# Patient Record
Sex: Female | Born: 1950
Health system: Southern US, Community
[De-identification: ages and names within clinical notes are randomized; demographics above are authoritative.]

## PROBLEM LIST (undated history)

## (undated) DIAGNOSIS — F329 Major depressive disorder, single episode, unspecified: Secondary | ICD-10-CM

## (undated) DIAGNOSIS — E785 Hyperlipidemia, unspecified: Secondary | ICD-10-CM

## (undated) DIAGNOSIS — E559 Vitamin D deficiency, unspecified: Secondary | ICD-10-CM

## (undated) DIAGNOSIS — I10 Essential (primary) hypertension: Secondary | ICD-10-CM

## (undated) DIAGNOSIS — D649 Anemia, unspecified: Secondary | ICD-10-CM

## (undated) DIAGNOSIS — F32A Depression, unspecified: Secondary | ICD-10-CM

## (undated) HISTORY — DX: Major depressive disorder, single episode, unspecified: F32.9

## (undated) HISTORY — DX: Depression, unspecified: F32.A

## (undated) HISTORY — DX: Hyperlipidemia, unspecified: E78.5

## (undated) HISTORY — DX: Anemia, unspecified: D64.9

## (undated) HISTORY — DX: Vitamin D deficiency, unspecified: E55.9

## (undated) HISTORY — DX: Essential (primary) hypertension: I10

## (undated) HISTORY — PX: APPENDECTOMY: SHX54

## (undated) HISTORY — PX: MYOMECTOMY: SHX85

---

## 1976-08-07 HISTORY — PX: UTERINE FIBROID SURGERY: SHX826

## 2009-02-27 ENCOUNTER — Emergency Department: Payer: Self-pay | Admitting: Emergency Medicine

## 2010-05-09 ENCOUNTER — Ambulatory Visit: Payer: Self-pay | Admitting: Gastroenterology

## 2011-03-29 ENCOUNTER — Ambulatory Visit: Payer: Self-pay | Admitting: Unknown Physician Specialty

## 2012-06-07 ENCOUNTER — Ambulatory Visit: Payer: Self-pay | Admitting: Physician Assistant

## 2012-10-13 ENCOUNTER — Encounter: Payer: Self-pay | Admitting: Internal Medicine

## 2012-10-14 ENCOUNTER — Ambulatory Visit (INDEPENDENT_AMBULATORY_CARE_PROVIDER_SITE_OTHER): Payer: Federal, State, Local not specified - PPO | Admitting: Internal Medicine

## 2012-10-14 ENCOUNTER — Encounter: Payer: Self-pay | Admitting: Internal Medicine

## 2012-10-14 VITALS — BP 108/70 | HR 84 | Temp 97.6°F | Ht 65.0 in | Wt 196.2 lb

## 2012-10-14 DIAGNOSIS — D649 Anemia, unspecified: Secondary | ICD-10-CM

## 2012-10-14 DIAGNOSIS — R319 Hematuria, unspecified: Secondary | ICD-10-CM

## 2012-10-14 DIAGNOSIS — I10 Essential (primary) hypertension: Secondary | ICD-10-CM | POA: Insufficient documentation

## 2012-10-14 DIAGNOSIS — Z13220 Encounter for screening for lipoid disorders: Secondary | ICD-10-CM

## 2012-10-14 DIAGNOSIS — E559 Vitamin D deficiency, unspecified: Secondary | ICD-10-CM

## 2012-10-14 MED ORDER — AMLODIPINE BESYLATE 5 MG PO TABS: 5.0000 mg | ORAL_TABLET | Freq: Every day | ORAL | Status: DC

## 2012-10-14 MED ORDER — LISINOPRIL-HYDROCHLOROTHIAZIDE 20-25 MG PO TABS: 1.0000 | ORAL_TABLET | Freq: Every day | ORAL | Status: DC

## 2012-10-14 NOTE — Assessment & Plan Note (Signed)
Iron deficiency.  Check cbc.

## 2012-10-14 NOTE — Assessment & Plan Note (Signed)
Blood pressure - good control.  Same medi regimen.  Check metabolic panel.

## 2012-10-14 NOTE — Progress Notes (Signed)
  Subjective:    Patient ID: Janice Floyd, female    DOB: 1950/08/19, 61 y.o.   MRN: [id number]  HPI 62 year old female with past history of hypertension and vitamin D deficiency who comes in today to follow up on these issues as well as to establish care.  She states she is doing well.  Stays active.  No chest pain or tightness.  Breathing stable.  No acid reflux.  No nausea or vomiting.  Bowels stable.  She exercises three times per week.  Stopped having periods - age 32.  No bleeding or spotting since. Overall she feels good.    Past Medical History  Diagnosis Date  . Hypertension   . Vitamin D deficiency   . Anemia     iron deficient    Review of Systems Patient denies any headache, lightheadedness or dizziness.  No sinus symptoms.  No chest pain, tightness or palpitations.  No increased shortness of breath, cough or congestion.  No acid reflux.  No nausea or vomiting.  No abdominal pain or cramping.  No bowel change, such as diarrhea, constipation, BRBPR or melana.  No urine change.   No vaginal problems or bleeding.  Exercising three days per week.       Objective:   Physical Exam Filed Vitals:   10/14/12 1527  BP: 108/70  Pulse: 84  Temp: 97.6 F (36.4 C)   Blood pressure recheck: 22/24  62 year old female in no acute distress.   HEENT:  Nares- clear.  Oropharynx - without lesions. NECK:  Supple.  Nontender.  No audible bruit.  HEART:  Appears to be regular. LUNGS:  No crackles or wheezing audible.  Respirations even and unlabored.  RADIAL PULSE:  Equal bilaterally. ABDOMEN:  Soft, nontender.  Bowel sounds present and normal.  No audible abdominal bruit.   EXTREMITIES:  No increased edema present.  DP pulses palpable and equal bilaterally.          Assessment & Plan:  HEALTH MAINTENANCE.  Schedule a physical next visit.  Mammogram 06/07/12 - normal.  Colonoscopy 2012.  Pneumovax 2012.  Tetanus - 2011.

## 2012-10-14 NOTE — Assessment & Plan Note (Signed)
Check urinalysis. 

## 2012-10-14 NOTE — Assessment & Plan Note (Signed)
Taking vitamin D supplements.  Check vitamin D level.

## 2012-10-24 ENCOUNTER — Other Ambulatory Visit (INDEPENDENT_AMBULATORY_CARE_PROVIDER_SITE_OTHER): Payer: Federal, State, Local not specified - PPO

## 2012-10-24 DIAGNOSIS — R319 Hematuria, unspecified: Secondary | ICD-10-CM

## 2012-10-24 DIAGNOSIS — I10 Essential (primary) hypertension: Secondary | ICD-10-CM

## 2012-10-24 DIAGNOSIS — Z13220 Encounter for screening for lipoid disorders: Secondary | ICD-10-CM

## 2012-10-24 DIAGNOSIS — E559 Vitamin D deficiency, unspecified: Secondary | ICD-10-CM

## 2012-10-24 LAB — URINALYSIS, ROUTINE W REFLEX MICROSCOPIC
Bilirubin Urine: NEGATIVE
Leukocytes, UA: NEGATIVE
Nitrite: NEGATIVE
Urobilinogen, UA: 0.2 (ref 0.0–1.0)

## 2012-10-24 LAB — CBC WITH DIFFERENTIAL/PLATELET
Basophils Relative: 0.4 % (ref 0.0–3.0)
Eosinophils Absolute: 0.4 10*3/uL (ref 0.0–0.7)
Eosinophils Relative: 6.8 % — ABNORMAL HIGH (ref 0.0–5.0)
HCT: 37.3 % (ref 36.0–46.0)
Lymphs Abs: 2 10*3/uL (ref 0.7–4.0)
MCHC: 32.7 g/dL (ref 30.0–36.0)
MCV: 84.8 fl (ref 78.0–100.0)
Monocytes Absolute: 0.5 10*3/uL (ref 0.1–1.0)
Platelets: 250 10*3/uL (ref 150.0–400.0)
RBC: 4.4 Mil/uL (ref 3.87–5.11)
WBC: 6.1 10*3/uL (ref 4.5–10.5)

## 2012-10-24 LAB — COMPREHENSIVE METABOLIC PANEL
AST: 18 U/L (ref 0–37)
Albumin: 3.8 g/dL (ref 3.5–5.2)
Alkaline Phosphatase: 50 U/L (ref 39–117)
Glucose, Bld: 91 mg/dL (ref 70–99)
Potassium: 4.5 mEq/L (ref 3.5–5.1)
Sodium: 138 mEq/L (ref 135–145)
Total Bilirubin: 1 mg/dL (ref 0.3–1.2)
Total Protein: 7.6 g/dL (ref 6.0–8.3)

## 2012-10-24 LAB — LIPID PANEL
LDL Cholesterol: 112 mg/dL — ABNORMAL HIGH (ref 0–99)
Total CHOL/HDL Ratio: 3
VLDL: 7 mg/dL (ref 0.0–40.0)

## 2012-10-26 LAB — VITAMIN D 25 HYDROXY (VIT D DEFICIENCY, FRACTURES): Vit D, 25-Hydroxy: 49 ng/mL (ref 30–89)

## 2012-10-29 ENCOUNTER — Telehealth: Payer: Self-pay | Admitting: *Deleted

## 2012-10-29 NOTE — Telephone Encounter (Signed)
Called patient to let her know the form she dropped off is ready for her to pick up. Faxed form per patients request to [employer] [facility].

## 2012-10-31 ENCOUNTER — Other Ambulatory Visit: Payer: Self-pay | Admitting: Internal Medicine

## 2012-10-31 DIAGNOSIS — R319 Hematuria, unspecified: Secondary | ICD-10-CM

## 2012-10-31 NOTE — Progress Notes (Signed)
Follow up urinalysis and culture ordered.

## 2012-11-25 ENCOUNTER — Other Ambulatory Visit (INDEPENDENT_AMBULATORY_CARE_PROVIDER_SITE_OTHER): Payer: Federal, State, Local not specified - PPO

## 2012-11-25 DIAGNOSIS — R319 Hematuria, unspecified: Secondary | ICD-10-CM

## 2012-11-25 LAB — URINALYSIS, ROUTINE W REFLEX MICROSCOPIC
Bilirubin Urine: NEGATIVE
Ketones, ur: NEGATIVE
Nitrite: NEGATIVE
Total Protein, Urine: NEGATIVE
Urine Glucose: NEGATIVE
pH: 6 (ref 5.0–8.0)

## 2012-11-27 LAB — CULTURE, URINE COMPREHENSIVE: Organism ID, Bacteria: NO GROWTH

## 2012-12-03 ENCOUNTER — Encounter: Payer: Self-pay | Admitting: *Deleted

## 2012-12-05 ENCOUNTER — Telehealth: Payer: Self-pay

## 2012-12-05 NOTE — Telephone Encounter (Signed)
Patient left message on voicemail wanting her urine results. Informed patient that her her results were negative.

## 2013-01-10 ENCOUNTER — Encounter: Payer: Self-pay | Admitting: Internal Medicine

## 2013-01-10 ENCOUNTER — Other Ambulatory Visit (HOSPITAL_COMMUNITY)
Admission: RE | Admit: 2013-01-10 | Discharge: 2013-01-10 | Disposition: A | Discharge status: Home / Self Care | Payer: Federal, State, Local not specified - PPO | Source: Ambulatory Visit | Attending: Internal Medicine | Admitting: Internal Medicine

## 2013-01-10 ENCOUNTER — Ambulatory Visit (INDEPENDENT_AMBULATORY_CARE_PROVIDER_SITE_OTHER): Payer: Federal, State, Local not specified - PPO | Admitting: Internal Medicine

## 2013-01-10 VITALS — BP 100/70 | HR 76 | Temp 98.7°F | Ht 65.0 in | Wt 192.8 lb

## 2013-01-10 DIAGNOSIS — Z01419 Encounter for gynecological examination (general) (routine) without abnormal findings: Secondary | ICD-10-CM | POA: Insufficient documentation

## 2013-01-10 DIAGNOSIS — D649 Anemia, unspecified: Secondary | ICD-10-CM

## 2013-01-10 DIAGNOSIS — R319 Hematuria, unspecified: Secondary | ICD-10-CM

## 2013-01-10 DIAGNOSIS — Z1151 Encounter for screening for human papillomavirus (HPV): Secondary | ICD-10-CM | POA: Insufficient documentation

## 2013-01-10 DIAGNOSIS — Z124 Encounter for screening for malignant neoplasm of cervix: Secondary | ICD-10-CM

## 2013-01-10 DIAGNOSIS — M8580 Other specified disorders of bone density and structure, unspecified site: Secondary | ICD-10-CM

## 2013-01-10 DIAGNOSIS — I10 Essential (primary) hypertension: Secondary | ICD-10-CM

## 2013-01-10 DIAGNOSIS — E559 Vitamin D deficiency, unspecified: Secondary | ICD-10-CM

## 2013-01-10 DIAGNOSIS — M949 Disorder of cartilage, unspecified: Secondary | ICD-10-CM

## 2013-01-10 NOTE — Progress Notes (Signed)
  Subjective:    Patient ID: Janice Floyd, female    DOB: 1951-04-21, 62 y.o.   MRN: [id number]  HPI 62 year old female with past history of hypertension and vitamin D deficiency who comes in today to follow up on these issues as well as for a complete physical exam.  She states she is doing well.  Stays active.  No chest pain or tightness.  Breathing stable.  No acid reflux.  No nausea or vomiting.  Bowels stable.  She exercises three times per week.  Stopped having periods - age 30.  No bleeding or spotting since. Overall she feels good.    Past Medical History  Diagnosis Date  . Hypertension   . Vitamin D deficiency   . Anemia     iron deficient    Current Outpatient Prescriptions on File Prior to Visit  Medication Sig Dispense Refill  . amLODipine (NORVASC) 5 MG tablet Take 1 tablet (5 mg total) by mouth daily.  30 tablet  5  . Cholecalciferol (VITAMIN D-3) 1000 UNITS CAPS Take 1 capsule by mouth daily.      Marland Kitchen lisinopril-hydrochlorothiazide (PRINZIDE,ZESTORETIC) 20-25 MG per tablet Take 1 tablet by mouth daily.  30 tablet  5   No current facility-administered medications on file prior to visit.    Review of Systems Patient denies any headache, lightheadedness or dizziness.  No sinus symptoms.  No chest pain, tightness or palpitations.  No increased shortness of breath, cough or congestion.  No acid reflux.  No nausea or vomiting.  No abdominal pain or cramping.  No bowel change, such as diarrhea, constipation, BRBPR or melana.  No urine change.   No vaginal problems or bleeding.  Exercising three days per week.  Overall feels good.      Objective:   Physical Exam  Filed Vitals:   01/10/13 0834  BP: 100/70  Pulse: 76  Temp: 98.7 F (37.1 C)   Blood pressure recheck: 110/72, pulse 29  62 year old female in no acute distress.   HEENT:  Nares- clear.  Oropharynx - without lesions. NECK:  Supple.  Nontender.  No audible bruit.  HEART:  Appears to be regular. LUNGS:  No  crackles or wheezing audible.  Respirations even and unlabored.  RADIAL PULSE:  Equal bilaterally.    BREASTS:  No nipple discharge or nipple retraction present.  Could not appreciate any distinct nodules or axillary adenopathy.  ABDOMEN:  Soft, nontender.  Bowel sounds present and normal.  No audible abdominal bruit.  GU:  Normal external genitalia.  Vaginal vault without lesions.  Cervix identified.  Pap performed. Could not appreciate any adnexal masses or tenderness.   RECTAL:  Heme negative.   EXTREMITIES:  No increased edema present.  DP pulses palpable and equal bilaterally.          Assessment & Plan:  HEALTH MAINTENANCE.  Physical today.  Mammogram 06/07/12 - normal.  Colonoscopy 2012.  Pneumovax 2012.  Tetanus - 2011.

## 2013-01-12 ENCOUNTER — Encounter: Payer: Self-pay | Admitting: Internal Medicine

## 2013-01-12 DIAGNOSIS — M8580 Other specified disorders of bone density and structure, unspecified site: Secondary | ICD-10-CM | POA: Insufficient documentation

## 2013-01-12 NOTE — Assessment & Plan Note (Signed)
Blood pressure - good control.  Same med regimen.  Follow metabolic panel.    

## 2013-01-12 NOTE — Assessment & Plan Note (Signed)
Urinalysis 11/25/12 - no blood.     

## 2013-01-12 NOTE — Assessment & Plan Note (Signed)
Bone density 2013 revealed osteopenia.  Continue calcium and vitamin D and weight bearing exercise.

## 2013-01-12 NOTE — Assessment & Plan Note (Addendum)
Taking vitamin D supplements.  Follow vitamin D level.  Vitamin D level wnl (49) - 10/24/12.

## 2013-01-12 NOTE — Assessment & Plan Note (Addendum)
Iron deficiency.  Follow cbc.  Hgb 10/24/12 - normal.

## 2013-01-15 ENCOUNTER — Encounter: Payer: Self-pay | Admitting: *Deleted

## 2013-04-15 ENCOUNTER — Telehealth: Payer: Self-pay | Admitting: Internal Medicine

## 2013-04-15 MED ORDER — AMLODIPINE BESYLATE 5 MG PO TABS: 5.0000 mg | ORAL_TABLET | Freq: Every day | ORAL | Status: DC

## 2013-04-15 MED ORDER — LISINOPRIL-HYDROCHLOROTHIAZIDE 20-25 MG PO TABS: 1.0000 | ORAL_TABLET | Freq: Every day | ORAL | Status: DC

## 2013-04-15 NOTE — Telephone Encounter (Signed)
Refilled amlodipine 5mg  #30 with 5 refills and lisinopril/hctz 220/25 #30 with 5 refills.

## 2013-06-17 ENCOUNTER — Ambulatory Visit: Payer: Self-pay | Admitting: Internal Medicine

## 2013-06-17 ENCOUNTER — Encounter: Payer: Self-pay | Admitting: Internal Medicine

## 2013-06-17 LAB — HM MAMMOGRAPHY: HM Mammogram: NEGATIVE

## 2013-06-26 ENCOUNTER — Other Ambulatory Visit: Payer: Self-pay | Admitting: Internal Medicine

## 2013-07-10 ENCOUNTER — Other Ambulatory Visit (INDEPENDENT_AMBULATORY_CARE_PROVIDER_SITE_OTHER): Payer: Federal, State, Local not specified - PPO

## 2013-07-10 DIAGNOSIS — I10 Essential (primary) hypertension: Secondary | ICD-10-CM

## 2013-07-10 LAB — BASIC METABOLIC PANEL
CO2: 31 mEq/L (ref 19–32)
Calcium: 9.4 mg/dL (ref 8.4–10.5)
Creatinine, Ser: 1 mg/dL (ref 0.4–1.2)
GFR: 71.63 mL/min (ref >60.00)

## 2013-07-10 LAB — LIPID PANEL
Total CHOL/HDL Ratio: 3
Triglycerides: 43 mg/dL (ref 0.0–149.0)

## 2013-07-15 ENCOUNTER — Encounter (INDEPENDENT_AMBULATORY_CARE_PROVIDER_SITE_OTHER): Payer: Self-pay

## 2013-07-15 ENCOUNTER — Ambulatory Visit (INDEPENDENT_AMBULATORY_CARE_PROVIDER_SITE_OTHER): Payer: Federal, State, Local not specified - PPO | Admitting: Internal Medicine

## 2013-07-15 ENCOUNTER — Encounter: Payer: Self-pay | Admitting: Internal Medicine

## 2013-07-15 VITALS — BP 110/70 | HR 79 | Temp 98.3°F | Ht 65.0 in | Wt 192.5 lb

## 2013-07-15 DIAGNOSIS — I10 Essential (primary) hypertension: Secondary | ICD-10-CM

## 2013-07-15 DIAGNOSIS — M8580 Other specified disorders of bone density and structure, unspecified site: Secondary | ICD-10-CM

## 2013-07-15 DIAGNOSIS — Z13220 Encounter for screening for lipoid disorders: Secondary | ICD-10-CM

## 2013-07-15 DIAGNOSIS — R319 Hematuria, unspecified: Secondary | ICD-10-CM

## 2013-07-15 DIAGNOSIS — D649 Anemia, unspecified: Secondary | ICD-10-CM

## 2013-07-15 DIAGNOSIS — M899 Disorder of bone, unspecified: Secondary | ICD-10-CM

## 2013-07-15 DIAGNOSIS — E559 Vitamin D deficiency, unspecified: Secondary | ICD-10-CM

## 2013-07-15 DIAGNOSIS — Z9109 Other allergy status, other than to drugs and biological substances: Secondary | ICD-10-CM

## 2013-07-15 MED ORDER — FLUTICASONE PROPIONATE 50 MCG/ACT NA SUSP: 2.0000 | Freq: Every day | NASAL | Status: DC

## 2013-07-15 NOTE — Progress Notes (Signed)
  Subjective:    Patient ID: Janice Floyd, female    DOB: 29-Jul-1951, 62 y.o.   MRN: [id number]  HPI 62 year old female with past history of hypertension and vitamin D deficiency who comes in today for a scheduled follow up.  She states she is doing well.  Stays active.  No chest pain or tightness.  Breathing stable.  No acid reflux.  No nausea or vomiting.  Bowels stable.  She exercises three times per week.  Stopped having periods - age 74.  No bleeding or spotting since. Overall she feels good.  Does report some post nasal drainage.      Past Medical History  Diagnosis Date  . Hypertension   . Vitamin D deficiency   . Anemia     iron deficient    Current Outpatient Prescriptions on File Prior to Visit  Medication Sig Dispense Refill  . amLODipine (NORVASC) 5 MG tablet Take 1 tablet (5 mg total) by mouth daily.  30 tablet  5  . Cholecalciferol (VITAMIN D-3) 1000 UNITS CAPS Take 1 capsule by mouth daily.      Marland Kitchen lisinopril-hydrochlorothiazide (PRINZIDE,ZESTORETIC) 20-25 MG per tablet Take 1 tablet by mouth daily.  30 tablet  5   No current facility-administered medications on file prior to visit.    Review of Systems Patient denies any headache, lightheadedness or dizziness.  Does report some drainage.  No chest pain, tightness or palpitations.  No increased shortness of breath, cough or congestion.  No acid reflux.  No nausea or vomiting.  No abdominal pain or cramping.  No bowel change, such as diarrhea, constipation, BRBPR or melana.  No urine change.   No vaginal problems or bleeding.  Exercising three days per week.  Overall feels good.      Objective:   Physical Exam  Filed Vitals:   07/15/13 0817  BP: 110/70  Pulse: 79  Temp: 98.3 F (36.8 C)   Pulse 49  62 year old female in no acute distress.   HEENT:  Nares- clear.  Oropharynx - without lesions. NECK:  Supple.  Nontender.  No audible bruit.  HEART:  Appears to be regular. LUNGS:  No crackles or wheezing audible.   Respirations even and unlabored.  RADIAL PULSE:  Equal bilaterally.   ABDOMEN:  Soft, nontender.  Bowel sounds present and normal.  No audible abdominal bruit.   EXTREMITIES:  No increased edema present.  DP pulses palpable and equal bilaterally.          Assessment & Plan:  HEALTH MAINTENANCE.  Physical 01/10/13..  Mammogram 06/17/13 - Birads I.  Colonoscopy 2012.  Pneumovax 2012.  Tetanus - 2011.

## 2013-07-15 NOTE — Progress Notes (Signed)
Pre-visit discussion using our clinic review tool. No additional management support is needed unless otherwise documented below in the visit note.  

## 2013-07-19 ENCOUNTER — Encounter: Payer: Self-pay | Admitting: Internal Medicine

## 2013-07-19 DIAGNOSIS — Z9109 Other allergy status, other than to drugs and biological substances: Secondary | ICD-10-CM | POA: Insufficient documentation

## 2013-07-19 NOTE — Assessment & Plan Note (Signed)
Bone density 2013 revealed osteopenia.  Continue vitamin D and weight bearing exercise.    

## 2013-07-19 NOTE — Assessment & Plan Note (Signed)
Taking vitamin D supplements.  Follow vitamin D level.  Vitamin D level wnl (49) - 10/24/12.

## 2013-07-19 NOTE — Assessment & Plan Note (Signed)
With increased drainage.  Fonase as directed.  Follow.

## 2013-07-19 NOTE — Assessment & Plan Note (Signed)
Urinalysis 11/25/12 - no blood.     

## 2013-07-19 NOTE — Assessment & Plan Note (Signed)
Blood pressure - good control.  Same med regimen.  Follow metabolic panel.    

## 2013-07-19 NOTE — Assessment & Plan Note (Signed)
Iron deficiency.  Follow cbc.  Hgb 10/24/12 - normal.

## 2013-10-10 ENCOUNTER — Other Ambulatory Visit: Payer: Self-pay | Admitting: Internal Medicine

## 2013-12-05 ENCOUNTER — Other Ambulatory Visit: Payer: Self-pay | Admitting: Internal Medicine

## 2014-01-08 ENCOUNTER — Encounter (INDEPENDENT_AMBULATORY_CARE_PROVIDER_SITE_OTHER): Payer: Self-pay

## 2014-01-08 ENCOUNTER — Other Ambulatory Visit (INDEPENDENT_AMBULATORY_CARE_PROVIDER_SITE_OTHER): Payer: Medicare Other

## 2014-01-08 DIAGNOSIS — E559 Vitamin D deficiency, unspecified: Secondary | ICD-10-CM | POA: Diagnosis not present

## 2014-01-08 DIAGNOSIS — M8580 Other specified disorders of bone density and structure, unspecified site: Secondary | ICD-10-CM

## 2014-01-08 DIAGNOSIS — I10 Essential (primary) hypertension: Secondary | ICD-10-CM | POA: Diagnosis not present

## 2014-01-08 DIAGNOSIS — M899 Disorder of bone, unspecified: Secondary | ICD-10-CM | POA: Diagnosis not present

## 2014-01-08 DIAGNOSIS — D649 Anemia, unspecified: Secondary | ICD-10-CM | POA: Diagnosis not present

## 2014-01-08 DIAGNOSIS — Z13220 Encounter for screening for lipoid disorders: Secondary | ICD-10-CM | POA: Diagnosis not present

## 2014-01-08 DIAGNOSIS — M949 Disorder of cartilage, unspecified: Secondary | ICD-10-CM

## 2014-01-08 LAB — COMPREHENSIVE METABOLIC PANEL
ALBUMIN: 3.8 g/dL (ref 3.5–5.2)
ALT: 11 U/L (ref 0–35)
AST: 18 U/L (ref 0–37)
Alkaline Phosphatase: 50 U/L (ref 39–117)
BUN: 13 mg/dL (ref 6–23)
CALCIUM: 9.5 mg/dL (ref 8.4–10.5)
CHLORIDE: 102 meq/L (ref 96–112)
CO2: 31 meq/L (ref 19–32)
Creatinine, Ser: 1.1 mg/dL (ref 0.4–1.2)
GFR: 66.15 mL/min (ref >60.00)
Glucose, Bld: 89 mg/dL (ref 70–99)
Potassium: 3.9 mEq/L (ref 3.5–5.1)
Sodium: 139 mEq/L (ref 135–145)
Total Bilirubin: 0.7 mg/dL (ref 0.2–1.2)
Total Protein: 7.6 g/dL (ref 6.0–8.3)

## 2014-01-08 LAB — CBC WITH DIFFERENTIAL/PLATELET
BASOS ABS: 0 10*3/uL (ref 0.0–0.1)
BASOS PCT: 0.7 % (ref 0.0–3.0)
Eosinophils Absolute: 0.4 10*3/uL (ref 0.0–0.7)
Eosinophils Relative: 6 % — ABNORMAL HIGH (ref 0.0–5.0)
HCT: 40.2 % (ref 36.0–46.0)
HEMOGLOBIN: 13 g/dL (ref 12.0–15.0)
LYMPHS PCT: 29.8 % (ref 12.0–46.0)
Lymphs Abs: 2.1 10*3/uL (ref 0.7–4.0)
MCHC: 32.3 g/dL (ref 30.0–36.0)
MCV: 86.6 fl (ref 78.0–100.0)
MONOS PCT: 8.3 % (ref 3.0–12.0)
Monocytes Absolute: 0.6 10*3/uL (ref 0.1–1.0)
Neutro Abs: 3.8 10*3/uL (ref 1.4–7.7)
Neutrophils Relative %: 55.2 % (ref 43.0–77.0)
Platelets: 266 10*3/uL (ref 150.0–400.0)
RBC: 4.64 Mil/uL (ref 3.87–5.11)
RDW: 15.1 % (ref 11.5–15.5)
WBC: 6.9 10*3/uL (ref 4.0–10.5)

## 2014-01-08 LAB — LIPID PANEL
CHOL/HDL RATIO: 3
Cholesterol: 189 mg/dL (ref 0–200)
HDL: 63.4 mg/dL (ref >39.00)
LDL Cholesterol: 116 mg/dL — ABNORMAL HIGH (ref 0–99)
NonHDL: 125.6
TRIGLYCERIDES: 47 mg/dL (ref 0.0–149.0)
VLDL: 9.4 mg/dL (ref 0.0–40.0)

## 2014-01-08 LAB — TSH: TSH: 1.38 u[IU]/mL (ref 0.35–4.50)

## 2014-01-09 LAB — VITAMIN D 25 HYDROXY (VIT D DEFICIENCY, FRACTURES): VIT D 25 HYDROXY: 52 ng/mL (ref 30–89)

## 2014-01-13 ENCOUNTER — Ambulatory Visit (INDEPENDENT_AMBULATORY_CARE_PROVIDER_SITE_OTHER): Payer: Medicare Other | Admitting: Internal Medicine

## 2014-01-13 ENCOUNTER — Encounter: Payer: Self-pay | Admitting: Internal Medicine

## 2014-01-13 VITALS — BP 110/60 | HR 80 | Temp 98.0°F | Ht 65.0 in | Wt 192.2 lb

## 2014-01-13 DIAGNOSIS — F439 Reaction to severe stress, unspecified: Secondary | ICD-10-CM

## 2014-01-13 DIAGNOSIS — M899 Disorder of bone, unspecified: Secondary | ICD-10-CM

## 2014-01-13 DIAGNOSIS — Z733 Stress, not elsewhere classified: Secondary | ICD-10-CM

## 2014-01-13 DIAGNOSIS — I10 Essential (primary) hypertension: Secondary | ICD-10-CM | POA: Diagnosis not present

## 2014-01-13 DIAGNOSIS — D649 Anemia, unspecified: Secondary | ICD-10-CM

## 2014-01-13 DIAGNOSIS — R319 Hematuria, unspecified: Secondary | ICD-10-CM

## 2014-01-13 DIAGNOSIS — M8580 Other specified disorders of bone density and structure, unspecified site: Secondary | ICD-10-CM

## 2014-01-13 DIAGNOSIS — E559 Vitamin D deficiency, unspecified: Secondary | ICD-10-CM | POA: Diagnosis not present

## 2014-01-13 DIAGNOSIS — Z9109 Other allergy status, other than to drugs and biological substances: Secondary | ICD-10-CM

## 2014-01-13 DIAGNOSIS — M949 Disorder of cartilage, unspecified: Secondary | ICD-10-CM

## 2014-01-13 DIAGNOSIS — Z1239 Encounter for other screening for malignant neoplasm of breast: Secondary | ICD-10-CM

## 2014-01-13 NOTE — Patient Instructions (Signed)
Ellin Saba - ( phone [phone])

## 2014-01-13 NOTE — Progress Notes (Signed)
Pre visit review using our clinic review tool, if applicable. No additional management support is needed unless otherwise documented below in the visit note. 

## 2014-01-18 ENCOUNTER — Encounter: Payer: Self-pay | Admitting: Internal Medicine

## 2014-01-18 DIAGNOSIS — F439 Reaction to severe stress, unspecified: Secondary | ICD-10-CM | POA: Insufficient documentation

## 2014-01-18 NOTE — Assessment & Plan Note (Signed)
Iron deficiency.  Follow cbc.

## 2014-01-18 NOTE — Assessment & Plan Note (Signed)
Urinalysis 11/25/12 - no blood.

## 2014-01-18 NOTE — Assessment & Plan Note (Signed)
Increased stress.  She asked for a name and number to a counselor.  Desired no further discussion today.  Gave her Donnetta Hail name and phone number.  She is to notify me if any problems.

## 2014-01-18 NOTE — Progress Notes (Signed)
  Subjective:    Patient ID: Janice Floyd, female    DOB: 20-May-1951, 63 y.o.   MRN: [id number]  HPI 63 year old female with past history of hypertension and vitamin D deficiency who comes in today to follow up on these issues as well as for a complete physical exam.   She states she is doing well.  Stays active.  No chest pain or tightness.  Breathing stable.  No acid reflux.  No nausea or vomiting.  Bowels stable.  She exercises three times per week.  Stopped having periods - age 58.  No bleeding or spotting since. Overall she feels good.       Past Medical History  Diagnosis Date  . Hypertension   . Vitamin D deficiency   . Anemia     iron deficient    Current Outpatient Prescriptions on File Prior to Visit  Medication Sig Dispense Refill  . amLODipine (NORVASC) 5 MG tablet Take 1 tablet (5 mg total) by mouth daily.  30 tablet  5  . Cholecalciferol (VITAMIN D-3) 1000 UNITS CAPS Take 1 capsule by mouth daily.      . fluticasone (FLONASE) 50 MCG/ACT nasal spray USE 2 SPRAYS IN BOTH NOSTRILS EVERY DAY  16 g  5  . lisinopril-hydrochlorothiazide (PRINZIDE,ZESTORETIC) 20-25 MG per tablet TAKE 1 TABLET BY MOUTH DAILY.  30 tablet  5   No current facility-administered medications on file prior to visit.    Review of Systems Patient denies any headache, lightheadedness or dizziness.  No significant sinus or allergy symptoms.  No chest pain, tightness or palpitations.  No increased shortness of breath, cough or congestion.  No acid reflux.  No nausea or vomiting.  No abdominal pain or cramping.  No bowel change, such as diarrhea, constipation, BRBPR or melana.  No urine change.   No vaginal problems or bleeding.  Exercising three days per week.  Overall feels good.      Objective:   Physical Exam  Filed Vitals:   01/13/14 1028  BP: 110/60  Pulse: 80  Temp: 98 F (8.33 C)   63 year old female in no acute distress.   HEENT:  Nares- clear.  Oropharynx - without lesions. NECK:  Supple.   Nontender.  No audible bruit.  HEART:  Appears to be regular. LUNGS:  No crackles or wheezing audible.  Respirations even and unlabored.  RADIAL PULSE:  Equal bilaterally.    BREASTS:  No nipple discharge or nipple retraction present.  Could not appreciate any distinct nodules or axillary adenopathy.  ABDOMEN:  Soft, nontender.  Bowel sounds present and normal.  No audible abdominal bruit.  GU:  Not performed.     EXTREMITIES:  No increased edema present.  DP pulses palpable and equal bilaterally.          Assessment & Plan:  HEALTH MAINTENANCE.  Physical today..  Mammogram 06/17/13 - Birads I.  Colonoscopy 2012.  Pneumovax 2012.  Tetanus - 2011.

## 2014-01-18 NOTE — Assessment & Plan Note (Signed)
Blood pressure - good control.  Same med regimen.  Follow metabolic panel.

## 2014-01-18 NOTE — Assessment & Plan Note (Signed)
No allergy symptoms reported today.  flonase as directed.  Follow.

## 2014-01-18 NOTE — Assessment & Plan Note (Signed)
Bone density 2013 revealed osteopenia.  Continue vitamin D and weight bearing exercise.

## 2014-01-18 NOTE — Assessment & Plan Note (Signed)
Taking vitamin D supplements.  Follow vitamin D level.  

## 2014-02-20 ENCOUNTER — Telehealth: Payer: Self-pay | Admitting: *Deleted

## 2014-02-20 MED ORDER — ZOSTER VACCINE LIVE 19400 UNT/0.65ML ~~LOC~~ SOLR: 0.6500 mL | Freq: Once | SUBCUTANEOUS | Status: DC

## 2014-02-20 NOTE — Telephone Encounter (Signed)
I will print an Rx for this. I do not see any contraindication in the chart.

## 2014-02-20 NOTE — Telephone Encounter (Signed)
Pt called requesting Zostavax Rx to be written so she may take it to her local pharmacy for administration.  Please advise in Dr Lars Mage absence

## 2014-02-20 NOTE — Telephone Encounter (Signed)
Left message on pts identified VM, Rx ready for pick up.

## 2014-02-22 ENCOUNTER — Telehealth: Payer: Self-pay | Admitting: Internal Medicine

## 2014-02-22 ENCOUNTER — Encounter (HOSPITAL_COMMUNITY): Payer: Self-pay | Admitting: Emergency Medicine

## 2014-02-22 ENCOUNTER — Emergency Department (HOSPITAL_COMMUNITY)
Admission: EM | Admit: 2014-02-22 | Discharge: 2014-02-22 | Disposition: A | Discharge status: Home / Self Care | Payer: Medicare Other | Attending: Emergency Medicine | Admitting: Emergency Medicine

## 2014-02-22 DIAGNOSIS — R45851 Suicidal ideations: Secondary | ICD-10-CM | POA: Insufficient documentation

## 2014-02-22 DIAGNOSIS — F329 Major depressive disorder, single episode, unspecified: Secondary | ICD-10-CM | POA: Diagnosis not present

## 2014-02-22 DIAGNOSIS — E559 Vitamin D deficiency, unspecified: Secondary | ICD-10-CM | POA: Insufficient documentation

## 2014-02-22 DIAGNOSIS — F3289 Other specified depressive episodes: Secondary | ICD-10-CM | POA: Insufficient documentation

## 2014-02-22 DIAGNOSIS — I10 Essential (primary) hypertension: Secondary | ICD-10-CM | POA: Insufficient documentation

## 2014-02-22 DIAGNOSIS — Z87891 Personal history of nicotine dependence: Secondary | ICD-10-CM | POA: Insufficient documentation

## 2014-02-22 DIAGNOSIS — Z862 Personal history of diseases of the blood and blood-forming organs and certain disorders involving the immune mechanism: Secondary | ICD-10-CM | POA: Diagnosis not present

## 2014-02-22 DIAGNOSIS — IMO0002 Reserved for concepts with insufficient information to code with codable children: Secondary | ICD-10-CM | POA: Insufficient documentation

## 2014-02-22 DIAGNOSIS — Z79899 Other long term (current) drug therapy: Secondary | ICD-10-CM | POA: Insufficient documentation

## 2014-02-22 DIAGNOSIS — F32A Depression, unspecified: Secondary | ICD-10-CM

## 2014-02-22 LAB — COMPREHENSIVE METABOLIC PANEL
ALT: 10 U/L (ref 0–35)
ANION GAP: 12 (ref 5–15)
AST: 19 U/L (ref 0–37)
Albumin: 3.9 g/dL (ref 3.5–5.2)
Alkaline Phosphatase: 66 U/L (ref 39–117)
BILIRUBIN TOTAL: 0.6 mg/dL (ref 0.3–1.2)
BUN: 13 mg/dL (ref 6–23)
CALCIUM: 10 mg/dL (ref 8.4–10.5)
CHLORIDE: 102 meq/L (ref 96–112)
CO2: 28 meq/L (ref 19–32)
CREATININE: 1.08 mg/dL (ref 0.50–1.10)
GFR, EST AFRICAN AMERICAN: 61 mL/min — AB (ref >90)
GFR, EST NON AFRICAN AMERICAN: 53 mL/min — AB (ref >90)
GLUCOSE: 88 mg/dL (ref 70–99)
Potassium: 4.4 mEq/L (ref 3.7–5.3)
Sodium: 142 mEq/L (ref 137–147)
Total Protein: 8.2 g/dL (ref 6.0–8.3)

## 2014-02-22 LAB — CBC
HEMATOCRIT: 40.4 % (ref 36.0–46.0)
Hemoglobin: 13 g/dL (ref 12.0–15.0)
MCH: 27.8 pg (ref 26.0–34.0)
MCHC: 32.2 g/dL (ref 30.0–36.0)
MCV: 86.3 fL (ref 78.0–100.0)
Platelets: 265 10*3/uL (ref 150–400)
RBC: 4.68 MIL/uL (ref 3.87–5.11)
RDW: 14.3 % (ref 11.5–15.5)
WBC: 7.3 10*3/uL (ref 4.0–10.5)

## 2014-02-22 LAB — RAPID URINE DRUG SCREEN, HOSP PERFORMED
Amphetamines: NOT DETECTED
Barbiturates: NOT DETECTED
Benzodiazepines: NOT DETECTED
COCAINE: NOT DETECTED
OPIATES: NOT DETECTED
Tetrahydrocannabinol: NOT DETECTED

## 2014-02-22 LAB — ETHANOL: Alcohol, Ethyl (B): <11 mg/dL (ref 0–11)

## 2014-02-22 MED ORDER — ONDANSETRON HCL 4 MG PO TABS: 4.0000 mg | ORAL_TABLET | Freq: Three times a day (TID) | ORAL | Status: DC | PRN

## 2014-02-22 MED ORDER — IBUPROFEN 200 MG PO TABS: 600.0000 mg | ORAL_TABLET | Freq: Three times a day (TID) | ORAL | Status: DC | PRN

## 2014-02-22 MED ORDER — ACETAMINOPHEN 325 MG PO TABS: 650.0000 mg | ORAL_TABLET | ORAL | Status: DC | PRN

## 2014-02-22 MED ORDER — ALUM & MAG HYDROXIDE-SIMETH 200-200-20 MG/5ML PO SUSP: 30.0000 mL | ORAL | Status: DC | PRN

## 2014-02-22 MED ORDER — LORAZEPAM 1 MG PO TABS: 1.0000 mg | ORAL_TABLET | Freq: Three times a day (TID) | ORAL | Status: DC | PRN

## 2014-02-22 MED ORDER — ZOLPIDEM TARTRATE 5 MG PO TABS: 5.0000 mg | ORAL_TABLET | Freq: Every evening | ORAL | Status: DC | PRN

## 2014-02-22 NOTE — ED Notes (Signed)
TTS at bedside. 

## 2014-02-22 NOTE — Discharge Instructions (Signed)
Use the resources provided to follow up for further evaluation and management of your symptoms.

## 2014-02-22 NOTE — ED Notes (Signed)
Pt c/o increased depression and mood swings x "a couple weeks."  Pt husband reports that the Pt stated earlier today that "she doesn't have anything to live for."  Pt reports SI w/o a plan.  Denies HI/ hallucinations.

## 2014-02-22 NOTE — Telephone Encounter (Signed)
I received a notification from the ER.  Pt seen this weekend with depression and suicidal ideations.  She spoke to counselor in [place].  Need to make sure pt doing ok and that she has f/u with psych.  Let me know if ok and if needs anything.    Thanks

## 2014-02-22 NOTE — BH Assessment (Signed)
Assessment Note  Janice Floyd is an 63 y.o. female. Patient was brought into the ED by her husband because of an increased symptoms of depression and suicide ideations no plan.  Patient is currently denying SI/HI, hallucinations, and other self-injurious behaviors.  Patient is reporting some relationship concerns with her husband of 35 years being a contributing factor in her current emotions.  Patient sent the husband a message identifying her potential failures in her marriage and to her family then suggested he would be happier with another person. Patient reports in the last 4 months her symptoms of depression increased including mood swings.  The husband reports in the past 2 months the patient was witnessed in the closet crying and this is what motivated him to get her a therapist.  Patient is currently participating with therapy with [facility] but seen the therapist for two sessions.  The husband suggest that their life is peaceful and they are not financially overwhelmed in anyway so the problems are the patient's personal feelings she would need to handle.  The patient is suggesting that they are having some marital problems and need counseling together.  The patient denies a history of treatment or symptoms of depression until 4 months ago.    CSW ran patient with Heloise Purpura, NP it is recommended to refer to outpatient provider including a psychiatrist for medication evaluation.  CSW provided the patient with other outpatient referral for medication evaluation if needed.    Axis I: Major Depression, single episode Axis II: Deferred Axis III:  Past Medical History  Diagnosis Date  . Hypertension   . Vitamin D deficiency   . Anemia     iron deficient   Axis IV: economic problems, other psychosocial or environmental problems, problems related to social environment, problems with access to health care services and problems with primary support group Axis V: 51-60 moderate  symptoms  Past Medical History:  Past Medical History  Diagnosis Date  . Hypertension   . Vitamin D deficiency   . Anemia     iron deficient    Past Surgical History  Procedure Laterality Date  . Appendectomy    . Myomectomy      x2    Family History:  Family History  Problem Relation Age of Onset  . Lung cancer Father   . Heart disease Mother     congestive heart failure  . Hypertension Mother   . Lung cancer Brother   . Diabetes Brother   . Colon polyps Sister     Social History:  reports that she has quit smoking. She has never used smokeless tobacco. She reports that she drinks alcohol. She reports that she does not use illicit drugs.  Additional Social History:     CIWA: CIWA-Ar BP: 119/79 mmHg Pulse Rate: 68 COWS:    Allergies: No Known Allergies  Home Medications:  (Not in a hospital admission)  OB/GYN Status:  Patient's last menstrual period was 10/15/1999.  General Assessment Data Location of Assessment: [facility] ED ACT Assessment: Yes Is this a Tele or Face-to-Face Assessment?: Face-to-Face Is this an Initial Assessment or a Re-assessment for this encounter?: Initial Assessment Living Arrangements: Spouse/significant other Can pt return to current living arrangement?: Yes Admission Status: Voluntary Is patient capable of signing voluntary admission?: Yes Transfer from: Home Referral Source: Self/Family/Friend  Medical Screening Exam ([place]) Medical Exam completed: Yes  [place] Living Arrangements: Spouse/significant other Name of Psychiatrist: none Name of Therapist: [facility]  Education Status Is patient currently in school?: No  Risk to self Suicidal Ideation: No-Not Currently/Within Last 6 Months Suicidal Intent: No-Not Currently/Within Last 6 Months Is patient at risk for suicide?: No Suicidal Plan?: No-Not Currently/Within Last 6 Months Access to Means: No What has been your use of  drugs/alcohol within the last 12 months?: none Previous Attempts/Gestures: No Intentional Self Injurious Behavior: None Family Suicide History: Unknown Recent stressful life event(s): Conflict (Comment);Loss (Comment);Financial Problems Persecutory voices/beliefs?: No Depression: Yes Depression Symptoms: Tearfulness;Fatigue;Isolating;Guilt;Loss of interest in usual pleasures;Feeling worthless/self pity;Feeling angry/irritable Substance abuse history and/or treatment for substance abuse?: No  Risk to Others Homicidal Ideation: No-Not Currently/Within Last 6 Months Thoughts of Harm to Others: No-Not Currently Present/Within Last 6 Months Current Homicidal Intent: No-Not Currently/Within Last 6 Months Current Homicidal Plan: No-Not Currently/Within Last 6 Months Access to Homicidal Means: No History of harm to others?: No Assessment of Violence: None Noted Does patient have access to weapons?: No Criminal Charges Pending?: No  Psychosis Hallucinations: None noted Delusions: None noted  Mental Status Report Appear/Hygiene: In hospital gown Eye Contact: Good Motor Activity: Freedom of movement Speech: Logical/coherent Level of Consciousness: Alert Mood: Depressed;Sad Affect: Depressed Anxiety Level: Minimal Thought Processes: Coherent Judgement: Unimpaired Orientation: Person;Place;Time;Situation;Appropriate for developmental age Obsessive Compulsive Thoughts/Behaviors: None  Cognitive Functioning Concentration: Fair Memory: Recent Intact;Remote Intact IQ: Average Insight: Good Impulse Control: Fair Appetite: Fair Sleep: No Change Vegetative Symptoms: None  ADLScreening [facility] Assessment Services) Patient's cognitive ability adequate to safely complete daily activities?: Yes Patient able to express need for assistance with ADLs?: Yes Independently performs ADLs?: Yes (appropriate for developmental age)  Prior Inpatient Therapy Prior Inpatient Therapy: No  Prior  Outpatient Therapy Prior Outpatient Therapy: Yes Prior Therapy Facilty/Provider(s): [facility] Reason for Treatment: Depression  ADL Screening (condition at time of admission) Patient's cognitive ability adequate to safely complete daily activities?: Yes Patient able to express need for assistance with ADLs?: Yes Independently performs ADLs?: Yes (appropriate for developmental age)         Values / Beliefs Cultural Requests During Hospitalization: None Spiritual Requests During Hospitalization: None        Additional Information 1:1 In Past 12 Months?: No CIRT Risk: No Elopement Risk: No Does patient have medical clearance?: Yes     Disposition:  Disposition Initial Assessment Completed for this Encounter: Yes Disposition of Patient: Outpatient treatment Type of outpatient treatment: Adult  On Site Evaluation by:   Reviewed with Physician:    Chesley Noon A 02/22/2014 5:41 PM

## 2014-02-22 NOTE — ED Notes (Signed)
Pt escorted to discharge window. Verbalized understanding discharge instructions. In no acute distress.   

## 2014-02-22 NOTE — ED Provider Notes (Signed)
CSN: [id number]     Arrival date & time 02/22/14  1122 History  This chart was scribed for non-physician practitioner, Alvina Chou, PA-C working with Blanchard Kelch, MD by Frederich Balding, ED scribe. This patient was seen in room WTR2/WLPT2 and the patient's care was started at 12:54 PM.   Chief Complaint  Patient presents with  . Depression  . Suicidal   The history is provided by the patient. No language interpreter was used.   HPI Comments: Janice Floyd is a 63 y.o. female who presents to the Emergency Department complaining of increased depression and suicidal ideations that started a few weeks ago. Husband states that pt said she "doesn't have anything to live for" earlier today. Denies plan. Denies history of depression. She states she is unsure why she is having these symptoms and denies any current stressors. Denies HI. Denies drug or alcohol use.  Past Medical History  Diagnosis Date  . Hypertension   . Vitamin D deficiency   . Anemia     iron deficient   Past Surgical History  Procedure Laterality Date  . Appendectomy    . Myomectomy      x2   Family History  Problem Relation Age of Onset  . Lung cancer Father   . Heart disease Mother     congestive heart failure  . Hypertension Mother   . Lung cancer Brother   . Diabetes Brother   . Colon polyps Sister    History  Substance Use Topics  . Smoking status: Former Research scientist (life sciences)  . Smokeless tobacco: Never Used     Comment: quit smoking 1990  . Alcohol Use: Yes     Comment: glass of wine per day   OB History   Grav Para Term Preterm Abortions TAB SAB Ect Mult Living                 Review of Systems  Psychiatric/Behavioral: Positive for suicidal ideas and dysphoric mood.  All other systems reviewed and are negative.  Allergies  Review of patient's allergies indicates no known allergies.  Home Medications   Prior to Admission medications   Medication Sig Start Date End Date Taking? Authorizing Provider   amLODipine (NORVASC) 5 MG tablet Take 1 tablet (5 mg total) by mouth daily. 04/15/13  Yes Alisa Graff, MD  Cholecalciferol (VITAMIN D-3) 1000 UNITS CAPS Take 1,000 Units by mouth daily.    Yes Historical Provider, MD  fluticasone (FLONASE) 50 MCG/ACT nasal spray Place 1 spray into both nostrils as needed for allergies or rhinitis.   Yes Historical Provider, MD  lisinopril-hydrochlorothiazide (PRINZIDE,ZESTORETIC) 20-25 MG per tablet TAKE 1 TABLET BY MOUTH DAILY. 12/05/13  Yes Alisa Graff, MD  vitamin E 400 UNIT capsule Take 400 Units by mouth daily.   Yes Historical Provider, MD  zoster vaccine live, PF, (ZOSTAVAX) 40981 UNT/0.65ML injection Inject 19,400 Units into the skin once. 02/20/14  Yes Jackolyn Confer, MD   BP 130/82  Pulse 74  Temp(Src) 98.7 F (37.1 C) (Oral)  Resp 16  SpO2 100%  LMP 10/15/1999  Physical Exam  Nursing note and vitals reviewed. Constitutional: She is oriented to person, place, and time. She appears well-developed and well-nourished. No distress.  HENT:  Head: Normocephalic and atraumatic.  Eyes: Conjunctivae and EOM are normal.  Neck: Neck supple. No tracheal deviation present.  Cardiovascular: Normal rate.   Pulmonary/Chest: Effort normal. No respiratory distress.  Musculoskeletal: Normal range of motion.  Neurological: She is alert and  oriented to person, place, and time.  Skin: Skin is warm and dry.  Psychiatric: She has a normal mood and affect. Her behavior is normal. She expresses suicidal ideation. She expresses no homicidal ideation. She expresses no suicidal plans.    ED Course  Procedures (including critical care time)  DIAGNOSTIC STUDIES: Oxygen Saturation is 100% on RA, normal by my interpretation.    COORDINATION OF CARE: 12:55 PM-Discussed treatment plan which includes blood work and speaking with a counselor with pt at bedside and pt agreed to plan.   Labs Review Labs Reviewed  COMPREHENSIVE METABOLIC PANEL - Abnormal;  Notable for the following:    GFR calc non Af Amer 53 (*)    GFR calc Af Amer 61 (*)    All other components within normal limits  CBC  URINE RAPID DRUG SCREEN (HOSP PERFORMED)  ETHANOL    Imaging Review No results found.   EKG Interpretation None      MDM   Final diagnoses:  Suicidal ideation    Patient's labs unremarkable for acute changes. Patient is medically cleared for TTS evaluation. Vitals stable and patient afebrile.   I personally performed the services described in this documentation, which was scribed in my presence. The recorded information has been reviewed and is accurate.  Alvina Chou, PA-C 02/22/14 1603

## 2014-02-23 NOTE — Telephone Encounter (Signed)
Order placed for referral to psychiatry.  Please notify her that Janice Floyd should be contacting her with an appt.  Let us know if any problems of if she needs anything.

## 2014-02-23 NOTE — Telephone Encounter (Signed)
Spoke with pt, "much improved" since her ED visit. Denies any concerns today, states feeling much better. She has follow up with [place] [facility] and her counselor, but would like referral to psychiatrist, female in [place], because no referral was made in the ED.

## 2014-02-23 NOTE — Telephone Encounter (Signed)
Pt aware.

## 2014-02-23 NOTE — ED Provider Notes (Signed)
Medical screening examination/treatment/procedure(s) were performed by non-physician practitioner and as supervising physician I was immediately available for consultation/collaboration.   EKG Interpretation None        Blanchard Kelch, MD 02/23/14 1712

## 2014-04-30 DIAGNOSIS — F4323 Adjustment disorder with mixed anxiety and depressed mood: Secondary | ICD-10-CM | POA: Diagnosis not present

## 2014-06-05 ENCOUNTER — Other Ambulatory Visit: Payer: Self-pay | Admitting: Internal Medicine

## 2014-06-18 ENCOUNTER — Ambulatory Visit: Payer: Self-pay | Admitting: Internal Medicine

## 2014-06-18 DIAGNOSIS — Z1231 Encounter for screening mammogram for malignant neoplasm of breast: Secondary | ICD-10-CM | POA: Diagnosis not present

## 2014-06-18 LAB — HM MAMMOGRAPHY: HM MAMMO: NEGATIVE

## 2014-06-19 ENCOUNTER — Encounter: Payer: Self-pay | Admitting: *Deleted

## 2014-06-24 ENCOUNTER — Telehealth: Payer: Self-pay | Admitting: Internal Medicine

## 2014-06-24 NOTE — Telephone Encounter (Signed)
left msg to call office to reschedule appt 11/18.msn

## 2014-06-26 NOTE — Telephone Encounter (Signed)
left msg to call office to reschedule appt 11/20.msn

## 2014-06-29 ENCOUNTER — Ambulatory Visit (INDEPENDENT_AMBULATORY_CARE_PROVIDER_SITE_OTHER): Payer: Medicare Other | Admitting: Internal Medicine

## 2014-06-29 ENCOUNTER — Ambulatory Visit (INDEPENDENT_AMBULATORY_CARE_PROVIDER_SITE_OTHER): Payer: Medicare Other | Admitting: *Deleted

## 2014-06-29 ENCOUNTER — Encounter: Payer: Self-pay | Admitting: Internal Medicine

## 2014-06-29 VITALS — BP 120/80 | HR 69 | Temp 98.9°F | Ht 65.0 in | Wt 189.0 lb

## 2014-06-29 DIAGNOSIS — E669 Obesity, unspecified: Secondary | ICD-10-CM

## 2014-06-29 DIAGNOSIS — Z23 Encounter for immunization: Secondary | ICD-10-CM | POA: Diagnosis not present

## 2014-06-29 DIAGNOSIS — I10 Essential (primary) hypertension: Secondary | ICD-10-CM | POA: Diagnosis not present

## 2014-06-29 DIAGNOSIS — R319 Hematuria, unspecified: Secondary | ICD-10-CM

## 2014-06-29 DIAGNOSIS — Z658 Other specified problems related to psychosocial circumstances: Secondary | ICD-10-CM

## 2014-06-29 DIAGNOSIS — F439 Reaction to severe stress, unspecified: Secondary | ICD-10-CM

## 2014-06-29 DIAGNOSIS — D649 Anemia, unspecified: Secondary | ICD-10-CM | POA: Diagnosis not present

## 2014-06-29 DIAGNOSIS — E559 Vitamin D deficiency, unspecified: Secondary | ICD-10-CM

## 2014-06-29 NOTE — Progress Notes (Signed)
Pre visit review using our clinic review tool, if applicable. No additional management support is needed unless otherwise documented below in the visit note. 

## 2014-07-03 ENCOUNTER — Other Ambulatory Visit: Payer: Self-pay | Admitting: Internal Medicine

## 2014-07-04 ENCOUNTER — Encounter: Payer: Self-pay | Admitting: Internal Medicine

## 2014-07-04 DIAGNOSIS — Z6833 Body mass index (BMI) 33.0-33.9, adult: Secondary | ICD-10-CM | POA: Insufficient documentation

## 2014-07-04 NOTE — Progress Notes (Signed)
  Subjective:    Patient ID: Janice Floyd, female    DOB: Jun 01, 1951, 63 y.o.   MRN: [id number]  HPI 63 year old female with past history of hypertension and vitamin D deficiency who comes in today for a scheduled follow up.  She states she is doing relatively well.  Stays active.  No chest pain or tightness.  Breathing stable.  No acid reflux.  No nausea or vomiting.  Bowels stable.  Still with increased stress.  She states she feels she is doing relatively well.  Discussed counseling and referral.        Past Medical History  Diagnosis Date  . Hypertension   . Vitamin D deficiency   . Anemia     iron deficient    Current Outpatient Prescriptions on File Prior to Visit  Medication Sig Dispense Refill  . Cholecalciferol (VITAMIN D-3) 1000 UNITS CAPS Take 1,000 Units by mouth daily.     . fluticasone (FLONASE) 50 MCG/ACT nasal spray Place 1 spray into both nostrils as needed for allergies or rhinitis.    Marland Kitchen lisinopril-hydrochlorothiazide (PRINZIDE,ZESTORETIC) 20-25 MG per tablet TAKE 1 TABLET BY MOUTH DAILY. 30 tablet 0  . vitamin E 400 UNIT capsule Take 400 Units by mouth daily.    Marland Kitchen zoster vaccine live, PF, (ZOSTAVAX) 79390 UNT/0.65ML injection Inject 19,400 Units into the skin once. 1 each 0   No current facility-administered medications on file prior to visit.    Review of Systems Patient denies any headache, lightheadedness or dizziness.  No sinus or allergy symptoms.  No chest pain, tightness or palpitations.  No increased shortness of breath, cough or congestion.  No acid reflux.  No nausea or vomiting.  No abdominal pain or cramping.  No bowel change, such as diarrhea, constipation, BRBPR or melana.  No urine change.   No vaginal problems or bleeding. Increased stress.      Objective:   Physical Exam  Filed Vitals:   06/29/14 1552  BP: 120/80  Pulse: 69  Temp: 98.9 F (13.18 C)   63 year old female in no acute distress.   HEENT:  Nares- clear.  Oropharynx - without  lesions. NECK:  Supple.  Nontender.  No audible bruit.  HEART:  Appears to be regular. LUNGS:  No crackles or wheezing audible.  Respirations even and unlabored.  RADIAL PULSE:  Equal bilaterally. ABDOMEN:  Soft, nontender.  Bowel sounds present and normal.  No audible abdominal bruit.     EXTREMITIES:  No increased edema present.  DP pulses palpable and equal bilaterally.          Assessment & Plan:  1. Obesity (BMI 30-39.9) Diet and exercse.    2. Essential hypertension, benign Blood pressure doing well.  Same medication regimen.  - Comprehensive metabolic panel; Future  3. Vitamin D deficiency Vitamin D level 52 - 01/2014.    4. Anemia, unspecified anemia type hgb wnl 02/2014 check.  Has a history of iron deficiency.   5. Hematuria 11/2012 urinalysis negative.    6. Stress Discussed with her today.  Discussed counseling.  She desires no further intervention.  Follow.   HEALTH MAINTENANCE.  Physical 01/13/14..  Mammogram 06/17/13 - Birads I.  Schedule f/u mammogram.  Colonoscopy 2012.  Pneumovax 2012.  Tetanus - 2011.

## 2014-07-14 ENCOUNTER — Other Ambulatory Visit: Payer: Federal, State, Local not specified - PPO

## 2014-07-15 ENCOUNTER — Ambulatory Visit: Payer: Federal, State, Local not specified - PPO | Admitting: Internal Medicine

## 2014-07-21 ENCOUNTER — Other Ambulatory Visit (INDEPENDENT_AMBULATORY_CARE_PROVIDER_SITE_OTHER): Payer: Medicare Other

## 2014-07-21 ENCOUNTER — Ambulatory Visit (INDEPENDENT_AMBULATORY_CARE_PROVIDER_SITE_OTHER): Payer: Medicare Other | Admitting: *Deleted

## 2014-07-21 DIAGNOSIS — I10 Essential (primary) hypertension: Secondary | ICD-10-CM

## 2014-07-21 DIAGNOSIS — Z23 Encounter for immunization: Secondary | ICD-10-CM

## 2014-07-21 LAB — LIPID PANEL
CHOLESTEROL: 205 mg/dL — AB (ref 0–200)
HDL: 62.3 mg/dL (ref >39.00)
LDL CALC: 131 mg/dL — AB (ref 0–99)
NONHDL: 142.7
Total CHOL/HDL Ratio: 3
Triglycerides: 57 mg/dL (ref 0.0–149.0)
VLDL: 11.4 mg/dL (ref 0.0–40.0)

## 2014-07-21 LAB — COMPREHENSIVE METABOLIC PANEL
ALBUMIN: 4.1 g/dL (ref 3.5–5.2)
ALT: 8 U/L (ref 0–35)
AST: 17 U/L (ref 0–37)
Alkaline Phosphatase: 52 U/L (ref 39–117)
BILIRUBIN TOTAL: 0.8 mg/dL (ref 0.2–1.2)
BUN: 16 mg/dL (ref 6–23)
CO2: 28 meq/L (ref 19–32)
Calcium: 9.7 mg/dL (ref 8.4–10.5)
Chloride: 102 mEq/L (ref 96–112)
Creatinine, Ser: 1.1 mg/dL (ref 0.4–1.2)
GFR: 66.04 mL/min (ref >60.00)
GLUCOSE: 90 mg/dL (ref 70–99)
POTASSIUM: 4.2 meq/L (ref 3.5–5.1)
SODIUM: 136 meq/L (ref 135–145)
TOTAL PROTEIN: 7.8 g/dL (ref 6.0–8.3)

## 2014-07-22 ENCOUNTER — Encounter: Payer: Self-pay | Admitting: *Deleted

## 2014-08-05 ENCOUNTER — Other Ambulatory Visit: Payer: Self-pay | Admitting: *Deleted

## 2014-08-05 MED ORDER — LISINOPRIL-HYDROCHLOROTHIAZIDE 20-25 MG PO TABS: 1.0000 | ORAL_TABLET | Freq: Every day | ORAL | Status: DC

## 2014-12-29 ENCOUNTER — Ambulatory Visit (INDEPENDENT_AMBULATORY_CARE_PROVIDER_SITE_OTHER): Payer: Medicare Other | Admitting: Internal Medicine

## 2014-12-29 ENCOUNTER — Encounter: Payer: Self-pay | Admitting: Internal Medicine

## 2014-12-29 VITALS — BP 100/70 | HR 77 | Temp 97.7°F | Ht 65.5 in | Wt 185.0 lb

## 2014-12-29 DIAGNOSIS — D649 Anemia, unspecified: Secondary | ICD-10-CM

## 2014-12-29 DIAGNOSIS — M8580 Other specified disorders of bone density and structure, unspecified site: Secondary | ICD-10-CM

## 2014-12-29 DIAGNOSIS — I10 Essential (primary) hypertension: Secondary | ICD-10-CM

## 2014-12-29 DIAGNOSIS — E7800 Pure hypercholesterolemia, unspecified: Secondary | ICD-10-CM | POA: Insufficient documentation

## 2014-12-29 DIAGNOSIS — Z658 Other specified problems related to psychosocial circumstances: Secondary | ICD-10-CM

## 2014-12-29 DIAGNOSIS — Z0000 Encounter for general adult medical examination without abnormal findings: Secondary | ICD-10-CM

## 2014-12-29 DIAGNOSIS — E559 Vitamin D deficiency, unspecified: Secondary | ICD-10-CM

## 2014-12-29 DIAGNOSIS — E669 Obesity, unspecified: Secondary | ICD-10-CM

## 2014-12-29 DIAGNOSIS — F439 Reaction to severe stress, unspecified: Secondary | ICD-10-CM

## 2014-12-29 MED ORDER — LISINOPRIL-HYDROCHLOROTHIAZIDE 20-12.5 MG PO TABS: 1.0000 | ORAL_TABLET | Freq: Every day | ORAL | Status: DC

## 2014-12-29 NOTE — Assessment & Plan Note (Signed)
Low cholesterol diet and exercise.  Check lipid panel with next labs.

## 2014-12-29 NOTE — Assessment & Plan Note (Signed)
Better.  Desires no further intervention.

## 2014-12-29 NOTE — Assessment & Plan Note (Signed)
Weight bearing exercise.  Check vitamin D level with next labs.

## 2014-12-29 NOTE — Progress Notes (Signed)
Patient ID: Janice Floyd, female   DOB: Jan 21, 1951, 64 y.o.   MRN: [id number]   Subjective:    Patient ID: Janice Floyd, female    DOB: February 17, 1951, 64 y.o.   MRN: [id number]  HPI  Patient here to follow up on her current medical issues as well as for a complete physical exam.  Doing well.  Stress is better.  Feels good.  No cardiac symptoms with increased activity or exertion.  Breathing stable.  No increased allergy issues.  Bowels stable.  Has lost weight.  Plans to do more exercise.     Past Medical History  Diagnosis Date  . Hypertension   . Vitamin D deficiency   . Anemia     iron deficient    Current Outpatient Prescriptions on File Prior to Visit  Medication Sig Dispense Refill  . Cholecalciferol (VITAMIN D-3) 1000 UNITS CAPS Take 1,000 Units by mouth daily.     . fluticasone (FLONASE) 50 MCG/ACT nasal spray Place 1 spray into both nostrils as needed for allergies or rhinitis.    Marland Kitchen vitamin E 400 UNIT capsule Take 400 Units by mouth daily.     No current facility-administered medications on file prior to visit.    Review of Systems  Constitutional: Negative for appetite change and unexpected weight change.  HENT: Negative for congestion and sinus pressure.   Eyes: Negative for pain and visual disturbance.  Respiratory: Negative for cough, chest tightness and shortness of breath.   Cardiovascular: Negative for chest pain, palpitations and leg swelling.  Gastrointestinal: Negative for nausea, vomiting, abdominal pain and diarrhea.  Genitourinary: Negative for dysuria and difficulty urinating.  Musculoskeletal: Negative for back pain and joint swelling.  Skin: Negative for color change and rash.  Neurological: Negative for dizziness, light-headedness and headaches.  Hematological: Negative for adenopathy. Does not bruise/bleed easily.  Psychiatric/Behavioral: Negative for dysphoric mood and agitation.       Objective:     Blood pressure recheck:  102/62  Physical  Exam  Constitutional: She is oriented to person, place, and time. She appears well-developed and well-nourished.  HENT:  Nose: Nose normal.  Mouth/Throat: Oropharynx is clear and moist.  Eyes: Right eye exhibits no discharge. Left eye exhibits no discharge. No scleral icterus.  Neck: Neck supple. No thyromegaly present.  Cardiovascular: Normal rate and regular rhythm.   Pulmonary/Chest: Breath sounds normal. No accessory muscle usage. No tachypnea. No respiratory distress. She has no decreased breath sounds. She has no wheezes. She has no rhonchi. Right breast exhibits no inverted nipple, no mass, no nipple discharge and no tenderness (no axillary adenopathy). Left breast exhibits no inverted nipple, no mass, no nipple discharge and no tenderness (no axilarry adenopathy).  Abdominal: Soft. Bowel sounds are normal. There is no tenderness.  Musculoskeletal: She exhibits no edema or tenderness.  Lymphadenopathy:    She has no cervical adenopathy.  Neurological: She is alert and oriented to person, place, and time.  Skin: Skin is warm. No rash noted.  Psychiatric: She has a normal mood and affect. Her behavior is normal.    BP 100/70 mmHg  Pulse 77  Temp(Src) 97.7 F (36.5 C) (Oral)  Ht 5' 5.5" (1.664 m)  Wt 185 lb (83.915 kg)  BMI 30.31 kg/m2  SpO2 96%  LMP 10/15/1999 Wt Readings from Last 3 Encounters:  12/29/14 185 lb (83.915 kg)  06/29/14 189 lb (85.73 kg)  01/13/14 192 lb 4 oz (87.204 kg)     Lab Results  Component Value  Date   WBC 7.3 02/22/2014   HGB 13.0 02/22/2014   HCT 40.4 02/22/2014   PLT 265 02/22/2014   GLUCOSE 90 07/21/2014   CHOL 205* 07/21/2014   TRIG 57.0 07/21/2014   HDL 62.30 07/21/2014   LDLCALC 131* 07/21/2014   ALT 8 07/21/2014   AST 17 07/21/2014   NA 136 07/21/2014   K 4.2 07/21/2014   CL 102 07/21/2014   CREATININE 1.1 07/21/2014   BUN 16 07/21/2014   CO2 28 07/21/2014   TSH 1.38 01/08/2014       Assessment & Plan:   Problem List Items  Addressed This Visit    Anemia    Has a history of iron deficiency. Check cbc with next labs.       Relevant Orders   CBC with Differential/Platelet   Essential hypertension, benign - Primary    Blood pressure as outlined.  Low 100s on check today.  No dizziness.  Will change lisinopril/hctz to 20/12.5 q day.  Follow pressures.  Get her back in to reassess.  She wants to complete her current supply of medication before starting.        Relevant Medications   lisinopril-hydrochlorothiazide (ZESTORETIC) 20-12.5 MG per tablet   Other Relevant Orders   Comprehensive metabolic panel   TSH   Health care maintenance    Physical 12/29/14.  Mammogram 06/18/14 - Birads I.  PAP 01/10/13 - negative with negative HPV.  Colonoscopy 2012.        Hypercholesterolemia    Low cholesterol diet and exercise.  Check lipid panel with next labs.       Relevant Medications   lisinopril-hydrochlorothiazide (ZESTORETIC) 20-12.5 MG per tablet   Other Relevant Orders   Lipid panel   Obesity (BMI 30-39.9)    Diet and exercise.       Osteopenia    Weight bearing exercise.  Check vitamin D level with next labs.        Stress    Better.  Desires no further intervention.       Vitamin D deficiency    Check vitamin D level with next labs.       Relevant Orders   Vit D  25 hydroxy (rtn osteoporosis monitoring)       Einar Pheasant, MD

## 2014-12-29 NOTE — Assessment & Plan Note (Signed)
Diet and exercise.   

## 2014-12-29 NOTE — Assessment & Plan Note (Signed)
Has a history of iron deficiency. Check cbc with next labs.

## 2014-12-29 NOTE — Assessment & Plan Note (Signed)
Blood pressure as outlined.  Low 100s on check today.  No dizziness.  Will change lisinopril/hctz to 20/12.5 q day.  Follow pressures.  Get her back in to reassess.  She wants to complete her current supply of medication before starting.

## 2014-12-29 NOTE — Progress Notes (Signed)
Pre visit review using our clinic review tool, if applicable. No additional management support is needed unless otherwise documented below in the visit note. 

## 2014-12-30 ENCOUNTER — Encounter: Payer: Self-pay | Admitting: Internal Medicine

## 2014-12-30 DIAGNOSIS — Z0000 Encounter for general adult medical examination without abnormal findings: Secondary | ICD-10-CM | POA: Insufficient documentation

## 2014-12-30 NOTE — Assessment & Plan Note (Signed)
Check vitamin D level with next labs.  ?

## 2014-12-30 NOTE — Assessment & Plan Note (Signed)
Physical 12/29/14.  Mammogram 06/18/14 - Birads I.  PAP 01/10/13 - negative with negative HPV.  Colonoscopy 2012.

## 2015-01-20 ENCOUNTER — Other Ambulatory Visit: Payer: Federal, State, Local not specified - PPO

## 2015-01-22 ENCOUNTER — Other Ambulatory Visit (INDEPENDENT_AMBULATORY_CARE_PROVIDER_SITE_OTHER): Payer: Medicare Other

## 2015-01-22 DIAGNOSIS — E7800 Pure hypercholesterolemia, unspecified: Secondary | ICD-10-CM

## 2015-01-22 DIAGNOSIS — I10 Essential (primary) hypertension: Secondary | ICD-10-CM | POA: Diagnosis not present

## 2015-01-22 DIAGNOSIS — E559 Vitamin D deficiency, unspecified: Secondary | ICD-10-CM

## 2015-01-22 DIAGNOSIS — D649 Anemia, unspecified: Secondary | ICD-10-CM

## 2015-01-22 LAB — LIPID PANEL
CHOL/HDL RATIO: 3
Cholesterol: 174 mg/dL (ref 0–200)
HDL: 66.8 mg/dL (ref >39.00)
LDL Cholesterol: 99 mg/dL (ref 0–99)
NonHDL: 107.2
Triglycerides: 40 mg/dL (ref 0.0–149.0)
VLDL: 8 mg/dL (ref 0.0–40.0)

## 2015-01-22 LAB — COMPREHENSIVE METABOLIC PANEL
ALT: 9 U/L (ref 0–35)
AST: 17 U/L (ref 0–37)
Albumin: 3.9 g/dL (ref 3.5–5.2)
Alkaline Phosphatase: 50 U/L (ref 39–117)
BUN: 14 mg/dL (ref 6–23)
CALCIUM: 9.4 mg/dL (ref 8.4–10.5)
CHLORIDE: 102 meq/L (ref 96–112)
CO2: 28 meq/L (ref 19–32)
CREATININE: 1.07 mg/dL (ref 0.40–1.20)
GFR: 65.94 mL/min (ref >60.00)
Glucose, Bld: 85 mg/dL (ref 70–99)
Potassium: 4 mEq/L (ref 3.5–5.1)
Sodium: 136 mEq/L (ref 135–145)
TOTAL PROTEIN: 7.2 g/dL (ref 6.0–8.3)
Total Bilirubin: 0.7 mg/dL (ref 0.2–1.2)

## 2015-01-22 LAB — CBC WITH DIFFERENTIAL/PLATELET
Basophils Absolute: 0 10*3/uL (ref 0.0–0.1)
Basophils Relative: 0.5 % (ref 0.0–3.0)
EOS ABS: 0.3 10*3/uL (ref 0.0–0.7)
EOS PCT: 5.3 % — AB (ref 0.0–5.0)
HEMATOCRIT: 37.9 % (ref 36.0–46.0)
HEMOGLOBIN: 12.4 g/dL (ref 12.0–15.0)
LYMPHS PCT: 26.6 % (ref 12.0–46.0)
Lymphs Abs: 1.5 10*3/uL (ref 0.7–4.0)
MCHC: 32.6 g/dL (ref 30.0–36.0)
MCV: 84.9 fl (ref 78.0–100.0)
Monocytes Absolute: 0.5 10*3/uL (ref 0.1–1.0)
Monocytes Relative: 9.4 % (ref 3.0–12.0)
NEUTROS PCT: 58.2 % (ref 43.0–77.0)
Neutro Abs: 3.2 10*3/uL (ref 1.4–7.7)
PLATELETS: 237 10*3/uL (ref 150.0–400.0)
RBC: 4.46 Mil/uL (ref 3.87–5.11)
RDW: 15.4 % (ref 11.5–15.5)
WBC: 5.6 10*3/uL (ref 4.0–10.5)

## 2015-01-22 LAB — TSH: TSH: 0.88 u[IU]/mL (ref 0.35–4.50)

## 2015-01-22 LAB — VITAMIN D 25 HYDROXY (VIT D DEFICIENCY, FRACTURES): VITD: 43.05 ng/mL (ref 30.00–100.00)

## 2015-01-25 ENCOUNTER — Encounter: Payer: Self-pay | Admitting: *Deleted

## 2015-03-19 ENCOUNTER — Other Ambulatory Visit: Payer: Self-pay | Admitting: Internal Medicine

## 2015-05-03 ENCOUNTER — Ambulatory Visit (INDEPENDENT_AMBULATORY_CARE_PROVIDER_SITE_OTHER): Payer: Medicare Other | Admitting: Internal Medicine

## 2015-05-03 ENCOUNTER — Encounter: Payer: Self-pay | Admitting: Internal Medicine

## 2015-05-03 VITALS — BP 110/70 | HR 62 | Temp 98.0°F | Resp 17 | Ht 65.5 in | Wt 186.0 lb

## 2015-05-03 DIAGNOSIS — E559 Vitamin D deficiency, unspecified: Secondary | ICD-10-CM

## 2015-05-03 DIAGNOSIS — D649 Anemia, unspecified: Secondary | ICD-10-CM

## 2015-05-03 DIAGNOSIS — Z1239 Encounter for other screening for malignant neoplasm of breast: Secondary | ICD-10-CM

## 2015-05-03 DIAGNOSIS — I10 Essential (primary) hypertension: Secondary | ICD-10-CM

## 2015-05-03 DIAGNOSIS — E7800 Pure hypercholesterolemia, unspecified: Secondary | ICD-10-CM

## 2015-05-03 DIAGNOSIS — F439 Reaction to severe stress, unspecified: Secondary | ICD-10-CM

## 2015-05-03 DIAGNOSIS — M8580 Other specified disorders of bone density and structure, unspecified site: Secondary | ICD-10-CM

## 2015-05-03 DIAGNOSIS — Z658 Other specified problems related to psychosocial circumstances: Secondary | ICD-10-CM

## 2015-05-03 DIAGNOSIS — Z23 Encounter for immunization: Secondary | ICD-10-CM | POA: Diagnosis not present

## 2015-05-03 DIAGNOSIS — E669 Obesity, unspecified: Secondary | ICD-10-CM

## 2015-05-03 LAB — COMPREHENSIVE METABOLIC PANEL
ALBUMIN: 3.9 g/dL (ref 3.5–5.2)
ALT: 8 U/L (ref 0–35)
AST: 16 U/L (ref 0–37)
Alkaline Phosphatase: 47 U/L (ref 39–117)
BUN: 19 mg/dL (ref 6–23)
CHLORIDE: 103 meq/L (ref 96–112)
CO2: 27 mEq/L (ref 19–32)
Calcium: 9.5 mg/dL (ref 8.4–10.5)
Creatinine, Ser: 1.06 mg/dL (ref 0.40–1.20)
GFR: 66.6 mL/min (ref >60.00)
Glucose, Bld: 78 mg/dL (ref 70–99)
Potassium: 4.1 mEq/L (ref 3.5–5.1)
SODIUM: 139 meq/L (ref 135–145)
Total Bilirubin: 0.7 mg/dL (ref 0.2–1.2)
Total Protein: 7.2 g/dL (ref 6.0–8.3)

## 2015-05-03 LAB — URINALYSIS, ROUTINE W REFLEX MICROSCOPIC
BILIRUBIN URINE: NEGATIVE
Ketones, ur: NEGATIVE
Leukocytes, UA: NEGATIVE
NITRITE: NEGATIVE
Specific Gravity, Urine: >=1.03 — AB (ref 1.000–1.030)
TOTAL PROTEIN, URINE-UPE24: NEGATIVE
URINE GLUCOSE: NEGATIVE
UROBILINOGEN UA: 0.2 (ref 0.0–1.0)
pH: 6 (ref 5.0–8.0)

## 2015-05-03 LAB — LIPID PANEL
CHOLESTEROL: 176 mg/dL (ref 0–200)
HDL: 65.6 mg/dL (ref >39.00)
LDL CALC: 103 mg/dL — AB (ref 0–99)
NonHDL: 110.15
Total CHOL/HDL Ratio: 3
Triglycerides: 38 mg/dL (ref 0.0–149.0)
VLDL: 7.6 mg/dL (ref 0.0–40.0)

## 2015-05-03 MED ORDER — LISINOPRIL-HYDROCHLOROTHIAZIDE 10-12.5 MG PO TABS: 1.0000 | ORAL_TABLET | Freq: Every day | ORAL | Status: DC

## 2015-05-03 MED ORDER — LISINOPRIL-HYDROCHLOROTHIAZIDE 20-12.5 MG PO TABS: 1.0000 | ORAL_TABLET | Freq: Every day | ORAL | Status: DC

## 2015-05-03 NOTE — Assessment & Plan Note (Signed)
Discussed f/u bone density today.  Will notify me when to schedule.

## 2015-05-03 NOTE — Assessment & Plan Note (Signed)
hgb check 01/22/15 - wnl.

## 2015-05-03 NOTE — Patient Instructions (Addendum)
Influenza Virus Vaccine injection (Fluarix) What is this medicine? INFLUENZA VIRUS VACCINE (in floo EN zuh VAHY ruhs vak SEEN) helps to reduce the risk of getting influenza also known as the flu. This medicine may be used for other purposes; ask your health care provider or pharmacist if you have questions. COMMON BRAND NAME(S): Fluarix, Fluzone What should I tell my health care provider before I take this medicine? They need to know if you have any of these conditions: -bleeding disorder like hemophilia -fever or infection -Guillain-Barre syndrome or other neurological problems -immune system problems -infection with the human immunodeficiency virus (HIV) or AIDS -low blood platelet counts -multiple sclerosis -an unusual or allergic reaction to influenza virus vaccine, eggs, chicken proteins, latex, gentamicin, other medicines, foods, dyes or preservatives -pregnant or trying to get pregnant -breast-feeding How should I use this medicine? This vaccine is for injection into a muscle. It is given by a health care professional. A copy of Vaccine Information Statements will be given before each vaccination. Read this sheet carefully each time. The sheet may change frequently. Talk to your pediatrician regarding the use of this medicine in children. Special care may be needed. Overdosage: If you think you have taken too much of this medicine contact a poison control center or emergency room at once. NOTE: This medicine is only for you. Do not share this medicine with others. What if I miss a dose? This does not apply. What may interact with this medicine? -chemotherapy or radiation therapy -medicines that lower your immune system like etanercept, anakinra, infliximab, and adalimumab -medicines that treat or prevent blood clots like warfarin -phenytoin -steroid medicines like prednisone or cortisone -theophylline -vaccines This list may not describe all possible interactions. Give your  health care provider a list of all the medicines, herbs, non-prescription drugs, or dietary supplements you use. Also tell them if you smoke, drink alcohol, or use illegal drugs. Some items may interact with your medicine. What should I watch for while using this medicine? Report any side effects that do not go away within 3 days to your doctor or health care professional. Call your health care provider if any unusual symptoms occur within 6 weeks of receiving this vaccine. You may still catch the flu, but the illness is not usually as bad. You cannot get the flu from the vaccine. The vaccine will not protect against colds or other illnesses that may cause fever. The vaccine is needed every year. What side effects may I notice from receiving this medicine? Side effects that you should report to your doctor or health care professional as soon as possible: -allergic reactions like skin rash, itching or hives, swelling of the face, lips, or tongue Side effects that usually do not require medical attention (report to your doctor or health care professional if they continue or are bothersome): -fever -headache -muscle aches and pains -pain, tenderness, redness, or swelling at site where injected -weak or tired This list may not describe all possible side effects. Call your doctor for medical advice about side effects. You may report side effects to FDA at 1-800-FDA-1088. Where should I keep my medicine? This vaccine is only given in a clinic, pharmacy, doctor's office, or other health care setting and will not be stored at home. NOTE: This sheet is a summary. It may not cover all possible information. If you have questions about this medicine, talk to your doctor, pharmacist, or health care provider.  2015, [employer]vier/Gold Standard. (2008-02-19 09:30:40)   Stop the lisinopril/hctz  20/12.5 and start lisinopril 10/12.5 - one per day.

## 2015-05-03 NOTE — Progress Notes (Signed)
Pre-visit discussion using our clinic review tool. No additional management support is needed unless otherwise documented below in the visit note.  

## 2015-05-03 NOTE — Assessment & Plan Note (Signed)
Vitamin D level 01/22/15 wnl (43).

## 2015-05-03 NOTE — Assessment & Plan Note (Signed)
Cholesterol improved on last check.  Discussed diet and exercise.  Recheck liver panel today.

## 2015-05-03 NOTE — Progress Notes (Signed)
Patient ID: Janice Floyd, female   DOB: 03/19/1951, 64 y.o.   MRN: [id number]   Subjective:    Patient ID: Janice Floyd, female    DOB: 1951/03/01, 64 y.o.   MRN: [id number]  HPI  Patient with past history of hypertension, stress and hypercholesterolemia.  Comes in today to follow up on these issues.   States her blood pressure has been averaging 110/70.  We decreased the dose slightly - last visit.  She has done well.  No headache, light headedness or dizziness.  Exercising.  No cardiac symptoms with increased activity or exertion.  No sob.  No acid reflux.  No abdominal pain.  Bowels doing well.  Stress is better.     Past Medical History  Diagnosis Date  . Hypertension   . Vitamin D deficiency   . Anemia     iron deficient   Past Surgical History  Procedure Laterality Date  . Appendectomy    . Myomectomy      x2   Family History  Problem Relation Age of Onset  . Lung cancer Father   . Heart disease Mother     congestive heart failure  . Hypertension Mother   . Lung cancer Brother   . Diabetes Brother   . Colon polyps Sister    Social History   Social History  . Marital Status: Married    Spouse Name: N/A  . Number of Children: N/A  . Years of Education: N/A   Social History Main Topics  . Smoking status: Former Research scientist (life sciences)  . Smokeless tobacco: Never Used     Comment: quit smoking 1990  . Alcohol Use: 0.0 oz/week    0 Standard drinks or equivalent per week     Comment: glass of wine per day  . Drug Use: No  . Sexual Activity: Not Asked   Other Topics Concern  . None   Social History Narrative    Outpatient Encounter Prescriptions as of 05/03/2015  Medication Sig  . Cholecalciferol (VITAMIN D-3) 1000 UNITS CAPS Take 1,000 Units by mouth daily.   . fluticasone (FLONASE) 50 MCG/ACT nasal spray Place 1 spray into both nostrils as needed for allergies or rhinitis.  Marland Kitchen vitamin E 400 UNIT capsule Take 400 Units by mouth daily.  . [DISCONTINUED]  lisinopril-hydrochlorothiazide (PRINZIDE,ZESTORETIC) 20-12.5 MG per tablet TAKE 1 TABLET BY MOUTH DAILY.  . [DISCONTINUED] lisinopril-hydrochlorothiazide (PRINZIDE,ZESTORETIC) 20-12.5 MG per tablet Take 1 tablet by mouth daily.  Marland Kitchen lisinopril-hydrochlorothiazide (PRINZIDE,ZESTORETIC) 10-12.5 MG per tablet Take 1 tablet by mouth daily.   No facility-administered encounter medications on file as of 05/03/2015.    Review of Systems  Constitutional: Negative for appetite change and unexpected weight change.  HENT: Negative for congestion and sinus pressure.   Respiratory: Negative for cough, chest tightness and shortness of breath.   Cardiovascular: Negative for chest pain, palpitations and leg swelling.  Gastrointestinal: Negative for nausea, vomiting, abdominal pain and diarrhea.  Genitourinary: Negative for dysuria and difficulty urinating.  Musculoskeletal: Negative for back pain and joint swelling.  Skin: Negative for color change and rash.  Neurological: Negative for dizziness, light-headedness and headaches.  Psychiatric/Behavioral: Negative for dysphoric mood and agitation.       Stress is better.        Objective:     Blood pressure rechecked by me:  108/78, pulse 64  Physical Exam  Constitutional: She appears well-developed and well-nourished. No distress.  HENT:  Nose: Nose normal.  Mouth/Throat: Oropharynx is clear and moist.  Eyes: Conjunctivae are normal. Right eye exhibits no discharge. Left eye exhibits no discharge.  Neck: Neck supple. No thyromegaly present.  Cardiovascular: Normal rate and regular rhythm.   Pulmonary/Chest: Breath sounds normal. No respiratory distress. She has no wheezes.  Abdominal: Soft. Bowel sounds are normal. There is no tenderness.  Musculoskeletal: She exhibits no edema or tenderness.  Lymphadenopathy:    She has no cervical adenopathy.  Skin: No rash noted. No erythema.  Psychiatric: She has a normal mood and affect. Her behavior is  normal.    BP 110/70 mmHg  Pulse 62  Temp(Src) 98 F (36.7 C) (Oral)  Resp 17  Ht 5' 5.5" (1.664 m)  Wt 186 lb (84.369 kg)  BMI 30.47 kg/m2  SpO2 98%  LMP 10/15/1999 Wt Readings from Last 3 Encounters:  05/03/15 186 lb (84.369 kg)  12/29/14 185 lb (83.915 kg)  06/29/14 189 lb (85.73 kg)     Lab Results  Component Value Date   WBC 5.6 01/22/2015   HGB 12.4 01/22/2015   HCT 37.9 01/22/2015   PLT 237.0 01/22/2015   GLUCOSE 85 01/22/2015   CHOL 174 01/22/2015   TRIG 40.0 01/22/2015   HDL 66.80 01/22/2015   LDLCALC 99 01/22/2015   ALT 9 01/22/2015   AST 17 01/22/2015   NA 136 01/22/2015   K 4.0 01/22/2015   CL 102 01/22/2015   CREATININE 1.07 01/22/2015   BUN 14 01/22/2015   CO2 28 01/22/2015   TSH 0.88 01/22/2015       Assessment & Plan:   Problem List Items Addressed This Visit    Anemia    hgb check 01/22/15 - wnl.       Essential hypertension, benign    Blood pressure as outlined.  Decreased dose last visit.  Will decrease to lisinopril/hctz 10/12.5 q day.  Follow pressures.  Check metabolic panel.  Check urinalysis today.       Relevant Medications   lisinopril-hydrochlorothiazide (PRINZIDE,ZESTORETIC) 10-12.5 MG per tablet   Other Relevant Orders   Comprehensive metabolic panel   Urinalysis, Routine w reflex microscopic (not at [facility])   Hypercholesterolemia    Cholesterol improved on last check.  Discussed diet and exercise.  Recheck liver panel today.       Relevant Medications   lisinopril-hydrochlorothiazide (PRINZIDE,ZESTORETIC) 10-12.5 MG per tablet   Other Relevant Orders   Lipid panel   Obesity (BMI 30-39.9)    Diet and exercise.  Follow.        Osteopenia    Discussed f/u bone density today.  Will notify me when to schedule.        Stress    Doing better.  Follow.        Vitamin D deficiency    Vitamin D level 01/22/15 wnl (43).         Other Visit Diagnoses    Screening breast examination    -  Primary    Relevant Orders     MM DIGITAL SCREENING BILATERAL    Encounter for immunization            Einar Pheasant, MD

## 2015-05-03 NOTE — Assessment & Plan Note (Signed)
Doing better.  Follow.   

## 2015-05-03 NOTE — Assessment & Plan Note (Addendum)
Blood pressure as outlined.  Decreased dose last visit.  Will decrease to lisinopril/hctz 10/12.5 q day.  Follow pressures.  Check metabolic panel.  Check urinalysis today.

## 2015-05-03 NOTE — Assessment & Plan Note (Signed)
Diet and exercise.  Follow.  

## 2015-05-04 ENCOUNTER — Encounter: Payer: Self-pay | Admitting: *Deleted

## 2015-06-23 ENCOUNTER — Ambulatory Visit
Admission: RE | Admit: 2015-06-23 | Discharge: 2015-06-23 | Disposition: A | Discharge status: Home / Self Care | Payer: Medicare Other | Source: Ambulatory Visit | Attending: Internal Medicine | Admitting: Internal Medicine

## 2015-06-23 ENCOUNTER — Other Ambulatory Visit: Payer: Self-pay | Admitting: Internal Medicine

## 2015-06-23 DIAGNOSIS — Z1239 Encounter for other screening for malignant neoplasm of breast: Secondary | ICD-10-CM

## 2015-06-23 DIAGNOSIS — H2513 Age-related nuclear cataract, bilateral: Secondary | ICD-10-CM | POA: Diagnosis not present

## 2015-06-23 DIAGNOSIS — Z1231 Encounter for screening mammogram for malignant neoplasm of breast: Secondary | ICD-10-CM | POA: Diagnosis not present

## 2015-07-07 ENCOUNTER — Ambulatory Visit (INDEPENDENT_AMBULATORY_CARE_PROVIDER_SITE_OTHER): Payer: Medicare Other | Admitting: Family Medicine

## 2015-07-07 ENCOUNTER — Encounter: Payer: Self-pay | Admitting: Family Medicine

## 2015-07-07 VITALS — BP 110/72 | HR 102 | Temp 99.3°F | Ht 65.5 in | Wt 188.2 lb

## 2015-07-07 DIAGNOSIS — J111 Influenza due to unidentified influenza virus with other respiratory manifestations: Secondary | ICD-10-CM | POA: Diagnosis not present

## 2015-07-07 DIAGNOSIS — R69 Illness, unspecified: Principal | ICD-10-CM

## 2015-07-07 LAB — POCT INFLUENZA A/B: INFLUENZA A, POC: NEGATIVE

## 2015-07-07 NOTE — Assessment & Plan Note (Signed)
Acute uncomplicated illness. New problem. Rapid flu negative today. Exam unrevealing. Advised supportive care and use of Tylenol/Motrin for fever.

## 2015-07-07 NOTE — Progress Notes (Signed)
   Subjective:  Patient ID: Janice Floyd, female    DOB: 1950/08/09  Age: 64 y.o. MRN: [id number]  CC: Fever, chills, body aches  HPI:  64 year old female with a history of hypertension presents to clinic today with the above complaints.  Patient reports that she developed fever, chills, and body aches yesterday. She states that she has had a fever of 102 which she reports spontaneously improved.  She took an over-the-counter antihistamine for her symptoms and has had little relief. In addition to her symptoms she reports a tickle in the back of her throat. She denies any cough, runny nose, shortness of breath. No known sick contacts. No exacerbating or relieving factors.  Social Hx   Social History   Social History  . Marital Status: Married    Spouse Name: N/A  . Number of Children: N/A  . Years of Education: N/A   Social History Main Topics  . Smoking status: Former Research scientist (life sciences)  . Smokeless tobacco: Never Used     Comment: quit smoking 1990  . Alcohol Use: 0.0 oz/week    0 Standard drinks or equivalent per week     Comment: glass of wine per day  . Drug Use: No  . Sexual Activity: Not Asked   Other Topics Concern  . None   Social History Narrative   Review of Systems  Constitutional: Positive for fever and chills.  Respiratory: Negative for cough and shortness of breath.    Objective:  BP 110/72 mmHg  Pulse 102  Temp(Src) 99.3 F (37.4 C) (Oral)  Ht 5' 5.5" (1.664 m)  Wt 188 lb 4 oz (85.39 kg)  BMI 30.84 kg/m2  SpO2 96%  LMP 10/15/1999  BP/Weight 07/07/2015 05/03/2015 [id number]  Systolic BP [id number]  Diastolic BP 72 70 70  Wt. (Lbs) 188.25 186 185  BMI 30.84 30.47 30.31   Physical Exam  Constitutional: No distress.  HENT:  Head: Normocephalic and atraumatic.  Mouth/Throat: Oropharynx is clear and moist.  Normal TM's bilaterally.  Eyes: Conjunctivae are normal.  Neck: Neck supple.  Cardiovascular: Normal rate and regular rhythm.   Pulmonary/Chest:  Effort normal and breath sounds normal. She has no wheezes. She has no rales.  Neurological: She is alert.  Psychiatric: She has a normal mood and affect.  Vitals reviewed.  Lab Results  Component Value Date   WBC 5.6 01/22/2015   HGB 12.4 01/22/2015   HCT 37.9 01/22/2015   PLT 237.0 01/22/2015   GLUCOSE 78 05/03/2015   CHOL 176 05/03/2015   TRIG 38.0 05/03/2015   HDL 65.60 05/03/2015   LDLCALC 103* 05/03/2015   ALT 8 05/03/2015   AST 16 05/03/2015   NA 139 05/03/2015   K 4.1 05/03/2015   CL 103 05/03/2015   CREATININE 1.06 05/03/2015   BUN 19 05/03/2015   CO2 27 05/03/2015   TSH 0.88 01/22/2015    Assessment & Plan:   Problem List Items Addressed This Visit    Influenza-like illness - Primary    Acute uncomplicated illness. New problem. Rapid flu negative today. Exam unrevealing. Advised supportive care and use of Tylenol/Motrin for fever.        Relevant Orders   POCT Influenza A/B (Completed)     Follow-up: No Follow-up on file.  Ross

## 2015-07-07 NOTE — Patient Instructions (Addendum)
Your exam was negative as was your flu test.  Use tylenol and motrin for your aches and pains.  Call if you worsen or fail to improve.  Take care  Dr. Lacinda Axon

## 2015-07-07 NOTE — Progress Notes (Signed)
Pre visit review using our clinic review tool, if applicable. No additional management support is needed unless otherwise documented below in the visit note. 

## 2015-07-15 ENCOUNTER — Encounter
Admission: RE | Disposition: A | Discharge status: Home / Self Care | Payer: Self-pay | Source: Ambulatory Visit | Attending: Gastroenterology

## 2015-07-15 ENCOUNTER — Encounter: Payer: Self-pay | Admitting: *Deleted

## 2015-07-15 ENCOUNTER — Ambulatory Visit: Payer: Medicare Other | Admitting: *Deleted

## 2015-07-15 ENCOUNTER — Ambulatory Visit
Admission: RE | Admit: 2015-07-15 | Discharge: 2015-07-15 | Disposition: A | Discharge status: Home / Self Care | Payer: Medicare Other | Source: Ambulatory Visit | Attending: Gastroenterology | Admitting: Gastroenterology

## 2015-07-15 DIAGNOSIS — I10 Essential (primary) hypertension: Secondary | ICD-10-CM | POA: Diagnosis not present

## 2015-07-15 DIAGNOSIS — K5730 Diverticulosis of large intestine without perforation or abscess without bleeding: Secondary | ICD-10-CM | POA: Diagnosis not present

## 2015-07-15 DIAGNOSIS — Z87891 Personal history of nicotine dependence: Secondary | ICD-10-CM | POA: Insufficient documentation

## 2015-07-15 DIAGNOSIS — Z7951 Long term (current) use of inhaled steroids: Secondary | ICD-10-CM | POA: Insufficient documentation

## 2015-07-15 DIAGNOSIS — Z1211 Encounter for screening for malignant neoplasm of colon: Secondary | ICD-10-CM | POA: Diagnosis not present

## 2015-07-15 DIAGNOSIS — Z8371 Family history of colonic polyps: Secondary | ICD-10-CM | POA: Diagnosis not present

## 2015-07-15 DIAGNOSIS — E559 Vitamin D deficiency, unspecified: Secondary | ICD-10-CM | POA: Insufficient documentation

## 2015-07-15 DIAGNOSIS — Z79899 Other long term (current) drug therapy: Secondary | ICD-10-CM | POA: Diagnosis not present

## 2015-07-15 DIAGNOSIS — D128 Benign neoplasm of rectum: Secondary | ICD-10-CM | POA: Diagnosis not present

## 2015-07-15 DIAGNOSIS — K635 Polyp of colon: Secondary | ICD-10-CM | POA: Diagnosis not present

## 2015-07-15 DIAGNOSIS — D127 Benign neoplasm of rectosigmoid junction: Secondary | ICD-10-CM | POA: Diagnosis not present

## 2015-07-15 DIAGNOSIS — K5790 Diverticulosis of intestine, part unspecified, without perforation or abscess without bleeding: Secondary | ICD-10-CM | POA: Diagnosis not present

## 2015-07-15 HISTORY — PX: COLONOSCOPY WITH PROPOFOL: SHX5780

## 2015-07-15 LAB — HM COLONOSCOPY

## 2015-07-15 SURGERY — COLONOSCOPY WITH PROPOFOL
Anesthesia: General

## 2015-07-15 MED ORDER — SODIUM CHLORIDE 0.9 % IV SOLN
INTRAVENOUS | Status: DC
  Administered 2015-07-15: 1000 mL via INTRAVENOUS

## 2015-07-15 MED ORDER — PROPOFOL 10 MG/ML IV BOLUS
INTRAVENOUS | Status: DC | PRN
  Administered 2015-07-15 (×6): 20 mg via INTRAVENOUS

## 2015-07-15 MED ORDER — LACTATED RINGERS IV SOLN
INTRAVENOUS | Status: DC | PRN
  Administered 2015-07-15: 14:00:00 via INTRAVENOUS

## 2015-07-15 MED ORDER — SODIUM CHLORIDE 0.9 % IV SOLN
INTRAVENOUS | Status: DC
Start: 2015-07-15 — End: 2015-07-15

## 2015-07-15 NOTE — Transfer of Care (Signed)
Immediate Anesthesia Transfer of Care Note  Patient: Inda Castle  Procedure(s) Performed: Procedure(s): COLONOSCOPY WITH PROPOFOL (N/A)  Patient Location: PACU  Anesthesia Type:General  Level of Consciousness: awake, alert  and oriented  Airway & Oxygen Therapy: Patient Spontanous Breathing and Patient connected to nasal cannula oxygen  Post-op Assessment: Report given to RN and Post -op Vital signs reviewed and stable  Post vital signs: Reviewed and stable  Last Vitals:  Filed Vitals:   07/15/15 1343  BP: 134/81  Pulse: 70  Temp: 36.5 C  Resp: 18    Complications: No apparent anesthesia complications

## 2015-07-15 NOTE — Op Note (Signed)
[facility] Patient Name: Janice Floyd Procedure Date: 07/15/2015 2:13 PM MRN: [id number] Account #: [id number] Date of Birth: 11-10-1950 Admit Type: Outpatient Age: 64 Room: [facility] ENDO ROOM 4 Gender: Female Note Status: Finalized Procedure:         Colonoscopy Indications:       Personal history of colonic polyps Providers:         Lupita Dawn. Candace Cruise, MD Referring MD:      Einar Pheasant, MD (Referring MD) Medicines:         Monitored Anesthesia Care Complications:     No immediate complications. Procedure:         Pre-Anesthesia Assessment:                    - Prior to the procedure, a History and Physical was                     performed, and patient medications, allergies and                     sensitivities were reviewed. The patient's tolerance of                     previous anesthesia was reviewed.                    - The risks and benefits of the procedure and the sedation                     options and risks were discussed with the patient. All                     questions were answered and informed consent was obtained.                    - After reviewing the risks and benefits, the patient was                     deemed in satisfactory condition to undergo the procedure.                    After obtaining informed consent, the colonoscope was                     passed under direct vision. Throughout the procedure, the                     patient's blood pressure, pulse, and oxygen saturations                     were monitored continuously. The Colonoscope was                     introduced through the anus and advanced to the the cecum,                     identified by appendiceal orifice and ileocecal valve. The                     colonoscopy was performed without difficulty. The patient                     tolerated the procedure well. The quality of the bowel  preparation was good. Findings:      Three sessile  polyps were found in the recto-sigmoid colon. The polyps       were diminutive in size. These polyps were removed with a jumbo cold       forceps. Resection and retrieval were complete.      A few small and large-mouthed diverticula were found in the entire colon.      The exam was otherwise without abnormality. Impression:        - Three diminutive polyps at the recto-sigmoid colon.                     Resected and retrieved.                    - Diverticulosis in the entire examined colon.                    - The examination was otherwise normal. Recommendation:    - Discharge patient to home.                    - Await pathology results.                    - Repeat colonoscopy in 5 years for surveillance based on                     pathology results.                    - The findings and recommendations were discussed with the                     patient. Procedure Code(s): --- Professional ---                    [phone], Colonoscopy, flexible; with biopsy, single or                     multiple Diagnosis Code(s): --- Professional ---                    D12.7, Benign neoplasm of rectosigmoid junction                    Z86.010, Personal history of colonic polyps                    K57.30, Diverticulosis of large intestine without                     perforation or abscess without bleeding CPT copyright 2014 American Medical Association. All rights reserved. The codes documented in this report are preliminary and upon coder review may  be revised to meet current compliance requirements. Hulen Luster, MD 07/15/2015 2:43:20 PM This report has been signed electronically. Number of Addenda: 0 Note Initiated On: 07/15/2015 2:13 PM Scope Withdrawal Time: 0 hours 8 minutes 23 seconds  Total Procedure Duration: 0 hours 15 minutes 46 seconds       [facility]

## 2015-07-15 NOTE — Anesthesia Preprocedure Evaluation (Signed)
Anesthesia Evaluation  Patient identified by MRN, date of birth, ID band Patient awake    Reviewed: Allergy & Precautions, H&P , NPO status , Patient's Chart, lab work & pertinent test results  History of Anesthesia Complications Negative for: history of anesthetic complications  Airway Mallampati: II  TM Distance: >3 FB Neck ROM: full    Dental no notable dental hx. (+) Teeth Intact   Pulmonary neg shortness of breath, former smoker,    Pulmonary exam normal breath sounds clear to auscultation       Cardiovascular Exercise Tolerance: Good hypertension, (-) angina(-) Past MI and (-) DOE Normal cardiovascular exam Rhythm:regular Rate:Normal     Neuro/Psych negative neurological ROS  negative psych ROS   GI/Hepatic negative GI ROS, Neg liver ROS,   Endo/Other  negative endocrine ROS  Renal/GU negative Renal ROS  negative genitourinary   Musculoskeletal   Abdominal   Peds  Hematology negative hematology ROS (+)   Anesthesia Other Findings Past Medical History:   Hypertension                                                 Vitamin D deficiency                                         Anemia                                                         Comment:iron deficient  Past Surgical History:   APPENDECTOMY                                                  MYOMECTOMY                                                      Comment:x2  BMI    Body Mass Index   31.45 kg/m 2      Reproductive/Obstetrics negative OB ROS                             Anesthesia Physical Anesthesia Plan  ASA: III  Anesthesia Plan: General   Post-op Pain Management:    Induction:   Airway Management Planned:   Additional Equipment:   Intra-op Plan:   Post-operative Plan:   Informed Consent: I have reviewed the patients History and Physical, chart, labs and discussed the procedure including the  risks, benefits and alternatives for the proposed anesthesia with the patient or authorized representative who has indicated his/her understanding and acceptance.   Dental Advisory Given  Plan Discussed with: Anesthesiologist, CRNA and Surgeon  Anesthesia Plan Comments:         Anesthesia Quick Evaluation

## 2015-07-15 NOTE — H&P (Signed)
    Primary Care Physician:  Einar Pheasant, MD Primary Gastroenterologist:  Dr. Candace Cruise  Pre-Procedure History & Physical: HPI:  Janice Floyd is a 64 y.o. female is here for an colonoscopy.   Past Medical History  Diagnosis Date  . Hypertension   . Vitamin D deficiency   . Anemia     iron deficient    Past Surgical History  Procedure Laterality Date  . Appendectomy    . Myomectomy      x2    Prior to Admission medications   Medication Sig Start Date End Date Taking? Authorizing Provider  Cholecalciferol (VITAMIN D-3) 1000 UNITS CAPS Take 1,000 Units by mouth daily.    Yes Historical Provider, MD  fluticasone (FLONASE) 50 MCG/ACT nasal spray Place 1 spray into both nostrils as needed for allergies or rhinitis.   Yes Historical Provider, MD  lisinopril-hydrochlorothiazide (PRINZIDE,ZESTORETIC) 10-12.5 MG per tablet Take 1 tablet by mouth daily. 05/03/15  Yes Einar Pheasant, MD  vitamin E 400 UNIT capsule Take 400 Units by mouth daily.   Yes Historical Provider, MD    Allergies as of 07/14/2015  . (No Known Allergies)    Family History  Problem Relation Age of Onset  . Lung cancer Father   . Heart disease Mother     congestive heart failure  . Hypertension Mother   . Lung cancer Brother   . Diabetes Brother   . Colon polyps Sister     Social History   Social History  . Marital Status: Married    Spouse Name: N/A  . Number of Children: N/A  . Years of Education: N/A   Occupational History  . Not on file.   Social History Main Topics  . Smoking status: Former Research scientist (life sciences)  . Smokeless tobacco: Never Used     Comment: quit smoking 1990  . Alcohol Use: 0.0 oz/week    0 Standard drinks or equivalent per week     Comment: glass of wine per day  . Drug Use: No  . Sexual Activity: Not on file   Other Topics Concern  . Not on file   Social History Narrative    Review of Systems: See HPI, otherwise negative ROS  Physical Exam: BP 134/81 mmHg  Pulse 70   Temp(Src) 97.7 F (36.5 C) (Tympanic)  Resp 18  Ht 5\' 5"  (1.651 m)  Wt 85.73 kg (189 lb)  BMI 31.45 kg/m2  SpO2 100%  LMP 10/15/1999 General:   Alert,  pleasant and cooperative in NAD Head:  Normocephalic and atraumatic. Neck:  Supple; no masses or thyromegaly. Lungs:  Clear throughout to auscultation.    Heart:  Regular rate and rhythm. Abdomen:  Soft, nontender and nondistended. Normal bowel sounds, without guarding, and without rebound.   Neurologic:  Alert and  oriented x4;  grossly normal neurologically.  Impression/Plan: Janice Floyd is here for an colonoscopy to be performed for personal hx of colon polyps.  Risks, benefits, limitations, and alternatives regarding colonoscopy have been reviewed with the patient.  Questions have been answered.  All parties agreeable.   Elise Knobloch, Lupita Dawn, MD  07/15/2015, 2:44 PM

## 2015-07-15 NOTE — Anesthesia Postprocedure Evaluation (Signed)
Anesthesia Post Note  Patient: Janice Floyd  Procedure(s) Performed: Procedure(s) (LRB): COLONOSCOPY WITH PROPOFOL (N/A)  Patient location during evaluation: Endoscopy Anesthesia Type: General Level of consciousness: awake and alert Pain management: pain level controlled Vital Signs Assessment: post-procedure vital signs reviewed and stable Respiratory status: spontaneous breathing, nonlabored ventilation, respiratory function stable and patient connected to nasal cannula oxygen Cardiovascular status: blood pressure returned to baseline and stable Postop Assessment: no signs of nausea or vomiting Anesthetic complications: no    Last Vitals:  Filed Vitals:   07/15/15 1455 07/15/15 1505  BP: 116/75 106/91  Pulse: 80 83  Temp:    Resp: 8 14    Last Pain:  Filed Vitals:   07/15/15 1511  PainSc: 0-No pain                 Precious Haws Curstin Schmale

## 2015-07-17 ENCOUNTER — Encounter: Payer: Self-pay | Admitting: Gastroenterology

## 2015-07-19 ENCOUNTER — Encounter: Payer: Self-pay | Admitting: Internal Medicine

## 2015-07-19 DIAGNOSIS — Z86010 Personal history of colonic polyps: Secondary | ICD-10-CM | POA: Insufficient documentation

## 2015-07-19 LAB — SURGICAL PATHOLOGY

## 2015-07-27 ENCOUNTER — Encounter: Payer: Self-pay | Admitting: Internal Medicine

## 2015-08-24 ENCOUNTER — Other Ambulatory Visit: Payer: Self-pay

## 2015-08-24 MED ORDER — LISINOPRIL-HYDROCHLOROTHIAZIDE 10-12.5 MG PO TABS: 1.0000 | ORAL_TABLET | Freq: Every day | ORAL | Status: DC

## 2015-09-01 ENCOUNTER — Ambulatory Visit: Payer: Federal, State, Local not specified - PPO | Admitting: Internal Medicine

## 2015-11-04 ENCOUNTER — Encounter: Payer: Self-pay | Admitting: Internal Medicine

## 2015-11-04 ENCOUNTER — Ambulatory Visit (INDEPENDENT_AMBULATORY_CARE_PROVIDER_SITE_OTHER): Payer: Medicare Other | Admitting: Internal Medicine

## 2015-11-04 VITALS — BP 120/70 | HR 74 | Temp 97.6°F | Resp 18 | Ht 65.5 in | Wt 186.4 lb

## 2015-11-04 DIAGNOSIS — E7800 Pure hypercholesterolemia, unspecified: Secondary | ICD-10-CM | POA: Diagnosis not present

## 2015-11-04 DIAGNOSIS — I10 Essential (primary) hypertension: Secondary | ICD-10-CM

## 2015-11-04 DIAGNOSIS — E669 Obesity, unspecified: Secondary | ICD-10-CM

## 2015-11-04 DIAGNOSIS — F439 Reaction to severe stress, unspecified: Secondary | ICD-10-CM

## 2015-11-04 DIAGNOSIS — Z658 Other specified problems related to psychosocial circumstances: Secondary | ICD-10-CM | POA: Diagnosis not present

## 2015-11-04 DIAGNOSIS — Z86010 Personal history of colonic polyps: Secondary | ICD-10-CM

## 2015-11-04 LAB — COMPREHENSIVE METABOLIC PANEL
ALBUMIN: 4 g/dL (ref 3.5–5.2)
ALK PHOS: 49 U/L (ref 39–117)
ALT: 10 U/L (ref 0–35)
AST: 16 U/L (ref 0–37)
BILIRUBIN TOTAL: 0.6 mg/dL (ref 0.2–1.2)
BUN: 14 mg/dL (ref 6–23)
CALCIUM: 9.6 mg/dL (ref 8.4–10.5)
CO2: 31 mEq/L (ref 19–32)
CREATININE: 1.07 mg/dL (ref 0.40–1.20)
Chloride: 101 mEq/L (ref 96–112)
GFR: 65.78 mL/min (ref >60.00)
Glucose, Bld: 88 mg/dL (ref 70–99)
Potassium: 4.3 mEq/L (ref 3.5–5.1)
Sodium: 137 mEq/L (ref 135–145)
Total Protein: 7.5 g/dL (ref 6.0–8.3)

## 2015-11-04 LAB — LIPID PANEL
CHOL/HDL RATIO: 3
CHOLESTEROL: 178 mg/dL (ref 0–200)
HDL: 59.3 mg/dL (ref >39.00)
LDL Cholesterol: 111 mg/dL — ABNORMAL HIGH (ref 0–99)
NonHDL: 119.17
TRIGLYCERIDES: 43 mg/dL (ref 0.0–149.0)
VLDL: 8.6 mg/dL (ref 0.0–40.0)

## 2015-11-04 NOTE — Progress Notes (Signed)
Pre-visit discussion using our clinic review tool. No additional management support is needed unless otherwise documented below in the visit note.  

## 2015-11-04 NOTE — Progress Notes (Signed)
Patient ID: Janice Floyd, female   DOB: Sep 09, 1950, 65 y.o.   MRN: [id number]   Subjective:    Patient ID: Janice Floyd, female    DOB: February 23, 1951, 65 y.o.   MRN: [id number]  HPI  Patient here for a scheduled follow up.  She is doing well.  Feels good.  Taking the lisinopril/hctz 10/12.5 and doing well.  States her blood pressure averages 120/70s.  Stays active.  No cardiac symptoms with increased activity or exertion.  No sob.  No acid reflux.  No abdominal pain or cramping.  Bowels stable.     Past Medical History  Diagnosis Date  . Hypertension   . Vitamin D deficiency   . Anemia     iron deficient   Past Surgical History  Procedure Laterality Date  . Appendectomy    . Myomectomy      x2  . Colonoscopy with propofol N/A 07/15/2015    Procedure: COLONOSCOPY WITH PROPOFOL;  Surgeon: Hulen Luster, MD;  Location: [facility] ENDOSCOPY;  Service: Gastroenterology;  Laterality: N/A;   Family History  Problem Relation Age of Onset  . Lung cancer Father   . Heart disease Mother     congestive heart failure  . Hypertension Mother   . Lung cancer Brother   . Diabetes Brother   . Colon polyps Sister    Social History   Social History  . Marital Status: Married    Spouse Name: N/A  . Number of Children: N/A  . Years of Education: N/A   Social History Main Topics  . Smoking status: Former Research scientist (life sciences)  . Smokeless tobacco: Never Used     Comment: quit smoking 1990  . Alcohol Use: 0.0 oz/week    0 Standard drinks or equivalent per week     Comment: glass of wine per day  . Drug Use: No  . Sexual Activity: Not Asked   Other Topics Concern  . None   Social History Narrative    Outpatient Encounter Prescriptions as of 11/04/2015  Medication Sig  . Cholecalciferol (VITAMIN D-3) 1000 UNITS CAPS Take 1,000 Units by mouth daily.   . fluticasone (FLONASE) 50 MCG/ACT nasal spray Place 1 spray into both nostrils as needed for allergies or rhinitis.  Marland Kitchen lisinopril-hydrochlorothiazide  (PRINZIDE,ZESTORETIC) 10-12.5 MG tablet Take 1 tablet by mouth daily.  . vitamin E 400 UNIT capsule Take 400 Units by mouth daily.   No facility-administered encounter medications on file as of 11/04/2015.    Review of Systems  Constitutional: Negative for appetite change and unexpected weight change.  HENT: Negative for congestion and sinus pressure.   Respiratory: Negative for cough, chest tightness and shortness of breath.   Cardiovascular: Negative for chest pain, palpitations and leg swelling.  Gastrointestinal: Negative for nausea, vomiting, abdominal pain and diarrhea.  Genitourinary: Negative for dysuria and difficulty urinating.  Musculoskeletal: Negative for back pain and joint swelling.  Skin: Negative for color change and rash.  Neurological: Negative for dizziness, light-headedness and headaches.  Psychiatric/Behavioral: Negative for dysphoric mood and agitation.       Objective:    Physical Exam  Constitutional: She appears well-developed and well-nourished. No distress.  HENT:  Nose: Nose normal.  Mouth/Throat: Oropharynx is clear and moist.  Neck: Neck supple. No thyromegaly present.  Cardiovascular: Normal rate and regular rhythm.   Pulmonary/Chest: Breath sounds normal. No respiratory distress. She has no wheezes.  Abdominal: Soft. Bowel sounds are normal. There is no tenderness.  Musculoskeletal: She exhibits no edema  or tenderness.  Lymphadenopathy:    She has no cervical adenopathy.  Skin: No rash noted. No erythema.  Psychiatric: She has a normal mood and affect. Her behavior is normal.    BP 120/70 mmHg  Pulse 74  Temp(Src) 97.6 F (36.4 C) (Oral)  Resp 18  Ht 5' 5.5" (1.664 m)  Wt 186 lb 6 oz (84.539 kg)  BMI 30.53 kg/m2  SpO2 94%  LMP 10/15/1999 Wt Readings from Last 3 Encounters:  11/04/15 186 lb 6 oz (84.539 kg)  07/15/15 189 lb (85.73 kg)  07/07/15 188 lb 4 oz (85.39 kg)     Lab Results  Component Value Date   WBC 5.6 01/22/2015    HGB 12.4 01/22/2015   HCT 37.9 01/22/2015   PLT 237.0 01/22/2015   GLUCOSE 88 11/04/2015   CHOL 178 11/04/2015   TRIG 43.0 11/04/2015   HDL 59.30 11/04/2015   LDLCALC 111* 11/04/2015   ALT 10 11/04/2015   AST 16 11/04/2015   NA 137 11/04/2015   K 4.3 11/04/2015   CL 101 11/04/2015   CREATININE 1.07 11/04/2015   BUN 14 11/04/2015   CO2 31 11/04/2015   TSH 0.88 01/22/2015       Assessment & Plan:   Problem List Items Addressed This Visit    Essential hypertension, benign    Blood pressure under good control.  Continue same medication regimen.  Follow pressures.  Follow metabolic panel.        Relevant Orders   Comprehensive metabolic panel (Completed)   History of colonic polyps    Colonoscopy 07/15/15 as outlined in overview.  Recommended f/u colonoscopy in 5 years.        Hypercholesterolemia - Primary    Low cholesterol diet and exercise.  Follow lipid panel.        Relevant Orders   Lipid panel (Completed)   Obesity (BMI 30-39.9)    Discussed diet and exercise.  Follow.       Stress    Doing better.  Stress better.  Follow.           Einar Pheasant, MD

## 2015-11-05 ENCOUNTER — Encounter: Payer: Self-pay | Admitting: *Deleted

## 2015-11-07 ENCOUNTER — Encounter: Payer: Self-pay | Admitting: Internal Medicine

## 2015-11-07 NOTE — Assessment & Plan Note (Signed)
Blood pressure under good control.  Continue same medication regimen.  Follow pressures.  Follow metabolic panel.   

## 2015-11-07 NOTE — Assessment & Plan Note (Signed)
Doing better.  Stress better.  Follow.

## 2015-11-07 NOTE — Assessment & Plan Note (Signed)
Discussed diet and exercise.  Follow.  

## 2015-11-07 NOTE — Assessment & Plan Note (Signed)
Colonoscopy 07/15/15 as outlined in overview.  Recommended f/u colonoscopy in 5 years.

## 2015-11-07 NOTE — Assessment & Plan Note (Signed)
Low cholesterol diet and exercise.  Follow lipid panel.   

## 2016-02-09 ENCOUNTER — Other Ambulatory Visit: Payer: Self-pay | Admitting: Internal Medicine

## 2016-02-27 ENCOUNTER — Other Ambulatory Visit: Payer: Self-pay | Admitting: Internal Medicine

## 2016-04-07 ENCOUNTER — Encounter: Payer: Self-pay | Admitting: Emergency Medicine

## 2016-04-07 ENCOUNTER — Emergency Department
Admission: EM | Admit: 2016-04-07 | Discharge: 2016-04-08 | Disposition: A | Discharge status: Home / Self Care | Payer: Medicare Other | Attending: Student in an Organized Health Care Education/Training Program | Admitting: Student in an Organized Health Care Education/Training Program

## 2016-04-07 ENCOUNTER — Emergency Department: Payer: Medicare Other

## 2016-04-07 DIAGNOSIS — I10 Essential (primary) hypertension: Secondary | ICD-10-CM | POA: Insufficient documentation

## 2016-04-07 DIAGNOSIS — R112 Nausea with vomiting, unspecified: Secondary | ICD-10-CM | POA: Insufficient documentation

## 2016-04-07 DIAGNOSIS — Z79899 Other long term (current) drug therapy: Secondary | ICD-10-CM | POA: Insufficient documentation

## 2016-04-07 DIAGNOSIS — R42 Dizziness and giddiness: Secondary | ICD-10-CM | POA: Diagnosis not present

## 2016-04-07 DIAGNOSIS — Z87891 Personal history of nicotine dependence: Secondary | ICD-10-CM | POA: Insufficient documentation

## 2016-04-07 LAB — CBC
HEMATOCRIT: 41.8 % (ref 35.0–47.0)
HEMOGLOBIN: 13.9 g/dL (ref 12.0–16.0)
MCH: 28 pg (ref 26.0–34.0)
MCHC: 33.1 g/dL (ref 32.0–36.0)
MCV: 84.6 fL (ref 80.0–100.0)
Platelets: 234 10*3/uL (ref 150–440)
RBC: 4.94 MIL/uL (ref 3.80–5.20)
RDW: 14.4 % (ref 11.5–14.5)
WBC: 8 10*3/uL (ref 3.6–11.0)

## 2016-04-07 LAB — COMPREHENSIVE METABOLIC PANEL
ALT: 9 U/L — ABNORMAL LOW (ref 14–54)
AST: 19 U/L (ref 15–41)
Albumin: 4.3 g/dL (ref 3.5–5.0)
Alkaline Phosphatase: 51 U/L (ref 38–126)
Anion gap: 12 (ref 5–15)
BUN: 14 mg/dL (ref 6–20)
CHLORIDE: 102 mmol/L (ref 101–111)
CO2: 24 mmol/L (ref 22–32)
Calcium: 9.5 mg/dL (ref 8.9–10.3)
Creatinine, Ser: 1.08 mg/dL — ABNORMAL HIGH (ref 0.44–1.00)
GFR, EST NON AFRICAN AMERICAN: 52 mL/min — AB (ref >60)
Glucose, Bld: 114 mg/dL — ABNORMAL HIGH (ref 65–99)
POTASSIUM: 3.7 mmol/L (ref 3.5–5.1)
SODIUM: 138 mmol/L (ref 135–145)
Total Bilirubin: 0.7 mg/dL (ref 0.3–1.2)
Total Protein: 7.9 g/dL (ref 6.5–8.1)

## 2016-04-07 LAB — TROPONIN I: Troponin I: <0.03 ng/mL (ref <0.03)

## 2016-04-07 MED ORDER — SODIUM CHLORIDE 0.9 % IV BOLUS (SEPSIS)
1000.0000 mL | Freq: Once | INTRAVENOUS | Status: AC
  Administered 2016-04-07: 1000 mL via INTRAVENOUS

## 2016-04-07 MED ORDER — ONDANSETRON 4 MG PO TBDP
4.0000 mg | ORAL_TABLET | Freq: Once | ORAL | Status: AC | PRN
  Administered 2016-04-07: 4 mg via ORAL

## 2016-04-07 MED ORDER — PROMETHAZINE HCL 25 MG/ML IJ SOLN
12.5000 mg | Freq: Once | INTRAMUSCULAR | Status: AC
  Administered 2016-04-07: 12.5 mg via INTRAVENOUS
  Filled 2016-04-07: qty 1

## 2016-04-07 MED ORDER — ONDANSETRON 4 MG PO TBDP
ORAL_TABLET | ORAL | Status: AC
  Filled 2016-04-07: qty 1

## 2016-04-07 MED ORDER — MECLIZINE HCL 25 MG PO TABS
12.5000 mg | ORAL_TABLET | Freq: Once | ORAL | Status: AC
  Administered 2016-04-07: 12.5 mg via ORAL
  Filled 2016-04-07: qty 1

## 2016-04-07 MED ORDER — MECLIZINE HCL 32 MG PO TABS: 32.0000 mg | ORAL_TABLET | Freq: Two times a day (BID) | ORAL | 0 refills | Status: DC | PRN

## 2016-04-07 NOTE — ED Provider Notes (Signed)
[facility] Emergency Department Provider Note    First MD Initiated Contact with Patient 04/07/16 2119     (approximate)  I have reviewed the triage vital signs and the nursing notes.   HISTORY  Chief Complaint Dizziness and Emesis    HPI Janice Floyd is a 65 y.o. female with chief complaint of sudden onset severe dizziness when the patient arose from a sitting position. Patient stated that she felt the room was spinning around. This resulted in nausea and several episodes of vomiting. States that she's never had symptoms like this before. Denies any numbness or tingling. Denies any headaches. Checked her blood pressure at that time it was normal. No history of stroke. No recent hearing loss or fevers. She's been eating normally. Denies any chest pain or palpitations.   Past Medical History:  Diagnosis Date  . Anemia    iron deficient  . Hypertension   . Vitamin D deficiency     Patient Active Problem List   Diagnosis Date Noted  . History of colonic polyps 07/19/2015  . Influenza-like illness 07/07/2015  . Health care maintenance 12/30/2014  . Hypercholesterolemia 12/29/2014  . Obesity (BMI 30-39.9) 07/04/2014  . Stress 01/18/2014  . Environmental allergies 07/19/2013  . Osteopenia 01/12/2013  . Essential hypertension, benign 10/14/2012  . Vitamin D deficiency 10/14/2012  . Anemia 10/14/2012  . Hematuria 10/14/2012    Past Surgical History:  Procedure Laterality Date  . APPENDECTOMY    . COLONOSCOPY WITH PROPOFOL N/A 07/15/2015   Procedure: COLONOSCOPY WITH PROPOFOL;  Surgeon: Hulen Luster, MD;  Location: [facility] ENDOSCOPY;  Service: Gastroenterology;  Laterality: N/A;  . MYOMECTOMY     x2    Prior to Admission medications   Medication Sig Start Date End Date Taking? Authorizing Provider  Cholecalciferol (VITAMIN D-3) 1000 UNITS CAPS Take 1,000 Units by mouth daily.     Historical Provider, MD  fluticasone (FLONASE) 50 MCG/ACT nasal spray  Place 1 spray into both nostrils as needed for allergies or rhinitis.    Historical Provider, MD  lisinopril-hydrochlorothiazide (PRINZIDE,ZESTORETIC) 10-12.5 MG tablet Take 1 tablet by mouth daily. 08/24/15   Einar Pheasant, MD  lisinopril-hydrochlorothiazide (PRINZIDE,ZESTORETIC) 10-12.5 MG tablet TAKE 1 TABLET BY MOUTH DAILY. 02/28/16   Einar Pheasant, MD  vitamin E 400 UNIT capsule Take 400 Units by mouth daily.    Historical Provider, MD    Allergies Review of patient's allergies indicates no known allergies.  Family History  Problem Relation Age of Onset  . Lung cancer Father   . Heart disease Mother     congestive heart failure  . Hypertension Mother   . Lung cancer Brother   . Diabetes Brother   . Colon polyps Sister     Social History Social History  Substance Use Topics  . Smoking status: Former Research scientist (life sciences)  . Smokeless tobacco: Never Used     Comment: quit smoking 1990  . Alcohol use 0.0 oz/week     Comment: glass of wine per day    Review of Systems Patient denies headaches, rhinorrhea, blurry vision, numbness, shortness of breath, chest pain, edema, cough, abdominal pain, nausea, vomiting, diarrhea, dysuria, fevers, rashes or hallucinations unless otherwise stated above in HPI. ____________________________________________   PHYSICAL EXAM:  VITAL SIGNS: Vitals:   04/07/16 2103  BP: 134/87  Pulse: 91  Resp: 20  Temp: 98.2 F (36.8 C)   Constitutional: Alert and oriented. Well appearing and in no acute distress. Eyes: Conjunctivae are normal. PERRL. EOMI.  Head: Atraumatic. Nose: No congestion/rhinnorhea. Mouth/Throat: Mucous membranes are moist.  Oropharynx non-erythematous. Neck: No stridor. Painless ROM. No cervical spine tenderness to palpation Hematological/Lymphatic/Immunilogical: No cervical lymphadenopathy. Cardiovascular: Normal rate, regular rhythm. Grossly normal heart sounds.  Good peripheral circulation. Respiratory: Normal respiratory effort.  No  retractions. Lungs CTAB. Gastrointestinal: Soft and nontender. No distention. No abdominal bruits. No CVA tenderness. Genitourinary:  Musculoskeletal: No lower extremity tenderness nor edema.  No joint effusions. Neurologic:  Normal speech and language. No gross focal neurologic deficits are appreciated. No gait instability.  Normal FNF, Normal heel to shin, - romberg Skin:  Skin is warm, dry and intact. No rash noted. Psychiatric: Mood and affect are normal. Speech and behavior are normal.  ____________________________________________   LABS (all labs ordered are listed, but only abnormal results are displayed)  Results for orders placed or performed during the hospital encounter of 04/07/16 (from the past 24 hour(s))  Comprehensive metabolic panel     Status: Abnormal   Collection Time: 04/07/16  9:07 PM  Result Value Ref Range   Sodium 138 135 - 145 mmol/L   Potassium 3.7 3.5 - 5.1 mmol/L   Chloride 102 101 - 111 mmol/L   CO2 24 22 - 32 mmol/L   Glucose, Bld 114 (H) 65 - 99 mg/dL   BUN 14 6 - 20 mg/dL   Creatinine, Ser 1.08 (H) 0.44 - 1.00 mg/dL   Calcium 9.5 8.9 - 10.3 mg/dL   Total Protein 7.9 6.5 - 8.1 g/dL   Albumin 4.3 3.5 - 5.0 g/dL   AST 19 15 - 41 U/L   ALT 9 (L) 14 - 54 U/L   Alkaline Phosphatase 51 38 - 126 U/L   Total Bilirubin 0.7 0.3 - 1.2 mg/dL   GFR calc non Af Amer 52 (L) >60 mL/min   GFR calc Af Amer >60 >60 mL/min   Anion gap 12 5 - 15  CBC     Status: None   Collection Time: 04/07/16  9:07 PM  Result Value Ref Range   WBC 8.0 3.6 - 11.0 K/uL   RBC 4.94 3.80 - 5.20 MIL/uL   Hemoglobin 13.9 12.0 - 16.0 g/dL   HCT 41.8 35.0 - 47.0 %   MCV 84.6 80.0 - 100.0 fL   MCH 28.0 26.0 - 34.0 pg   MCHC 33.1 32.0 - 36.0 g/dL   RDW 14.4 11.5 - 14.5 %   Platelets 234 150 - 440 K/uL  Troponin I     Status: None   Collection Time: 04/07/16  9:07 PM  Result Value Ref Range   Troponin I <0.03 <0.03 ng/mL   ____________________________________________  EKG My  review and personal interpretation at Time: 21:03   Indication: dizziness  Rate: 85  Rhythm: nsr  Axis: normal Other: no acute ischemic changes ____________________________________________  RADIOLOGY  CT head with NAICA ____________________________________________   PROCEDURES  Procedure(s) performed: none    Critical Care performed: no ____________________________________________   INITIAL IMPRESSION / ASSESSMENT AND PLAN / ED COURSE  Pertinent labs & imaging results that were available during my care of the patient were reviewed by me and considered in my medical decision making (see chart for details).  DDX: CVA, dehydration, BPPV, Mnire's, labyrinthitis, which N normality  Lincoln C Lozano is a 65 y.o. who presents to the ED with acute vertigo. Neuro exam is intact without focal deficits. CT head ordered due to concern for CVA shows no acute intracranial abnormalities . HINTS exam is consistent with peripheral  vertigo.  She has no coordination deficits to suggest a posterior stroke.  EKG is without any signs of dysrhythmia. Electrolytes within normal limits. Her abdominal exam is soft and benign. His of Antivert and reassess in addition to giving IV fluids given nausea vomiting  Clinical Course  Comment By Time  Repeat neuro exam is nonfocal. Presentation is more consistent with peripheral vertigo. Merlyn Lot, MD 09/01 2226  Patient reassessed was able to ambulate with a steady gait she denies any dizziness at this time. Abdominal exam is soft and benign. Vital signs remain normal. Based on her presentation do feel this is consistent with benign positional peripheral vertigo. We will discharge home with meclizine and follow-up with PCP. Have discussed with the patient and available family all diagnostics and treatments performed thus far and all questions were answered to the best of my ability. The patient demonstrates understanding and agreement with plan.  Merlyn Lot, MD 09/01 2314     ____________________________________________   FINAL CLINICAL IMPRESSION(S) / ED DIAGNOSES  Final diagnoses:  Dizziness  Non-intractable vomiting with nausea, vomiting of unspecified type      NEW MEDICATIONS STARTED DURING THIS VISIT:  New Prescriptions   No medications on file     Note:  This document was prepared using Dragon voice recognition software and may include unintentional dictation errors.    Merlyn Lot, MD 04/08/16 [phone]

## 2016-04-07 NOTE — ED Notes (Signed)
Helped pt to bathroom. 

## 2016-04-07 NOTE — ED Triage Notes (Signed)
Pt states dizziness that had been off and on for "a couple of days". Pt states she became very dizzy at 1400 today and had one episode of emesis on way to ed. perrl 15mm and brisk pt denies sensory change in extremities. Face symmetrical, strong equal bilateral upper extremity grips and foot presses, clear speech.

## 2016-04-07 NOTE — ED Notes (Signed)
Pt ambulated with MD Quentin Cornwall. Pt reports dizziness/nausea drastically reduced after meclizine administration. Pt reports she feels ready/safe to go home

## 2016-04-07 NOTE — ED Notes (Signed)
Ambulated pt per MD Quentin Cornwall request. Pt ambulated with unsteady gate. Pt reported dizziness. Pt denied increase in nausea. Wheeled pt back to room. MD Quentin Cornwall notified.

## 2016-04-08 DIAGNOSIS — R42 Dizziness and giddiness: Secondary | ICD-10-CM | POA: Diagnosis not present

## 2016-04-08 MED ORDER — MECLIZINE HCL 25 MG PO TABS
ORAL_TABLET | ORAL | Status: AC
  Administered 2016-04-08: 12.5 mg via ORAL
  Filled 2016-04-08: qty 1

## 2016-04-08 MED ORDER — MECLIZINE HCL 25 MG PO TABS
12.5000 mg | ORAL_TABLET | Freq: Once | ORAL | Status: AC
  Administered 2016-04-08: 12.5 mg via ORAL

## 2016-04-08 NOTE — ED Notes (Signed)
Ambulated pt. Pt reports decrease in dizziness. Informed MD Dahlia Client. MD approved discharged. Reviewed pt's d/c instructions, follow-up care and prescription with pt. Pt verbalized understanding

## 2016-04-17 ENCOUNTER — Ambulatory Visit (INDEPENDENT_AMBULATORY_CARE_PROVIDER_SITE_OTHER): Payer: Medicare Other | Admitting: Internal Medicine

## 2016-04-17 ENCOUNTER — Encounter: Payer: Self-pay | Admitting: Internal Medicine

## 2016-04-17 DIAGNOSIS — I10 Essential (primary) hypertension: Secondary | ICD-10-CM

## 2016-04-17 DIAGNOSIS — R42 Dizziness and giddiness: Secondary | ICD-10-CM

## 2016-04-17 MED ORDER — MECLIZINE HCL 32 MG PO TABS: 32.0000 mg | ORAL_TABLET | Freq: Two times a day (BID) | ORAL | 0 refills | Status: DC | PRN

## 2016-04-17 NOTE — Progress Notes (Signed)
Pre visit review using our clinic review tool, if applicable. No additional management support is needed unless otherwise documented below in the visit note. 

## 2016-04-17 NOTE — Progress Notes (Signed)
Patient ID: Janice Floyd, female   DOB: [birth year], 65 y.o.   MRN: [id number]   Subjective:    Patient ID: Janice Floyd, female    DOB: [birth year], 65 y.o.   MRN: [id number]  HPI  Patient here as a work in for ER follow up.  States that approximately two weeks prior to her ER visit, she noticed some mild dizziness while working out in her yard.  States would noticed when bending over for a while and then stand.  This quickly resolved.  On 04/07/16, states she went to her hair dresser.  Had her hair washed.  Went to get under the dryer and dizziness worsened.  Described room spinning.  Improved.  Drove home.  When home, worsened.  Room spinning associated with nausea and one episode of emesis.  To ER (04/07/16).  No significant headache.  No other focal abnormalities.  ER evaluating - negative CT.  Labs unrevealing.  Was given antivert.  States is some better.  Still with some dizziness. Worse when she first gets up out of bed.  Eating.  Staying hydrated.  Stays active.  No chest pain.  States when she rolls over on her right side - worse.     Past Medical History:  Diagnosis Date  . Anemia    iron deficient  . Hypertension   . Vitamin D deficiency    Past Surgical History:  Procedure Laterality Date  . APPENDECTOMY    . COLONOSCOPY WITH PROPOFOL N/A 07/15/2015   Procedure: COLONOSCOPY WITH PROPOFOL;  Surgeon: Hulen Luster, MD;  Location: [facility] ENDOSCOPY;  Service: Gastroenterology;  Laterality: N/A;  . MYOMECTOMY     x2   Family History  Problem Relation Age of Onset  . Lung cancer Father   . Heart disease Mother     congestive heart failure  . Hypertension Mother   . Lung cancer Brother   . Diabetes Brother   . Colon polyps Sister    Social History   Social History  . Marital status: Married    Spouse name: N/A  . Number of children: N/A  . Years of education: N/A   Social History Main Topics  . Smoking status: Former Research scientist (life sciences)  . Smokeless tobacco: Never Used     Comment: quit  smoking 1990  . Alcohol use 0.0 oz/week     Comment: glass of wine per day  . Drug use: No  . Sexual activity: Not Asked   Other Topics Concern  . None   Social History Narrative  . None    Outpatient Encounter Prescriptions as of 04/17/2016  Medication Sig  . Cholecalciferol (VITAMIN D-3) 1000 UNITS CAPS Take 1,000 Units by mouth daily.   . fluticasone (FLONASE) 50 MCG/ACT nasal spray Place 1 spray into both nostrils as needed for allergies or rhinitis.  Marland Kitchen lisinopril-hydrochlorothiazide (PRINZIDE,ZESTORETIC) 10-12.5 MG tablet TAKE 1 TABLET BY MOUTH DAILY.  . meclizine (ANTIVERT) 32 MG tablet Take 1 tablet (32 mg total) by mouth 2 (two) times daily as needed.  . vitamin E 400 UNIT capsule Take 400 Units by mouth daily.  . [DISCONTINUED] meclizine (ANTIVERT) 32 MG tablet Take 1 tablet (32 mg total) by mouth 2 (two) times daily as needed.  . [DISCONTINUED] lisinopril-hydrochlorothiazide (PRINZIDE,ZESTORETIC) 10-12.5 MG tablet Take 1 tablet by mouth daily.   No facility-administered encounter medications on file as of 04/17/2016.     Review of Systems  Constitutional: Negative for appetite change and fever.  HENT: Negative for congestion  and sinus pressure.   Respiratory: Negative for chest tightness and shortness of breath.   Cardiovascular: Negative for chest pain and palpitations.  Gastrointestinal:       Had some nausea and vomiting prior to ER visit - associated with dizziness.    Skin: Negative for color change and rash.  Neurological: Positive for dizziness. Negative for syncope and weakness.       No significant headache.   Psychiatric/Behavioral: Negative for agitation and dysphoric mood.       Objective:    Physical Exam  Constitutional: She appears well-developed and well-nourished. No distress.  HENT:  Nose: Nose normal.  Mouth/Throat: Oropharynx is clear and moist.  Wax present - both ears.    Neck: Neck supple.  Cardiovascular: Normal rate and regular rhythm.    Pulmonary/Chest: Breath sounds normal. No respiratory distress. She has no wheezes.  Abdominal: Soft. Bowel sounds are normal. There is no tenderness.  Musculoskeletal: She exhibits no edema or tenderness.  Lymphadenopathy:    She has no cervical adenopathy.  Skin: No rash noted. No erythema.  Psychiatric: She has a normal mood and affect. Her behavior is normal.    BP 104/82   Pulse 67   Temp 98 F (36.7 C) (Oral)   Ht 5\' 5"  (1.651 m)   Wt 184 lb 6.4 oz (83.6 kg)   LMP 10/15/1999   SpO2 98%   BMI 30.69 kg/m  Wt Readings from Last 3 Encounters:  04/17/16 184 lb 6.4 oz (83.6 kg)  04/07/16 190 lb (86.2 kg)  11/04/15 186 lb 6 oz (84.5 kg)     Lab Results  Component Value Date   WBC 8.0 04/07/2016   HGB 13.9 04/07/2016   HCT 41.8 04/07/2016   PLT 234 04/07/2016   GLUCOSE 114 (H) 04/07/2016   CHOL 178 11/04/2015   TRIG 43.0 11/04/2015   HDL 59.30 11/04/2015   LDLCALC 111 (H) 11/04/2015   ALT 9 (L) 04/07/2016   AST 19 04/07/2016   NA 138 04/07/2016   K 3.7 04/07/2016   CL 102 04/07/2016   CREATININE 1.08 (H) 04/07/2016   BUN 14 04/07/2016   CO2 24 04/07/2016   TSH 0.88 01/22/2015    Ct Head Wo Contrast  Result Date: 04/07/2016 CLINICAL DATA:  Dizziness, vomiting EXAM: CT HEAD WITHOUT CONTRAST TECHNIQUE: Contiguous axial images were obtained from the base of the skull through the vertex without intravenous contrast. COMPARISON:  None. FINDINGS: Brain: No evidence of acute infarction, hemorrhage, hydrocephalus, extra-axial collection or mass lesion/mass effect. Vascular: No hyperdense vessel or unexpected calcification. Skull: No evidence of calvarial fracture. Sinuses/Orbits: The visualized paranasal sinuses are essentially clear. The mastoid air cells are unopacified. Other: Cerebral volume is within normal limits. No ventriculomegaly. IMPRESSION: Normal head CT. Electronically Signed   By: Julian Hy M.D.   On: 04/07/2016 21:43       Assessment & Plan:    Problem List Items Addressed This Visit    Dizziness    Persistent room spinning/dizziness as outlined.  Reproducible if rolls over on right side.  No focal neuro deficits.  Taking antivert.  Is some better.  Still symptomatic.  Discussed w/up.  Had CT scan - negative.  ER w/up as outlined.  Appears to be c/w BPV.  Will have ENT evaluate and do further testing.  Pt in agreement.  Some cerumen present. Refilled antivert.        Relevant Orders   Ambulatory referral to ENT   Essential hypertension, benign  Blood pressure under good control.  Continue same medication regimen.  Follow pressures.  Follow metabolic panel.         Other Visit Diagnoses   None.    I spent 25 minutes with the patient and more than 50% of the time was spent in consultation regarding the above.     Einar Pheasant, MD

## 2016-04-18 ENCOUNTER — Encounter: Payer: Self-pay | Admitting: Internal Medicine

## 2016-04-18 DIAGNOSIS — R42 Dizziness and giddiness: Secondary | ICD-10-CM | POA: Insufficient documentation

## 2016-04-18 NOTE — Assessment & Plan Note (Addendum)
Persistent room spinning/dizziness as outlined.  Reproducible if rolls over on right side.  No focal neuro deficits.  Taking antivert.  Is some better.  Still symptomatic.  Discussed w/up.  Had CT scan - negative.  ER w/up as outlined.  Appears to be c/w BPV.  Will have ENT evaluate and do further testing.  Pt in agreement.  Some cerumen present. Refilled antivert.

## 2016-04-18 NOTE — Assessment & Plan Note (Signed)
Blood pressure under good control.  Continue same medication regimen.  Follow pressures.  Follow metabolic panel.   

## 2016-04-19 DIAGNOSIS — R42 Dizziness and giddiness: Secondary | ICD-10-CM | POA: Diagnosis not present

## 2016-04-19 DIAGNOSIS — H9042 Sensorineural hearing loss, unilateral, left ear, with unrestricted hearing on the contralateral side: Secondary | ICD-10-CM | POA: Diagnosis not present

## 2016-04-19 DIAGNOSIS — H905 Unspecified sensorineural hearing loss: Secondary | ICD-10-CM | POA: Diagnosis not present

## 2016-04-25 ENCOUNTER — Telehealth: Payer: Self-pay | Admitting: *Deleted

## 2016-04-25 NOTE — Telephone Encounter (Signed)
Please ask Dr.Scott if pt would be able to stop medication.

## 2016-04-25 NOTE — Telephone Encounter (Signed)
Patient stated that she has had averages of blood pressure readings ranging 99/60 and 89/60 She questioned if she should continue her blood pressure medication. Pt contact [phone]

## 2016-04-26 NOTE — Telephone Encounter (Signed)
Please advise 

## 2016-04-27 NOTE — Telephone Encounter (Signed)
Called and spoke to Ms Sprouse.  She will hold the medication for now.  Will monitor blood pressure.  Call with readings.  Explained would need to monitor and see how blood pressure is doing.  May need to restart just lisinopril.  Will also keep 05/10/16 appt.

## 2016-05-03 DIAGNOSIS — R42 Dizziness and giddiness: Secondary | ICD-10-CM | POA: Diagnosis not present

## 2016-05-03 DIAGNOSIS — H6123 Impacted cerumen, bilateral: Secondary | ICD-10-CM | POA: Diagnosis not present

## 2016-05-10 ENCOUNTER — Ambulatory Visit (INDEPENDENT_AMBULATORY_CARE_PROVIDER_SITE_OTHER): Payer: Medicare Other | Admitting: Internal Medicine

## 2016-05-10 ENCOUNTER — Encounter: Payer: Self-pay | Admitting: Internal Medicine

## 2016-05-10 VITALS — BP 118/80 | HR 72 | Temp 98.2°F | Ht 65.0 in | Wt 187.6 lb

## 2016-05-10 DIAGNOSIS — Z0000 Encounter for general adult medical examination without abnormal findings: Secondary | ICD-10-CM | POA: Diagnosis not present

## 2016-05-10 DIAGNOSIS — Z860100 Personal history of colon polyps, unspecified: Secondary | ICD-10-CM

## 2016-05-10 DIAGNOSIS — Z1239 Encounter for other screening for malignant neoplasm of breast: Secondary | ICD-10-CM

## 2016-05-10 DIAGNOSIS — I10 Essential (primary) hypertension: Secondary | ICD-10-CM

## 2016-05-10 DIAGNOSIS — E559 Vitamin D deficiency, unspecified: Secondary | ICD-10-CM

## 2016-05-10 DIAGNOSIS — Z1231 Encounter for screening mammogram for malignant neoplasm of breast: Secondary | ICD-10-CM | POA: Diagnosis not present

## 2016-05-10 DIAGNOSIS — R42 Dizziness and giddiness: Secondary | ICD-10-CM

## 2016-05-10 DIAGNOSIS — Z23 Encounter for immunization: Secondary | ICD-10-CM | POA: Diagnosis not present

## 2016-05-10 DIAGNOSIS — E7800 Pure hypercholesterolemia, unspecified: Secondary | ICD-10-CM

## 2016-05-10 DIAGNOSIS — F439 Reaction to severe stress, unspecified: Secondary | ICD-10-CM

## 2016-05-10 MED ORDER — FLUTICASONE PROPIONATE 50 MCG/ACT NA SUSP: 1.0000 | NASAL | 6 refills | Status: AC | PRN

## 2016-05-10 MED ORDER — LISINOPRIL 10 MG PO TABS: 10.0000 mg | ORAL_TABLET | Freq: Every day | ORAL | 2 refills | Status: DC

## 2016-05-10 NOTE — Progress Notes (Signed)
Pre visit review using our clinic review tool, if applicable. No additional management support is needed unless otherwise documented below in the visit note. 

## 2016-05-10 NOTE — Assessment & Plan Note (Addendum)
Physical today 05/10/16.  PAP 01/10/13 - negative with negative HPV.  Colonoscopy 07/15/15 - three polyps and diverticulosis.  Recommend repeat in five years.  Mammogram 06/23/15 - Birads I.  Scheduled for f/u mammogram 06/28/16.

## 2016-05-10 NOTE — Progress Notes (Signed)
Patient ID: Janice Floyd, female   DOB: 1950/12/29, 65 y.o.   MRN: [id number]   Subjective:    Patient ID: Janice Floyd, female    DOB: 25-Oct-1950, 65 y.o.   MRN: [id number]  HPI  Patient here for her physical exam.  Recently evaluated for dizziness.  See last note.  Had CT head in ER.  Had f/u here.  Referred to ENT.  She is scheduled for further vestibular testing this Friday.  She is better.  Still some heaviness in her head.  Still a little dizzy with sudden movements.  Discussed MRI.  Waiting for test results Friday.  Eating and drinking.  No nausea or vomiting.  No abdominal pain.  Bowels stable.     Past Medical History:  Diagnosis Date  . Anemia    iron deficient  . Hypertension   . Vitamin D deficiency    Past Surgical History:  Procedure Laterality Date  . APPENDECTOMY    . COLONOSCOPY WITH PROPOFOL N/A 07/15/2015   Procedure: COLONOSCOPY WITH PROPOFOL;  Surgeon: Hulen Luster, MD;  Location: [facility] ENDOSCOPY;  Service: Gastroenterology;  Laterality: N/A;  . MYOMECTOMY     x2   Family History  Problem Relation Age of Onset  . Lung cancer Father   . Heart disease Mother     congestive heart failure  . Hypertension Mother   . Lung cancer Brother   . Diabetes Brother   . Colon polyps Sister    Social History   Social History  . Marital status: Married    Spouse name: N/A  . Number of children: N/A  . Years of education: N/A   Social History Main Topics  . Smoking status: Former Research scientist (life sciences)  . Smokeless tobacco: Never Used     Comment: quit smoking 1990  . Alcohol use 0.0 oz/week     Comment: glass of wine per day  . Drug use: No  . Sexual activity: Not Asked   Other Topics Concern  . None   Social History Narrative  . None    Outpatient Encounter Prescriptions as of 05/10/2016  Medication Sig  . Cholecalciferol (VITAMIN D-3) 1000 UNITS CAPS Take 1,000 Units by mouth daily.   . fluticasone (FLONASE) 50 MCG/ACT nasal spray Place 1 spray into both nostrils as  needed for allergies or rhinitis.  Marland Kitchen meclizine (ANTIVERT) 32 MG tablet Take 1 tablet (32 mg total) by mouth 2 (two) times daily as needed.  . vitamin E 400 UNIT capsule Take 400 Units by mouth daily.  . [DISCONTINUED] fluticasone (FLONASE) 50 MCG/ACT nasal spray Place 1 spray into both nostrils as needed for allergies or rhinitis.  . [DISCONTINUED] lisinopril-hydrochlorothiazide (PRINZIDE,ZESTORETIC) 10-12.5 MG tablet TAKE 1 TABLET BY MOUTH DAILY.  Marland Kitchen lisinopril (PRINIVIL,ZESTRIL) 10 MG tablet Take 1 tablet (10 mg total) by mouth daily.   No facility-administered encounter medications on file as of 05/10/2016.     Review of Systems  Constitutional: Negative for appetite change and unexpected weight change.  HENT: Negative for congestion and sinus pressure.   Eyes: Negative for pain and visual disturbance.  Respiratory: Negative for cough, chest tightness and shortness of breath.   Cardiovascular: Negative for chest pain, palpitations and leg swelling.  Gastrointestinal: Negative for abdominal pain, diarrhea, nausea and vomiting.  Genitourinary: Negative for difficulty urinating and dysuria.  Musculoskeletal: Negative for back pain and joint swelling.  Skin: Negative for color change and rash.  Neurological: Positive for dizziness and light-headedness. Negative for headaches.  Hematological: Negative for adenopathy. Does not bruise/bleed easily.  Psychiatric/Behavioral: Negative for agitation and dysphoric mood.       Objective:     Blood pressure rechecked by me:  136/88  Physical Exam  Constitutional: She is oriented to person, place, and time. She appears well-developed and well-nourished. No distress.  HENT:  Nose: Nose normal.  Mouth/Throat: Oropharynx is clear and moist.  Eyes: Right eye exhibits no discharge. Left eye exhibits no discharge. No scleral icterus.  Neck: Neck supple. No thyromegaly present.  Cardiovascular: Normal rate and regular rhythm.   Pulmonary/Chest:  Breath sounds normal. No accessory muscle usage. No tachypnea. No respiratory distress. She has no decreased breath sounds. She has no wheezes. She has no rhonchi. Right breast exhibits no inverted nipple, no mass, no nipple discharge and no tenderness (no axillary adenopathy). Left breast exhibits no inverted nipple, no mass, no nipple discharge and no tenderness (no axilarry adenopathy).  Abdominal: Soft. Bowel sounds are normal. There is no tenderness.  Musculoskeletal: She exhibits no edema or tenderness.  Lymphadenopathy:    She has no cervical adenopathy.  Neurological: She is alert and oriented to person, place, and time.  Skin: Skin is warm. No rash noted. No erythema.  Psychiatric: She has a normal mood and affect. Her behavior is normal.    BP 118/80   Pulse 72   Temp 98.2 F (36.8 C) (Oral)   Ht 5\' 5"  (1.651 m)   Wt 187 lb 9.6 oz (85.1 kg)   LMP 10/15/1999   SpO2 99%   BMI 31.22 kg/m  Wt Readings from Last 3 Encounters:  05/10/16 187 lb 9.6 oz (85.1 kg)  04/17/16 184 lb 6.4 oz (83.6 kg)  04/07/16 190 lb (86.2 kg)     Lab Results  Component Value Date   WBC 8.0 04/07/2016   HGB 13.9 04/07/2016   HCT 41.8 04/07/2016   PLT 234 04/07/2016   GLUCOSE 114 (H) 04/07/2016   CHOL 178 11/04/2015   TRIG 43.0 11/04/2015   HDL 59.30 11/04/2015   LDLCALC 111 (H) 11/04/2015   ALT 9 (L) 04/07/2016   AST 19 04/07/2016   NA 138 04/07/2016   K 3.7 04/07/2016   CL 102 04/07/2016   CREATININE 1.08 (H) 04/07/2016   BUN 14 04/07/2016   CO2 24 04/07/2016   TSH 0.88 01/22/2015    Ct Head Wo Contrast  Result Date: 04/07/2016 CLINICAL DATA:  Dizziness, vomiting EXAM: CT HEAD WITHOUT CONTRAST TECHNIQUE: Contiguous axial images were obtained from the base of the skull through the vertex without intravenous contrast. COMPARISON:  None. FINDINGS: Brain: No evidence of acute infarction, hemorrhage, hydrocephalus, extra-axial collection or mass lesion/mass effect. Vascular: No hyperdense  vessel or unexpected calcification. Skull: No evidence of calvarial fracture. Sinuses/Orbits: The visualized paranasal sinuses are essentially clear. The mastoid air cells are unopacified. Other: Cerebral volume is within normal limits. No ventriculomegaly. IMPRESSION: Normal head CT. Electronically Signed   By: Julian Hy M.D.   On: 04/07/2016 21:43       Assessment & Plan:   Problem List Items Addressed This Visit    Dizziness    See last note.  Has been evaluated by ENT.  Planning for vestibular testing this Friday.  Follow.  Is better.        Essential hypertension, benign    Blood pressure has been under good control.  Medication recently adjusted secondary to dizziness.  States her checks are averaging 130-134/90.  Will have her take lisinopril  10mg  q day.  Follow pressures.  Adjust medication as needed.          Relevant Medications   lisinopril (PRINIVIL,ZESTRIL) 10 MG tablet   Health care maintenance    Physical today 05/10/16.  PAP 01/10/13 - negative with negative HPV.  Colonoscopy 07/15/15 - three polyps and diverticulosis.  Recommend repeat in five years.  Mammogram 06/23/15 - Birads I.  Scheduled for f/u mammogram 06/28/16.        History of colonic polyps    colonoscopy 07/15/15.  Recommended f/u in five years.        Hypercholesterolemia    Low cholesterol diet and exercise.  Follow lipid panel.   Lab Results  Component Value Date   CHOL 178 11/04/2015   HDL 59.30 11/04/2015   LDLCALC 111 (H) 11/04/2015   TRIG 43.0 11/04/2015   CHOLHDL 3 11/04/2015        Relevant Medications   lisinopril (PRINIVIL,ZESTRIL) 10 MG tablet   Stress    Doing better.  Follow.        Vitamin D deficiency    Has a history of vitamin D deficiency.  Follow level.         Other Visit Diagnoses    Encounter for breast cancer screening other than mammogram    -  Primary   Encounter for screening mammogram for high-risk patient       Relevant Orders   MM Digital Screening    Encounter for immunization       Relevant Orders   Flu vaccine HIGH DOSE PF (Completed)       Einar Pheasant, MD

## 2016-05-12 DIAGNOSIS — R42 Dizziness and giddiness: Secondary | ICD-10-CM | POA: Diagnosis not present

## 2016-05-18 ENCOUNTER — Telehealth: Payer: Self-pay | Admitting: Internal Medicine

## 2016-05-18 NOTE — Telephone Encounter (Signed)
Please advise 

## 2016-05-18 NOTE — Telephone Encounter (Signed)
Left message to return call 

## 2016-05-18 NOTE — Telephone Encounter (Signed)
[facility] Patient Name: Janice Floyd DOB: 30-Mar-1951 Initial Comment Caller states her blood pressure is 147/92 and when she gets up she has dizziness. Nurse Assessment Nurse: Martyn Ehrich RN, Felicia Date/Time (Eastern Time): 05/18/2016 4:46:59 PM Confirm and document reason for call. If symptomatic, describe symptoms. You must click the next button to save text entered. ---PT has 147/92 at 4 pm and she is dizzy (lightheded for 1 month) - now it is 130/90 so it is fluctuating back and forth - it increases when she stand and walks up stairs. She is being tested for vertigo - ENT tested her last Friday and she doesnt know results. Dr. Nicki Reaper changed her BP med bc BP was low and she started the medication change sometime last week. (Only had vertigo once a month ago. - just lightheaded. no fever Has the patient traveled out of the country within the last 30 days? ---No Does the patient have any new or worsening symptoms? ---Yes Will a triage be completed? ---Yes Related visit to physician within the last 2 weeks? ---Yes Does the PT have any chronic conditions? (i.e. diabetes, asthma, etc.) ---No Is this a behavioral health or substance abuse call? ---No Guidelines Guideline Title Affirmed Question Affirmed Notes High Blood Pressure [1] Taking BP medications AND [2] feels is having side effects (e.g., impotence, cough, dizzy upon standing) Dizziness - Lightheadedness Taking a medicine that could cause dizziness (e.g., blood pressure medications, diuretics) Final Disposition User See Physician within 24 Hours Gaddy, RN, FeliciaComments now BP is 138/94 - no BP was higher today than those in the record lightheadedness is moderate she is not trying to hold onto things when she walks SHE COULD NOT TAKE THE EARLY APPTS BC SHE IS OUT OF TOWN. ALSO OFFICE TRIED TO REACH HER LATE TODAY. SHE  WANTS CALL BACK FROM MD RELATED TO RECENT BP MED CHANGES (told her if she doesnt get call back go to [facility] - she is going to [place] in [place]. She did declined info about [facility].Comments HR is 71 - evenly spaced now she says dizziness is mild 117/84 Referrals GO TO FACILITY UNDECIDED Disagree/Comply: Comply Call Id: [id number]

## 2016-05-18 NOTE — Telephone Encounter (Signed)
Pt called about the medication of lisinopril (PRINIVIL,ZESTRIL) 10 MG tablet and pt states that her bp is going up after taking it three or four times a day. Pt states it goes if she is walking around. Pt does a slight headache and pressure. Pt wants to know if she needs to back on a higher dosage or another medication? Pt is out of town. Taken at 3:45p Sitting bp is 135/88 and second was taken at 3:52 135/102.  Please advise?  Call pt @ [phone]. Thank you!  Pt was transferred to [facility].

## 2016-05-18 NOTE — Telephone Encounter (Signed)
We can increase the lisinopril to 20mg  q day.  (she can take two of the 10mg  tablets until run out).  Ok to call in rx for 20mg  lisinopril q day.  Also have her not check her blood pressure so close together.  Sometimes this can falsely elevate the blood pressure.   Let us now if any persistent problems.  If any acute problems, will need to be evaluated.

## 2016-05-18 NOTE — Telephone Encounter (Deleted)
[facility] Patient Name: Janice Floyd DOB: June 29, 1951 Initial Comment Caller states her blood pressure is 147/92 and when she gets up she has dizziness. Nurse Assessment Nurse: Martyn Ehrich RN, Felicia Date/Time (Eastern Time): 05/18/2016 4:46:59 PM Confirm and document reason for call. If symptomatic, describe symptoms. You must click the next button to save text entered. ---PT has 147/92 at 4 pm and she is dizzy (lightheded for 1 month) - now it is 130/90 so it is fluctuating back and forth - it increases when she stand and walks up stairs. She is being tested for vertigo - ENT tested her last Friday and she doesnt know results. Dr. Nicki Reaper changed her BP med bc BP was low and she started the medication change sometime last week. (Only had vertigo once a month ago. - just lightheaded. no fever Has the patient traveled out of the country within the last 30 days? ---No Does the patient have any new or worsening symptoms? ---Yes

## 2016-05-19 NOTE — Telephone Encounter (Signed)
Spoke with the patient, she will try the lisinopril at the increased dose and check bp in the mid day, she will let us know what her BP is. thanks

## 2016-05-19 NOTE — Telephone Encounter (Signed)
Attempted to reach the patient.

## 2016-05-22 ENCOUNTER — Encounter: Payer: Self-pay | Admitting: Internal Medicine

## 2016-05-22 NOTE — Assessment & Plan Note (Signed)
colonoscopy 07/15/15.  Recommended f/u in five years.

## 2016-05-22 NOTE — Assessment & Plan Note (Signed)
Doing better.  Follow.   

## 2016-05-22 NOTE — Assessment & Plan Note (Signed)
Low cholesterol diet and exercise.  Follow lipid panel.   Lab Results  Component Value Date   CHOL 178 11/04/2015   HDL 59.30 11/04/2015   LDLCALC 111 (H) 11/04/2015   TRIG 43.0 11/04/2015   CHOLHDL 3 11/04/2015

## 2016-05-22 NOTE — Assessment & Plan Note (Addendum)
Blood pressure has been under good control.  Medication recently adjusted secondary to dizziness.  States her checks are averaging 130-134/90.  Will have her take lisinopril 10mg  q day.  Follow pressures.  Adjust medication as needed.

## 2016-05-22 NOTE — Assessment & Plan Note (Signed)
See last note.  Has been evaluated by ENT.  Planning for vestibular testing this Friday.  Follow.  Is better.

## 2016-05-22 NOTE — Assessment & Plan Note (Signed)
Has a history of vitamin D deficiency.  Follow level.

## 2016-05-26 DIAGNOSIS — H8191 Unspecified disorder of vestibular function, right ear: Secondary | ICD-10-CM | POA: Diagnosis not present

## 2016-06-06 ENCOUNTER — Telehealth: Payer: Self-pay | Admitting: Internal Medicine

## 2016-06-06 NOTE — Telephone Encounter (Signed)
I called pt and left a vm to call office to sch AWV. Thank you! °

## 2016-06-15 DIAGNOSIS — H2513 Age-related nuclear cataract, bilateral: Secondary | ICD-10-CM | POA: Diagnosis not present

## 2016-06-26 DIAGNOSIS — R2681 Unsteadiness on feet: Secondary | ICD-10-CM | POA: Diagnosis not present

## 2016-06-27 ENCOUNTER — Ambulatory Visit (INDEPENDENT_AMBULATORY_CARE_PROVIDER_SITE_OTHER): Payer: Medicare Other | Admitting: Internal Medicine

## 2016-06-27 ENCOUNTER — Encounter: Payer: Self-pay | Admitting: Internal Medicine

## 2016-06-27 DIAGNOSIS — E7800 Pure hypercholesterolemia, unspecified: Secondary | ICD-10-CM | POA: Diagnosis not present

## 2016-06-27 DIAGNOSIS — R42 Dizziness and giddiness: Secondary | ICD-10-CM

## 2016-06-27 DIAGNOSIS — I10 Essential (primary) hypertension: Secondary | ICD-10-CM

## 2016-06-27 NOTE — Progress Notes (Signed)
Pre visit review using our clinic review tool, if applicable. No additional management support is needed unless otherwise documented below in the visit note. 

## 2016-06-27 NOTE — Progress Notes (Signed)
Patient ID: JEWELS KRESGE, female   DOB: 1951/01/29, 65 y.o.   MRN: [id number]   Subjective:    Patient ID: SHALECIA HOOP, female    DOB: 03-22-1951, 65 y.o.   MRN: [id number]  HPI  Patient here for a scheduled follow up.  She recently had problems with persistent dizziness.  See previous notes.  Saw Dr Pryor Ochoa.  Found to have vestibular dysfunction.  She has been going to vestibular therapy.  Doing much better.  Feels better.  States only notices symptoms now with sudden movements.  We have adjusted her blood pressure medication.  States her blood pressure is averaging 125-130/83.  On lisinopril 10mg  q day.  No chest pain.  Breathing stable.  Tries to stay active.     Past Medical History:  Diagnosis Date  . Anemia    iron deficient  . Hypertension   . Vitamin D deficiency    Past Surgical History:  Procedure Laterality Date  . APPENDECTOMY    . COLONOSCOPY WITH PROPOFOL N/A 07/15/2015   Procedure: COLONOSCOPY WITH PROPOFOL;  Surgeon: Hulen Luster, MD;  Location: [facility] ENDOSCOPY;  Service: Gastroenterology;  Laterality: N/A;  . MYOMECTOMY     x2   Family History  Problem Relation Age of Onset  . Lung cancer Father   . Heart disease Mother     congestive heart failure  . Hypertension Mother   . Lung cancer Brother   . Diabetes Brother   . Colon polyps Sister    Social History   Social History  . Marital status: Married    Spouse name: N/A  . Number of children: N/A  . Years of education: N/A   Social History Main Topics  . Smoking status: Former Research scientist (life sciences)  . Smokeless tobacco: Never Used     Comment: quit smoking 1990  . Alcohol use 0.0 oz/week     Comment: glass of wine per day  . Drug use: No  . Sexual activity: Not Asked   Other Topics Concern  . None   Social History Narrative  . None    Outpatient Encounter Prescriptions as of 06/27/2016  Medication Sig  . Cholecalciferol (VITAMIN D-3) 1000 UNITS CAPS Take 1,000 Units by mouth daily.   . fluticasone (FLONASE)  50 MCG/ACT nasal spray Place 1 spray into both nostrils as needed for allergies or rhinitis.  Marland Kitchen lisinopril (PRINIVIL,ZESTRIL) 10 MG tablet Take 1 tablet (10 mg total) by mouth daily.  . vitamin E 400 UNIT capsule Take 400 Units by mouth daily.  . [DISCONTINUED] meclizine (ANTIVERT) 32 MG tablet Take 1 tablet (32 mg total) by mouth 2 (two) times daily as needed.   No facility-administered encounter medications on file as of 06/27/2016.     Review of Systems  Constitutional: Negative for appetite change and unexpected weight change.  HENT: Negative for congestion and sinus pressure.   Respiratory: Negative for cough, chest tightness and shortness of breath.   Cardiovascular: Negative for chest pain, palpitations and leg swelling.  Gastrointestinal: Negative for abdominal pain, diarrhea, nausea and vomiting.  Genitourinary: Negative for difficulty urinating and dysuria.  Musculoskeletal: Negative for back pain and joint swelling.  Skin: Negative for color change and rash.  Neurological: Negative for headaches.       Dizziness much improved.    Psychiatric/Behavioral: Negative for agitation and dysphoric mood.       Objective:     Blood pressure on recheck by me:  128/84  Physical Exam  Constitutional: She  appears well-developed and well-nourished. No distress.  HENT:  Nose: Nose normal.  Mouth/Throat: Oropharynx is clear and moist.  Neck: Neck supple. No thyromegaly present.  Cardiovascular: Normal rate and regular rhythm.   Pulmonary/Chest: Breath sounds normal. No respiratory distress. She has no wheezes.  Abdominal: Soft. Bowel sounds are normal. There is no tenderness.  Musculoskeletal: She exhibits no edema or tenderness.  Lymphadenopathy:    She has no cervical adenopathy.  Skin: No rash noted. No erythema.  Psychiatric: She has a normal mood and affect. Her behavior is normal.    BP 120/86   Pulse 97   Temp 97.5 F (36.4 C) (Oral)   Ht 5\' 5"  (1.651 m)   Wt 190 lb  3.2 oz (86.3 kg)   LMP 10/15/1999   SpO2 98%   BMI 31.65 kg/m  Wt Readings from Last 3 Encounters:  06/27/16 190 lb 3.2 oz (86.3 kg)  05/10/16 187 lb 9.6 oz (85.1 kg)  04/17/16 184 lb 6.4 oz (83.6 kg)     Lab Results  Component Value Date   WBC 8.0 04/07/2016   HGB 13.9 04/07/2016   HCT 41.8 04/07/2016   PLT 234 04/07/2016   GLUCOSE 114 (H) 04/07/2016   CHOL 178 11/04/2015   TRIG 43.0 11/04/2015   HDL 59.30 11/04/2015   LDLCALC 111 (H) 11/04/2015   ALT 9 (L) 04/07/2016   AST 19 04/07/2016   NA 138 04/07/2016   K 3.7 04/07/2016   CL 102 04/07/2016   CREATININE 1.08 (H) 04/07/2016   BUN 14 04/07/2016   CO2 24 04/07/2016   TSH 0.88 01/22/2015    Ct Head Wo Contrast  Result Date: 04/07/2016 CLINICAL DATA:  Dizziness, vomiting EXAM: CT HEAD WITHOUT CONTRAST TECHNIQUE: Contiguous axial images were obtained from the base of the skull through the vertex without intravenous contrast. COMPARISON:  None. FINDINGS: Brain: No evidence of acute infarction, hemorrhage, hydrocephalus, extra-axial collection or mass lesion/mass effect. Vascular: No hyperdense vessel or unexpected calcification. Skull: No evidence of calvarial fracture. Sinuses/Orbits: The visualized paranasal sinuses are essentially clear. The mastoid air cells are unopacified. Other: Cerebral volume is within normal limits. No ventriculomegaly. IMPRESSION: Normal head CT. Electronically Signed   By: Julian Hy M.D.   On: 04/07/2016 21:43       Assessment & Plan:   Problem List Items Addressed This Visit    Dizziness    See previous notes.  Saw ENT.  Had vestibular rehab.  Doing better.  Follow.        Essential hypertension, benign    Blood pressure has been under reasonable control.  Continue same medication regimen.  Follow pressures.  Follow metabolic panel.         Hypercholesterolemia    Low cholesterol diet and exercise.  Follow lipid panel.            Einar Pheasant, MD

## 2016-06-28 ENCOUNTER — Ambulatory Visit: Payer: Federal, State, Local not specified - PPO

## 2016-07-01 ENCOUNTER — Encounter: Payer: Self-pay | Admitting: Internal Medicine

## 2016-07-01 NOTE — Assessment & Plan Note (Signed)
Blood pressure has been under reasonable control.  Continue same medication regimen.  Follow pressures.  Follow metabolic panel.  

## 2016-07-01 NOTE — Assessment & Plan Note (Signed)
Low cholesterol diet and exercise.  Follow lipid panel.   

## 2016-07-01 NOTE — Assessment & Plan Note (Signed)
See previous notes.  Saw ENT.  Had vestibular rehab.  Doing better.  Follow.

## 2016-07-04 DIAGNOSIS — R2681 Unsteadiness on feet: Secondary | ICD-10-CM | POA: Diagnosis not present

## 2016-07-13 ENCOUNTER — Ambulatory Visit (INDEPENDENT_AMBULATORY_CARE_PROVIDER_SITE_OTHER): Payer: Medicare Other

## 2016-07-13 ENCOUNTER — Telehealth: Payer: Self-pay | Admitting: Internal Medicine

## 2016-07-13 VITALS — BP 110/70 | HR 70 | Temp 97.5°F | Resp 12 | Ht 65.0 in | Wt 190.0 lb

## 2016-07-13 DIAGNOSIS — Z0000 Encounter for general adult medical examination without abnormal findings: Secondary | ICD-10-CM

## 2016-07-13 DIAGNOSIS — Z23 Encounter for immunization: Secondary | ICD-10-CM

## 2016-07-13 NOTE — Progress Notes (Signed)
Subjective:   Janice Floyd is a 65 y.o. female who presents for an Initial Medicare Annual Wellness Visit.  Review of Systems    No ROS.  Medicare Wellness Visit.  Cardiac Risk Factors include: advanced age (>94mn, >>57women);hypertension;obesity (BMI >30kg/m2)     Objective:    Today's Vitals   07/13/16 0949  BP: 110/70  Pulse: 70  Resp: 12  Temp: 97.5 F (36.4 C)  TempSrc: Oral  SpO2: 98%  Weight: 190 lb (86.2 kg)  Height: 5' 5"  (1.651 m)   Body mass index is 31.62 kg/m.   Current Medications (verified) Outpatient Encounter Prescriptions as of 07/13/2016  Medication Sig  . Cholecalciferol (VITAMIN D-3) 1000 UNITS CAPS Take 1,000 Units by mouth daily.   . fluticasone (FLONASE) 50 MCG/ACT nasal spray Place 1 spray into both nostrils as needed for allergies or rhinitis.  .Marland Kitchenlisinopril (PRINIVIL,ZESTRIL) 10 MG tablet Take 1 tablet (10 mg total) by mouth daily.  . vitamin E 400 UNIT capsule Take 400 Units by mouth daily.   No facility-administered encounter medications on file as of 07/13/2016.     Allergies (verified) Patient has no known allergies.   History: Past Medical History:  Diagnosis Date  . Anemia    iron deficient  . Hypertension   . Vitamin D deficiency    Past Surgical History:  Procedure Laterality Date  . APPENDECTOMY    . COLONOSCOPY WITH PROPOFOL N/A 07/15/2015   Procedure: COLONOSCOPY WITH PROPOFOL;  Surgeon: PHulen Luster MD;  [facility];  Service: Gastroenterology;  Laterality: N/A;  . MYOMECTOMY     x2   Family History  Problem Relation Age of Onset  . Lung cancer Father   . Heart disease Mother     congestive heart failure  . Hypertension Mother   . Lung cancer Brother   . Diabetes Brother   . Colon polyps Sister    Social History   Occupational History  . Not on file.   Social History Main Topics  . Smoking status: Former SResearch scientist (life sciences) . Smokeless tobacco: Never Used     Comment: quit smoking 1990  . Alcohol use  0.0 oz/week     Comment: glass of wine per day  . Drug use: No  . Sexual activity: Not Currently    Tobacco Counseling Counseling given: Not Answered   Activities of Daily Living In your present state of health, do you have any difficulty performing the following activities: 07/13/2016  Hearing? N  Vision? N  Difficulty concentrating or making decisions? N  Walking or climbing stairs? N  Dressing or bathing? N  Doing errands, shopping? N  Preparing Food and eating ? N  Using the Toilet? N  In the past six months, have you accidently leaked urine? N  Do you have problems with loss of bowel control? N  Managing your Medications? N  Managing your Finances? N  Housekeeping or managing your Housekeeping? N  Some recent data might be hidden    Immunizations and Health Maintenance Immunization History  Administered Date(s) Administered  . Influenza, High Dose Seasonal PF 05/10/2016  . Influenza,inj,Quad PF,36+ Mos 06/29/2014, 05/03/2015  . Pneumococcal Conjugate-13 07/21/2014  . Pneumococcal Polysaccharide-23 07/13/2016  . Pneumococcal-Unspecified 08/07/2010  . Zoster 02/23/2014   Health Maintenance Due  Topic Date Due  . Hepatitis C Screening  007-30-1952 . MAMMOGRAM  06/22/2016    Patient Care Team: CEinar Pheasant MD as PCP - General (Internal Medicine)  Indicate any  recent Medical Services you may have received from other than [facility] providers in the past year (date may be approximate).     Assessment:   This is a routine wellness examination for Janice Floyd. The goal of the wellness visit is to assist the patient how to close the gaps in care and create a preventative care plan for the patient.   Taking calcium VIT D as appropriate/Osteoporosis risk reviewed.  Medications reviewed; taking without issues or barriers.  Safety issues reviewed; lives with husband. Alarm system with smoke and carbon monoxide detectors in the home. Firearms locked in a safe within the  home. Wears seatbelts when driving or riding with others. No violence in the home.  No identified risk were noted; The patient was oriented x 3; appropriate in dress and manner and no objective failures at ADL's or IADL's.   BMI; discussed the importance of a healthy diet, water intake and exercise. Educational material provided.  HTN; well controlled and managed with medication.  Stable and followed by PCP.  Pneumovax 23 vaccine administered R deltoid, tolerated well.  Educational material provided.  Hep C Screening; discussed.  Educational material provided.  Patient Concerns: None at this time. Follow up with PCP as needed.  Hearing/Vision screen Hearing Screening Comments:  ENT (Dr. Pryor Ochoa) Passes the whisper test Vision Screening Comments: Followed by [facility] Last OV 06/2016  Dietary issues and exercise activities discussed: Current Exercise Habits: Structured exercise class (Yoga , Tai Chi, Yard work), Type of exercise: strength training/weights;calisthenics, Time (Minutes): 45, Intensity: Moderate  Goals    . Increase water intake          STAY HYDRATED AND DRINK PLENTY OF FLUIDS    . Low Carb Foods          Lean meats, vegetables.  Educational material provided.      Depression Screen PHQ 2/9 Scores 06/27/2016 05/10/2016 04/17/2016 12/29/2014 01/13/2014  PHQ - 2 Score 0 0 0 1 2  PHQ- 9 Score - - - - 3    Fall Risk Fall Risk  06/27/2016 05/10/2016 04/17/2016 12/29/2014 01/13/2014  Falls in the past year? No No No No No    Cognitive Function:     6CIT Screen 07/13/2016  What Year? 0 points  What month? 0 points  What time? 0 points  Count back from 20 0 points  Months in reverse 0 points  Repeat phrase 0 points  Total Score 0    Screening Tests Health Maintenance  Topic Date Due  . Hepatitis C Screening  [birth year]  . MAMMOGRAM  06/22/2016  . TETANUS/TDAP  08/08/2019  . COLONOSCOPY  07/14/2020  . INFLUENZA VACCINE  Completed  . DEXA  SCAN  Completed  . ZOSTAVAX  Completed  . PNA vac Low Risk Adult  Completed      Plan:    End of life planning; Advance aging; Advanced directives discussed. No HCPOA/Living Will.  Additional information provided to help her start the conversation with her family.  Copy of completed HCPOA/Living Will short forms requested upon completion.  Time spent on this topic is 20 minutes.    Medicare Attestation I have personally reviewed: The patient's medical and social history Their use of alcohol, tobacco or illicit drugs Their current medications and supplements The patient's functional ability including ADLs,fall risks, home safety risks, cognitive, and hearing and visual impairment Diet and physical activities Evidence for depression   The patient's weight, height, BMI, and visual acuity have been recorded in the  chart.  I have made referrals and provided education to the patient based on review of the above and I have provided the patient with a written personalized care plan for preventive services.    During the course of the visit, Darlene was educated and counseled about the following appropriate screening and preventive services:   Vaccines to include Pneumoccal, Influenza, Hepatitis B, Td, Zostavax, HCV  Electrocardiogram  Cardiovascular disease screening  Colorectal cancer screening  Bone density screening  Diabetes screening  Glaucoma screening  Mammography/PAP  Nutrition counseling  Smoking cessation counseling  Patient Instructions (the written plan) were given to the patient.    Varney Biles, LPN   09/09/3433    Reviewed above information.  Agree with plan.  Dr Nicki Reaper

## 2016-07-13 NOTE — Patient Instructions (Addendum)
  Ms. Douthat , Thank you for taking time to come for your Medicare Wellness Visit. I appreciate your ongoing commitment to your health goals. Please review the following plan we discussed and let me know if I can assist you in the future.   FOLLOW UP WITH DR. Nicki Reaper AS NEEDED.  These are the goals we discussed: Goals    . Increase water intake          STAY HYDRATED AND DRINK PLENTY OF FLUIDS    . Low Carb Foods          Lean meats, vegetables.  Educational material provided.       This is a list of the screening recommended for you and due dates:  Health Maintenance  Topic Date Due  .  Hepatitis C: One time screening is recommended by Center for Disease Control  (CDC) for  adults born from [birth year] through 1967.   1951/06/17  . Mammogram  06/22/2016  . Tetanus Vaccine  08/08/2019  . Colon Cancer Screening  07/14/2020  . Flu Shot  Completed  . DEXA scan (bone density measurement)  Completed  . Shingles Vaccine  Completed  . Pneumonia vaccines  Completed

## 2016-07-13 NOTE — Telephone Encounter (Signed)
Pt drop off airline form from their insurance to get reimbursed from not being able to take trip due to being sick. Form is in color folder up front. Thank you!

## 2016-07-14 DIAGNOSIS — R2681 Unsteadiness on feet: Secondary | ICD-10-CM | POA: Diagnosis not present

## 2016-07-21 DIAGNOSIS — R2681 Unsteadiness on feet: Secondary | ICD-10-CM | POA: Diagnosis not present

## 2016-07-24 ENCOUNTER — Ambulatory Visit
Admission: RE | Admit: 2016-07-24 | Discharge: 2016-07-24 | Disposition: A | Discharge status: Home / Self Care | Payer: Medicare Other | Source: Ambulatory Visit | Attending: Internal Medicine | Admitting: Internal Medicine

## 2016-07-24 DIAGNOSIS — Z1231 Encounter for screening mammogram for malignant neoplasm of breast: Secondary | ICD-10-CM | POA: Diagnosis not present

## 2016-07-26 DIAGNOSIS — R2681 Unsteadiness on feet: Secondary | ICD-10-CM | POA: Diagnosis not present

## 2016-08-04 ENCOUNTER — Other Ambulatory Visit: Payer: Self-pay | Admitting: Internal Medicine

## 2016-08-11 DIAGNOSIS — R2681 Unsteadiness on feet: Secondary | ICD-10-CM | POA: Diagnosis not present

## 2016-08-13 IMAGING — MG MM DIGITAL SCREENING BILAT W/ CAD
1 series · 4 of 4 positions shown · non-contrast
Comparison: Previous exam(s).

CLINICAL DATA: Screening.

EXAM:
DIGITAL SCREENING BILATERAL MAMMOGRAM WITH CAD

[L CC · left · 4 of 4 slices shown]
[im 1/4]
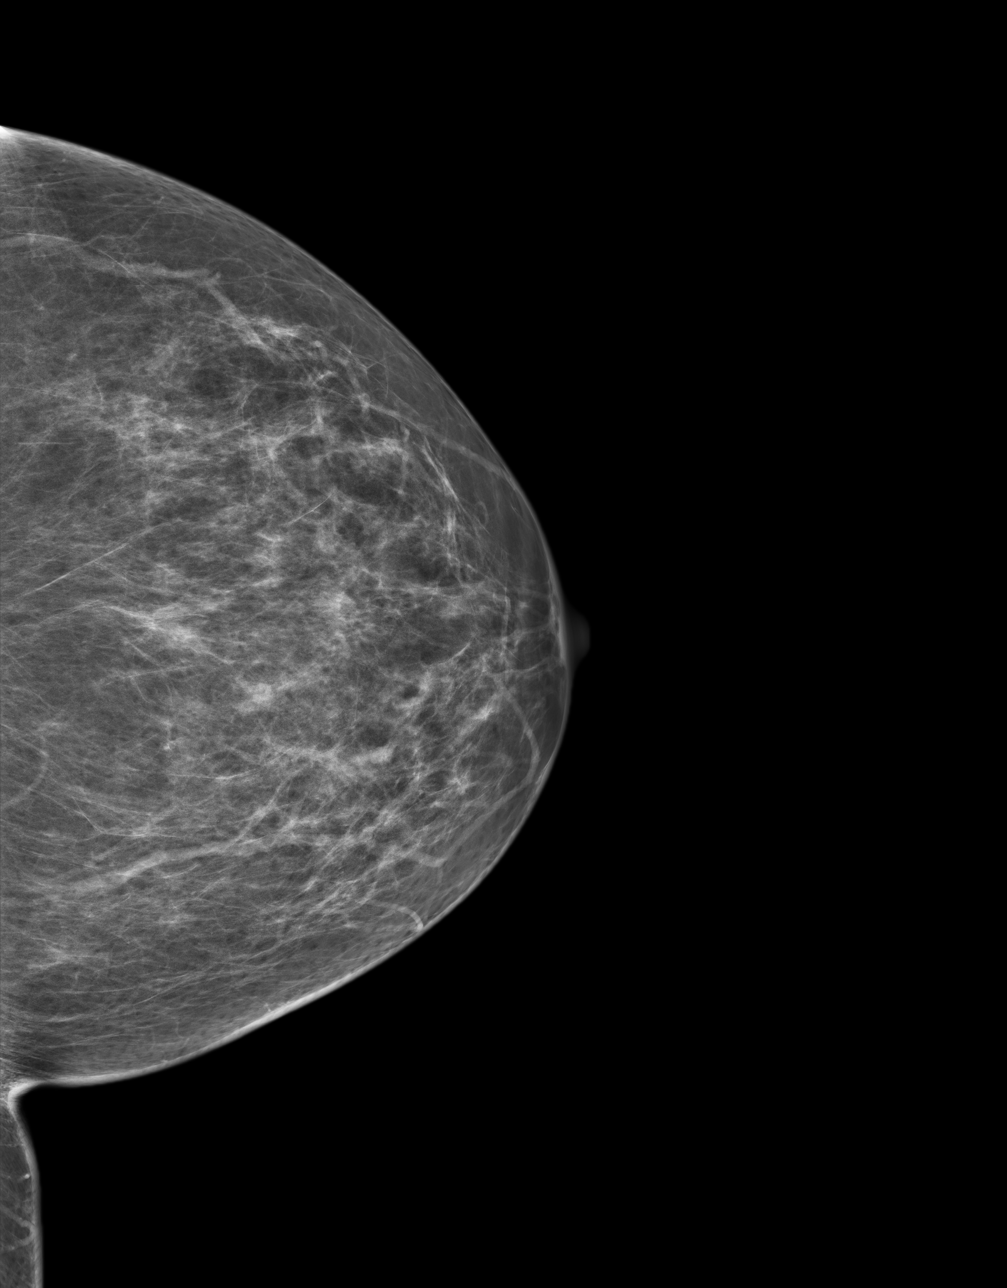
[im 2/4]
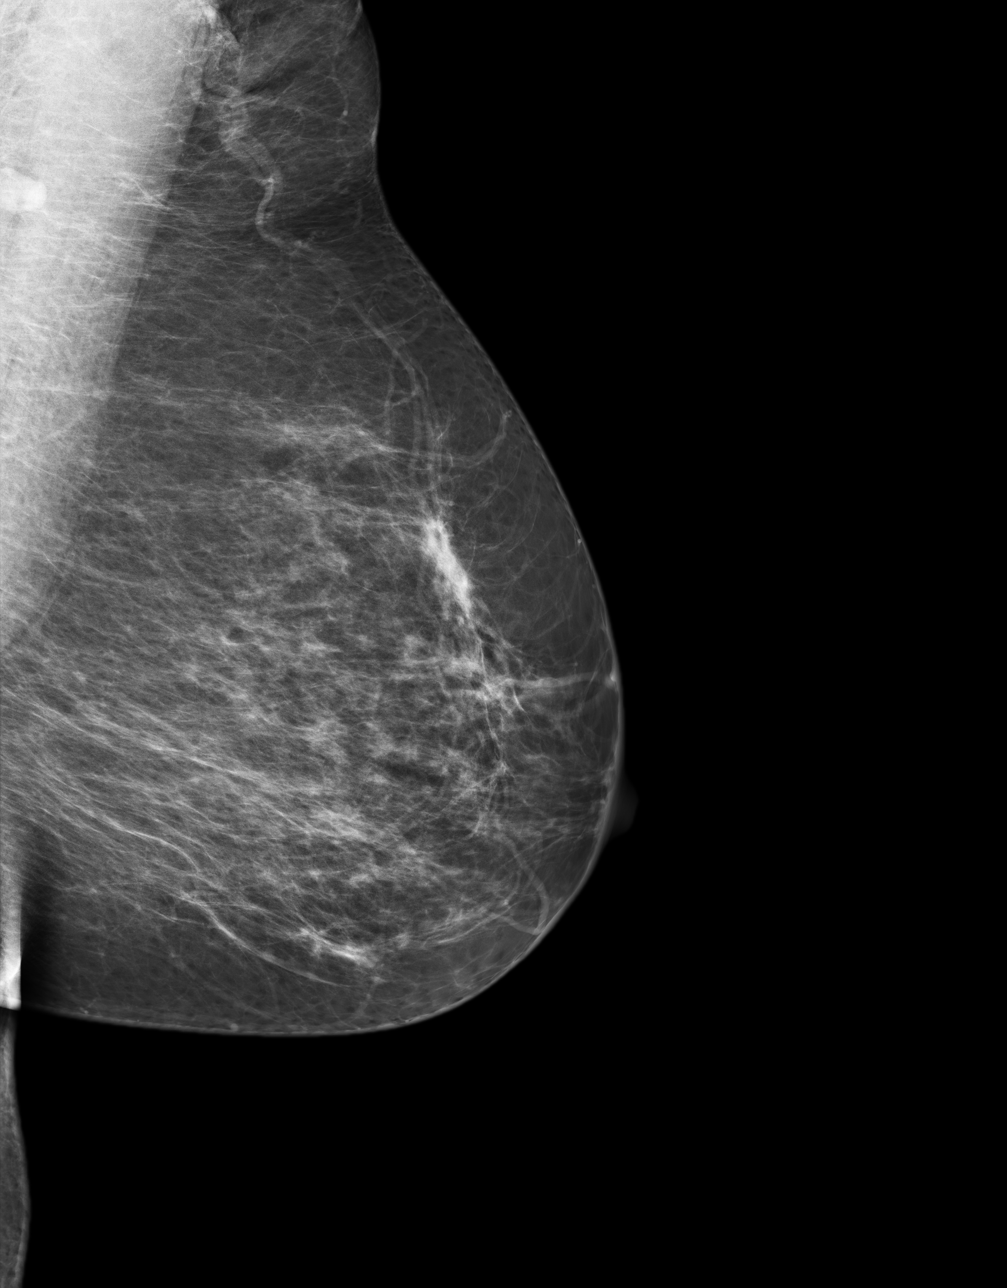
[im 3/4]
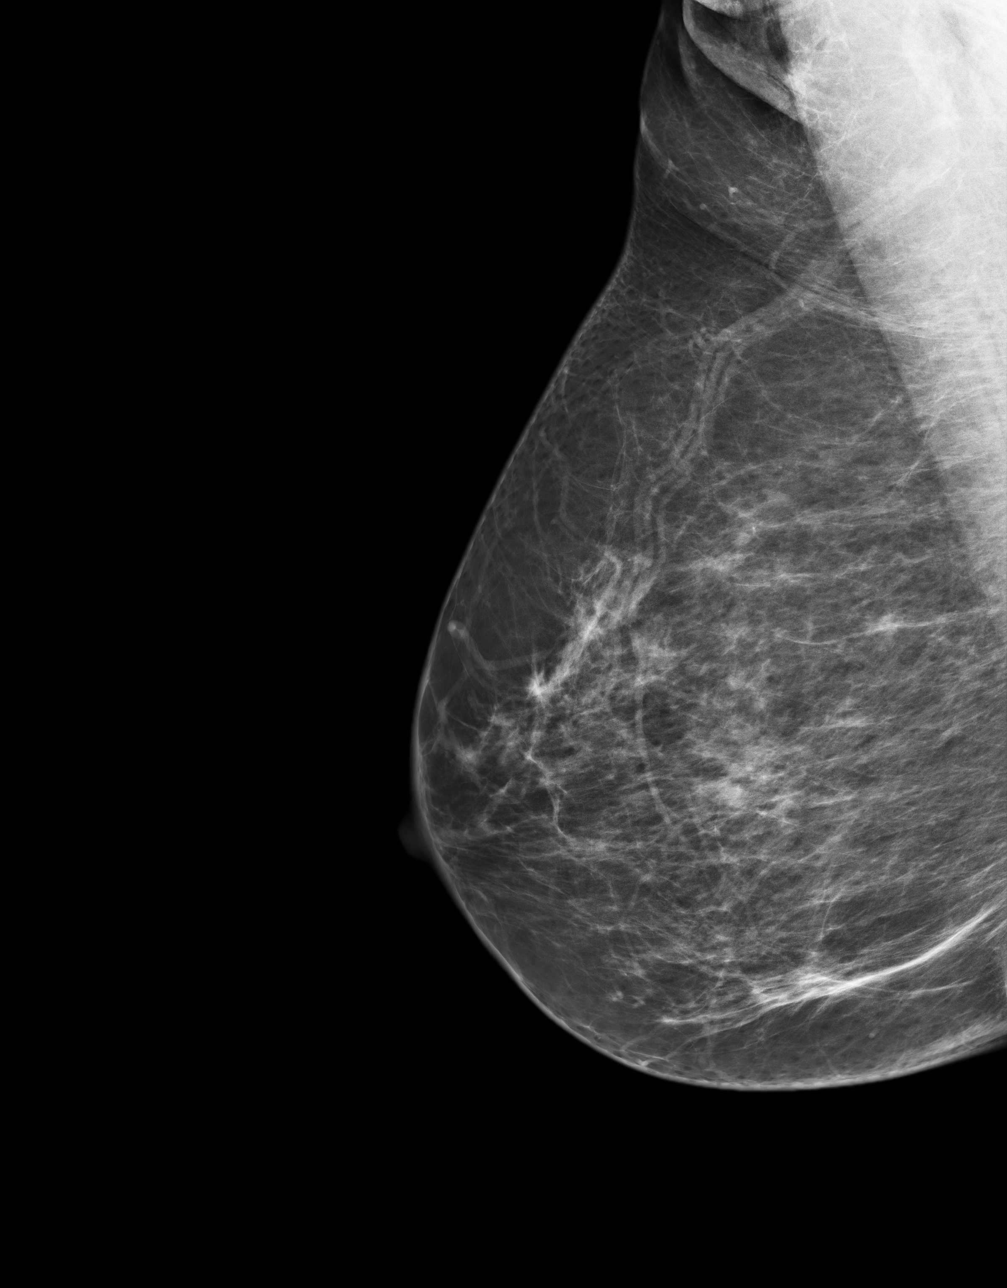
[im 4/4]
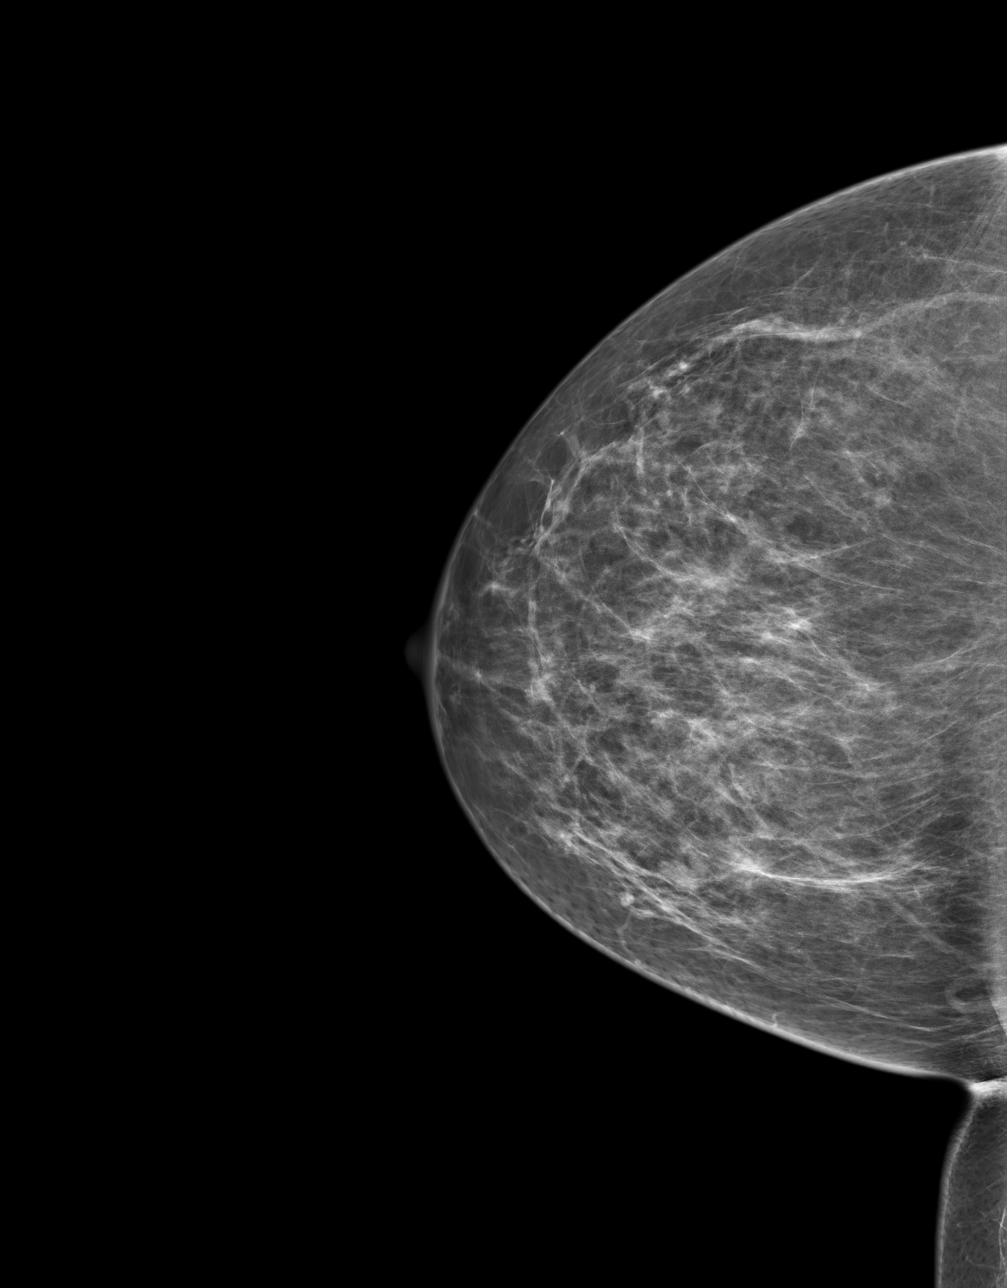

[4 of 4 positions shown; findings below may reference images not displayed]

ACR Breast Density Category b: There are scattered areas of
fibroglandular density.
FINDINGS: There are no findings suspicious for malignancy. Images were
processed with CAD.
IMPRESSION: No mammographic evidence of malignancy. A result letter of this
screening mammogram will be mailed directly to the patient.

RECOMMENDATION:
Screening mammogram in one year. (Code:AS-G-LCT)

BI-RADS CATEGORY  1: Negative.

## 2016-08-18 DIAGNOSIS — R2681 Unsteadiness on feet: Secondary | ICD-10-CM | POA: Diagnosis not present

## 2016-08-25 DIAGNOSIS — R2681 Unsteadiness on feet: Secondary | ICD-10-CM | POA: Diagnosis not present

## 2016-09-01 DIAGNOSIS — R2681 Unsteadiness on feet: Secondary | ICD-10-CM | POA: Diagnosis not present

## 2016-09-08 DIAGNOSIS — R2681 Unsteadiness on feet: Secondary | ICD-10-CM | POA: Diagnosis not present

## 2016-09-13 ENCOUNTER — Ambulatory Visit (INDEPENDENT_AMBULATORY_CARE_PROVIDER_SITE_OTHER): Payer: Medicare Other | Admitting: Internal Medicine

## 2016-09-13 ENCOUNTER — Encounter: Payer: Self-pay | Admitting: Internal Medicine

## 2016-09-13 ENCOUNTER — Encounter (INDEPENDENT_AMBULATORY_CARE_PROVIDER_SITE_OTHER): Payer: Self-pay

## 2016-09-13 VITALS — BP 110/62 | HR 98 | Temp 98.6°F | Ht 65.0 in | Wt 191.6 lb

## 2016-09-13 DIAGNOSIS — E7800 Pure hypercholesterolemia, unspecified: Secondary | ICD-10-CM | POA: Diagnosis not present

## 2016-09-13 DIAGNOSIS — Z124 Encounter for screening for malignant neoplasm of cervix: Secondary | ICD-10-CM | POA: Diagnosis not present

## 2016-09-13 DIAGNOSIS — I10 Essential (primary) hypertension: Secondary | ICD-10-CM

## 2016-09-13 LAB — BASIC METABOLIC PANEL
BUN: 13 mg/dL (ref 6–23)
CHLORIDE: 104 meq/L (ref 96–112)
CO2: 28 meq/L (ref 19–32)
Calcium: 9.4 mg/dL (ref 8.4–10.5)
Creatinine, Ser: 1.07 mg/dL (ref 0.40–1.20)
GFR: 65.61 mL/min (ref >60.00)
Glucose, Bld: 87 mg/dL (ref 70–99)
POTASSIUM: 4.5 meq/L (ref 3.5–5.1)
SODIUM: 139 meq/L (ref 135–145)

## 2016-09-13 LAB — LIPID PANEL
CHOL/HDL RATIO: 3
Cholesterol: 209 mg/dL — ABNORMAL HIGH (ref 0–200)
HDL: 74.8 mg/dL (ref >39.00)
LDL Cholesterol: 126 mg/dL — ABNORMAL HIGH (ref 0–99)
NONHDL: 133.78
Triglycerides: 40 mg/dL (ref 0.0–149.0)
VLDL: 8 mg/dL (ref 0.0–40.0)

## 2016-09-13 LAB — TSH: TSH: 1.18 u[IU]/mL (ref 0.35–4.50)

## 2016-09-13 MED ORDER — LISINOPRIL 10 MG PO TABS: 10.0000 mg | ORAL_TABLET | Freq: Every day | ORAL | 4 refills | Status: DC

## 2016-09-13 NOTE — Progress Notes (Signed)
Pre-visit discussion using our clinic review tool. No additional management support is needed unless otherwise documented below in the visit note.  

## 2016-09-13 NOTE — Progress Notes (Signed)
Patient ID: Janice Floyd, female   DOB: [birth year], 66 y.o.   MRN: [id number]   Subjective:    Patient ID: Janice Floyd, female    DOB: 19-Dec-1950, 66 y.o.   MRN: [id number]  HPI  Patient here for a scheduled follow up.  Doing much better.  Almost back to normal.  Going to vestibular rehab.  This has helped her a lot.  Back to doing yoga and plans to start exercising.  No chest pain.  No sob.  No abdominal pain or cramping.  Bowels stable.     Past Medical History:  Diagnosis Date  . Anemia    iron deficient  . Hypertension   . Vitamin D deficiency    Past Surgical History:  Procedure Laterality Date  . APPENDECTOMY    . COLONOSCOPY WITH PROPOFOL N/A 07/15/2015   Procedure: COLONOSCOPY WITH PROPOFOL;  Surgeon: Hulen Luster, MD;  Location: [facility]COPY;  Service: Gastroenterology;  Laterality: N/A;  . MYOMECTOMY     x2   Family History  Problem Relation Age of Onset  . Lung cancer Father   . Heart disease Mother     congestive heart failure  . Hypertension Mother   . Lung cancer Brother   . Diabetes Brother   . Colon polyps Sister   . Breast cancer Neg Hx    Social History   Social History  . Marital status: Married    Spouse name: N/A  . Number of children: N/A  . Years of education: N/A   Social History Main Topics  . Smoking status: Former Research scientist (life sciences)  . Smokeless tobacco: Never Used     Comment: quit smoking 1990  . Alcohol use 0.0 oz/week     Comment: glass of wine per day  . Drug use: No  . Sexual activity: Not Currently   Other Topics Concern  . None   Social History Narrative  . None    Outpatient Encounter Prescriptions as of 09/13/2016  Medication Sig  . Cholecalciferol (VITAMIN D-3) 1000 UNITS CAPS Take 1,000 Units by mouth daily.   . fluticasone (FLONASE) 50 MCG/ACT nasal spray Place 1 spray into both nostrils as needed for allergies or rhinitis.  Marland Kitchen lisinopril (PRINIVIL,ZESTRIL) 10 MG tablet Take 1 tablet (10 mg total) by mouth daily.  . vitamin E  400 UNIT capsule Take 400 Units by mouth daily.  . [DISCONTINUED] lisinopril (PRINIVIL,ZESTRIL) 10 MG tablet TAKE 1 TABLET (10 MG TOTAL) BY MOUTH DAILY.   No facility-administered encounter medications on file as of 09/13/2016.     Review of Systems  Constitutional: Negative for appetite change and unexpected weight change.       Dizziness better.   HENT: Negative for congestion and sinus pressure.   Respiratory: Negative for cough, chest tightness and shortness of breath.   Cardiovascular: Negative for chest pain, palpitations and leg swelling.  Gastrointestinal: Negative for abdominal pain, diarrhea, nausea and vomiting.  Genitourinary: Negative for difficulty urinating and dysuria.  Musculoskeletal: Negative for back pain and joint swelling.  Skin: Negative for color change and rash.  Neurological: Negative for headaches.       Dizziness better.   Psychiatric/Behavioral: Negative for agitation and dysphoric mood.       Objective:    Physical Exam  Constitutional: She appears well-developed and well-nourished. No distress.  HENT:  Nose: Nose normal.  Mouth/Throat: Oropharynx is clear and moist.  Neck: Neck supple. No thyromegaly present.  Cardiovascular: Normal rate and regular rhythm.  Pulmonary/Chest: Breath sounds normal. No respiratory distress. She has no wheezes.  Abdominal: Soft. Bowel sounds are normal. There is no tenderness.  Musculoskeletal: She exhibits no edema or tenderness.  Lymphadenopathy:    She has no cervical adenopathy.  Skin: No rash noted. No erythema.  Psychiatric: She has a normal mood and affect. Her behavior is normal.    BP 110/62 (BP Location: Left Arm, Patient Position: Sitting, Cuff Size: Large)   Pulse 98   Temp 98.6 F (37 C) (Oral)   Ht 5\' 5"  (1.651 m)   Wt 191 lb 9.6 oz (86.9 kg)   LMP 10/15/1999   SpO2 (!) 65%   BMI 31.88 kg/m  Wt Readings from Last 3 Encounters:  09/13/16 191 lb 9.6 oz (86.9 kg)  07/13/16 190 lb (86.2 kg)    06/27/16 190 lb 3.2 oz (86.3 kg)     Lab Results  Component Value Date   WBC 8.0 04/07/2016   HGB 13.9 04/07/2016   HCT 41.8 04/07/2016   PLT 234 04/07/2016   GLUCOSE 87 09/13/2016   CHOL 209 (H) 09/13/2016   TRIG 40.0 09/13/2016   HDL 74.80 09/13/2016   LDLCALC 126 (H) 09/13/2016   ALT 9 (L) 04/07/2016   AST 19 04/07/2016   NA 139 09/13/2016   K 4.5 09/13/2016   CL 104 09/13/2016   CREATININE 1.07 09/13/2016   BUN 13 09/13/2016   CO2 28 09/13/2016   TSH 1.18 09/13/2016    Mm Screening Breast Tomo Bilateral  Result Date: 07/25/2016 CLINICAL DATA:  Screening. EXAM: 2D DIGITAL SCREENING BILATERAL MAMMOGRAM WITH CAD AND ADJUNCT TOMO COMPARISON:  Previous exam(s). ACR Breast Density Category b: There are scattered areas of fibroglandular density. FINDINGS: There are no findings suspicious for malignancy. Images were processed with CAD. IMPRESSION: No mammographic evidence of malignancy. A result letter of this screening mammogram will be mailed directly to the patient. RECOMMENDATION: Screening mammogram in one year. (Code:SM-B-01Y) BI-RADS CATEGORY  1: Negative. Electronically Signed   By: Marin Olp M.D.   On: 07/25/2016 12:17       Assessment & Plan:   Problem List Items Addressed This Visit    Essential hypertension, benign    Blood pressure under good control.  Continue same medication regimen.  Follow pressures.  Follow metabolic panel.        Relevant Medications   lisinopril (PRINIVIL,ZESTRIL) 10 MG tablet   Other Relevant Orders   TSH (Completed)   Basic metabolic panel (Completed)   Hypercholesterolemia    Low cholesterol diet and exercise.  Follow lipid panel.        Relevant Medications   lisinopril (PRINIVIL,ZESTRIL) 10 MG tablet   Other Relevant Orders   Lipid panel (Completed)    Other Visit Diagnoses    Screening for cervical cancer    -  Primary       Einar Pheasant, MD

## 2016-09-22 DIAGNOSIS — R2681 Unsteadiness on feet: Secondary | ICD-10-CM | POA: Diagnosis not present

## 2016-09-24 ENCOUNTER — Encounter: Payer: Self-pay | Admitting: Internal Medicine

## 2016-09-24 NOTE — Assessment & Plan Note (Signed)
Low cholesterol diet and exercise.  Follow lipid panel.   

## 2016-09-24 NOTE — Assessment & Plan Note (Signed)
Blood pressure under good control.  Continue same medication regimen.  Follow pressures.  Follow metabolic panel.   

## 2016-10-06 DIAGNOSIS — R2681 Unsteadiness on feet: Secondary | ICD-10-CM | POA: Diagnosis not present

## 2017-01-17 ENCOUNTER — Encounter: Payer: Self-pay | Admitting: Internal Medicine

## 2017-01-17 ENCOUNTER — Ambulatory Visit (INDEPENDENT_AMBULATORY_CARE_PROVIDER_SITE_OTHER): Payer: Medicare Other | Admitting: Internal Medicine

## 2017-01-17 DIAGNOSIS — R42 Dizziness and giddiness: Secondary | ICD-10-CM | POA: Diagnosis not present

## 2017-01-17 DIAGNOSIS — E7800 Pure hypercholesterolemia, unspecified: Secondary | ICD-10-CM

## 2017-01-17 DIAGNOSIS — E559 Vitamin D deficiency, unspecified: Secondary | ICD-10-CM | POA: Diagnosis not present

## 2017-01-17 DIAGNOSIS — I10 Essential (primary) hypertension: Secondary | ICD-10-CM

## 2017-01-17 DIAGNOSIS — E669 Obesity, unspecified: Secondary | ICD-10-CM | POA: Diagnosis not present

## 2017-01-17 LAB — LIPID PANEL
CHOL/HDL RATIO: 3
CHOLESTEROL: 188 mg/dL (ref 0–200)
HDL: 66.2 mg/dL (ref >39.00)
LDL CALC: 112 mg/dL — AB (ref 0–99)
NonHDL: 121.43
Triglycerides: 45 mg/dL (ref 0.0–149.0)
VLDL: 9 mg/dL (ref 0.0–40.0)

## 2017-01-17 LAB — HEPATIC FUNCTION PANEL
ALK PHOS: 49 U/L (ref 39–117)
ALT: 7 U/L (ref 0–35)
AST: 14 U/L (ref 0–37)
Albumin: 4.1 g/dL (ref 3.5–5.2)
BILIRUBIN TOTAL: 0.5 mg/dL (ref 0.2–1.2)
Bilirubin, Direct: 0.1 mg/dL (ref 0.0–0.3)
Total Protein: 7.3 g/dL (ref 6.0–8.3)

## 2017-01-17 LAB — BASIC METABOLIC PANEL
BUN: 17 mg/dL (ref 6–23)
CHLORIDE: 105 meq/L (ref 96–112)
CO2: 31 meq/L (ref 19–32)
Calcium: 9.6 mg/dL (ref 8.4–10.5)
Creatinine, Ser: 1.21 mg/dL — ABNORMAL HIGH (ref 0.40–1.20)
GFR: 56.87 mL/min — ABNORMAL LOW (ref >60.00)
GLUCOSE: 89 mg/dL (ref 70–99)
POTASSIUM: 4.8 meq/L (ref 3.5–5.1)
SODIUM: 140 meq/L (ref 135–145)

## 2017-01-17 LAB — VITAMIN D 25 HYDROXY (VIT D DEFICIENCY, FRACTURES): VITD: 43.96 ng/mL (ref 30.00–100.00)

## 2017-01-17 NOTE — Assessment & Plan Note (Signed)
Has a history of vitamin D deficiency.  Recheck vitamin D level today.

## 2017-01-17 NOTE — Assessment & Plan Note (Signed)
Low cholesterol diet and exercise.  Discussed cholesterol medication with her.  Will check today.  If persistent elevation, consider starting crestor.

## 2017-01-17 NOTE — Assessment & Plan Note (Signed)
Blood pressure under good control.  Continue same medication regimen.  Follow pressures.  Follow metabolic panel.   

## 2017-01-17 NOTE — Progress Notes (Signed)
Patient ID: Janice Floyd, female   DOB: Nov 22, 1950, 66 y.o.   MRN: [id number]   Subjective:    Patient ID: Janice Floyd, female    DOB: 22-Jan-1951, 66 y.o.   MRN: [id number]  HPI  Patient here for a scheduled follow up.  Recently diagnosed with vestibular dysfunction.  Saw ENT.  She went to vestibular therapy.  She is doing well.  Still having some issues with dizziness, especially if she turns fast.  Discussed with her today.  Would like to return to rehab to see if can get the symptoms to completely resolve.  No chest pain.  Has not been exercising as much.  Plans to start.  Discussed cholesterol.  Will check today.  If persistent elevation, discussed starting medication.  No sob.  No acid reflux.  No abdominal pain.  Bowels moving.     Past Medical History:  Diagnosis Date  . Anemia    iron deficient  . Hypertension   . Vitamin D deficiency    Past Surgical History:  Procedure Laterality Date  . APPENDECTOMY    . COLONOSCOPY WITH PROPOFOL N/A 07/15/2015   Procedure: COLONOSCOPY WITH PROPOFOL;  Surgeon: Hulen Luster, MD;  [facility];  Service: Gastroenterology;  Laterality: N/A;  . MYOMECTOMY     x2   Family History  Problem Relation Age of Onset  . Lung cancer Father   . Heart disease Mother        congestive heart failure  . Hypertension Mother   . Lung cancer Brother   . Diabetes Brother   . Colon polyps Sister   . Breast cancer Neg Hx    Social History   Social History  . Marital status: Married    Spouse name: N/A  . Number of children: N/A  . Years of education: N/A   Social History Main Topics  . Smoking status: Former Research scientist (life sciences)  . Smokeless tobacco: Never Used     Comment: quit smoking 1990  . Alcohol use 0.0 oz/week     Comment: glass of wine per day  . Drug use: No  . Sexual activity: Not Currently   Other Topics Concern  . None   Social History Narrative  . None    Outpatient Encounter Prescriptions as of 01/17/2017  Medication Sig  .  Cholecalciferol (VITAMIN D-3) 1000 UNITS CAPS Take 1,000 Units by mouth daily.   . fluticasone (FLONASE) 50 MCG/ACT nasal spray Place 1 spray into both nostrils as needed for allergies or rhinitis.  Marland Kitchen lisinopril (PRINIVIL,ZESTRIL) 10 MG tablet Take 1 tablet (10 mg total) by mouth daily.  . vitamin E 400 UNIT capsule Take 400 Units by mouth daily.   No facility-administered encounter medications on file as of 01/17/2017.     Review of Systems  Constitutional: Negative for appetite change and unexpected weight change.  HENT: Negative for congestion and sinus pressure.   Respiratory: Negative for cough, chest tightness and shortness of breath.   Cardiovascular: Negative for chest pain, palpitations and leg swelling.  Gastrointestinal: Negative for abdominal pain, diarrhea, nausea and vomiting.  Genitourinary: Negative for difficulty urinating and dysuria.  Musculoskeletal: Negative for back pain and joint swelling.  Skin: Negative for color change and rash.  Neurological: Positive for dizziness. Negative for headaches.  Psychiatric/Behavioral: Negative for agitation and dysphoric mood.       Objective:     Blood pressure rechecked by me:  118/78  Physical Exam  Constitutional: She appears well-developed and well-nourished.  No distress.  HENT:  Nose: Nose normal.  Mouth/Throat: Oropharynx is clear and moist.  Neck: Neck supple. No thyromegaly present.  Cardiovascular: Normal rate and regular rhythm.   Pulmonary/Chest: Breath sounds normal. No respiratory distress. She has no wheezes.  Abdominal: Soft. Bowel sounds are normal. There is no tenderness.  Musculoskeletal: She exhibits no edema or tenderness.  Lymphadenopathy:    She has no cervical adenopathy.  Skin: No rash noted. No erythema.  Psychiatric: She has a normal mood and affect. Her behavior is normal.    BP 126/84 (BP Location: Left Arm, Patient Position: Sitting, Cuff Size: Normal)   Pulse 61   Temp 97.9 F (36.6 C)  (Oral)   Resp 16   Ht 5\' 5"  (1.651 m)   Wt 192 lb 4 oz (87.2 kg)   LMP 10/15/1999   SpO2 98%   BMI 31.99 kg/m  Wt Readings from Last 3 Encounters:  01/17/17 192 lb 4 oz (87.2 kg)  09/13/16 191 lb 9.6 oz (86.9 kg)  07/13/16 190 lb (86.2 kg)     Lab Results  Component Value Date   WBC 8.0 04/07/2016   HGB 13.9 04/07/2016   HCT 41.8 04/07/2016   PLT 234 04/07/2016   GLUCOSE 89 01/17/2017   CHOL 188 01/17/2017   TRIG 45.0 01/17/2017   HDL 66.20 01/17/2017   LDLCALC 112 (H) 01/17/2017   ALT 7 01/17/2017   AST 14 01/17/2017   NA 140 01/17/2017   K 4.8 01/17/2017   CL 105 01/17/2017   CREATININE 1.21 (H) 01/17/2017   BUN 17 01/17/2017   CO2 31 01/17/2017   TSH 1.18 09/13/2016    Mm Screening Breast Tomo Bilateral  Result Date: 07/25/2016 CLINICAL DATA:  Screening. EXAM: 2D DIGITAL SCREENING BILATERAL MAMMOGRAM WITH CAD AND ADJUNCT TOMO COMPARISON:  Previous exam(s). ACR Breast Density Category b: There are scattered areas of fibroglandular density. FINDINGS: There are no findings suspicious for malignancy. Images were processed with CAD. IMPRESSION: No mammographic evidence of malignancy. A result letter of this screening mammogram will be mailed directly to the patient. RECOMMENDATION: Screening mammogram in one year. (Code:SM-B-01Y) BI-RADS CATEGORY  1: Negative. Electronically Signed   By: Marin Olp M.D.   On: 07/25/2016 12:17       Assessment & Plan:   Problem List Items Addressed This Visit    Dizziness    See previous notes.  Saw ENT.  Had vestibular rehab.  Helped.  With persistent dizziness.  Pt request return to ENT for further treatment and evaluation to see if can get complete resolution.  Instructed her to continue the exercise and maneuvers at home.        Relevant Orders   Ambulatory referral to ENT   Essential hypertension, benign    Blood pressure under good control.  Continue same medication regimen.  Follow pressures.  Follow metabolic panel.          Relevant Orders   Basic metabolic panel (Completed)   Hypercholesterolemia    Low cholesterol diet and exercise.  Discussed cholesterol medication with her.  Will check today.  If persistent elevation, consider starting crestor.        Relevant Orders   Lipid panel (Completed)   Hepatic function panel (Completed)   Obesity (BMI 30-39.9)    Diet and exercise.  Follow.       Vitamin D deficiency    Has a history of vitamin D deficiency.  Recheck vitamin D level today.  Relevant Orders   VITAMIN D 25 Hydroxy (Vit-D Deficiency, Fractures) (Completed)       Einar Pheasant, MD

## 2017-01-20 ENCOUNTER — Encounter: Payer: Self-pay | Admitting: Internal Medicine

## 2017-01-20 NOTE — Assessment & Plan Note (Signed)
Diet and exercise.  Follow.  

## 2017-01-20 NOTE — Assessment & Plan Note (Signed)
See previous notes.  Saw ENT.  Had vestibular rehab.  Helped.  With persistent dizziness.  Pt request return to ENT for further treatment and evaluation to see if can get complete resolution.  Instructed her to continue the exercise and maneuvers at home.

## 2017-01-22 ENCOUNTER — Other Ambulatory Visit: Payer: Self-pay | Admitting: Internal Medicine

## 2017-01-22 ENCOUNTER — Other Ambulatory Visit: Payer: Self-pay

## 2017-01-22 DIAGNOSIS — R7989 Other specified abnormal findings of blood chemistry: Secondary | ICD-10-CM

## 2017-01-22 MED ORDER — ROSUVASTATIN CALCIUM 10 MG PO TABS: 10.0000 mg | ORAL_TABLET | Freq: Every day | ORAL | 0 refills | Status: DC

## 2017-01-22 NOTE — Progress Notes (Signed)
Order placed for f/u met b.  

## 2017-02-01 ENCOUNTER — Other Ambulatory Visit: Payer: Self-pay | Admitting: Internal Medicine

## 2017-02-01 ENCOUNTER — Other Ambulatory Visit (INDEPENDENT_AMBULATORY_CARE_PROVIDER_SITE_OTHER): Payer: Medicare Other

## 2017-02-01 DIAGNOSIS — R7989 Other specified abnormal findings of blood chemistry: Secondary | ICD-10-CM

## 2017-02-01 DIAGNOSIS — E7800 Pure hypercholesterolemia, unspecified: Secondary | ICD-10-CM

## 2017-02-01 LAB — BASIC METABOLIC PANEL
BUN: 16 mg/dL (ref 6–23)
CHLORIDE: 103 meq/L (ref 96–112)
CO2: 30 meq/L (ref 19–32)
CREATININE: 1.24 mg/dL — AB (ref 0.40–1.20)
Calcium: 9.9 mg/dL (ref 8.4–10.5)
GFR: 55.28 mL/min — ABNORMAL LOW (ref >60.00)
Glucose, Bld: 134 mg/dL — ABNORMAL HIGH (ref 70–99)
POTASSIUM: 4.4 meq/L (ref 3.5–5.1)
Sodium: 140 mEq/L (ref 135–145)

## 2017-02-01 NOTE — Progress Notes (Signed)
Order placed for f/u labs.  

## 2017-03-05 ENCOUNTER — Other Ambulatory Visit (INDEPENDENT_AMBULATORY_CARE_PROVIDER_SITE_OTHER): Payer: Medicare Other

## 2017-03-05 DIAGNOSIS — E7800 Pure hypercholesterolemia, unspecified: Secondary | ICD-10-CM | POA: Diagnosis not present

## 2017-03-05 DIAGNOSIS — R7989 Other specified abnormal findings of blood chemistry: Secondary | ICD-10-CM | POA: Diagnosis not present

## 2017-03-05 LAB — URINALYSIS, ROUTINE W REFLEX MICROSCOPIC
Bilirubin Urine: NEGATIVE
Ketones, ur: NEGATIVE
Leukocytes, UA: NEGATIVE
Nitrite: NEGATIVE
PH: 5.5 (ref 5.0–8.0)
SPECIFIC GRAVITY, URINE: 1.025 (ref 1.000–1.030)
Total Protein, Urine: NEGATIVE
Urine Glucose: NEGATIVE
Urobilinogen, UA: 0.2 (ref 0.0–1.0)

## 2017-03-05 LAB — HEPATIC FUNCTION PANEL
ALBUMIN: 4 g/dL (ref 3.5–5.2)
ALK PHOS: 45 U/L (ref 39–117)
ALT: 7 U/L (ref 0–35)
AST: 15 U/L (ref 0–37)
Bilirubin, Direct: 0.2 mg/dL (ref 0.0–0.3)
TOTAL PROTEIN: 7.3 g/dL (ref 6.0–8.3)
Total Bilirubin: 0.7 mg/dL (ref 0.2–1.2)

## 2017-03-05 LAB — BASIC METABOLIC PANEL
BUN: 15 mg/dL (ref 6–23)
CHLORIDE: 103 meq/L (ref 96–112)
CO2: 29 mEq/L (ref 19–32)
CREATININE: 1.1 mg/dL (ref 0.40–1.20)
Calcium: 9.3 mg/dL (ref 8.4–10.5)
GFR: 63.46 mL/min (ref >60.00)
GLUCOSE: 123 mg/dL — AB (ref 70–99)
POTASSIUM: 4.2 meq/L (ref 3.5–5.1)
SODIUM: 137 meq/L (ref 135–145)

## 2017-03-06 ENCOUNTER — Telehealth: Payer: Self-pay | Admitting: Internal Medicine

## 2017-03-06 NOTE — Telephone Encounter (Signed)
For you -

## 2017-03-06 NOTE — Telephone Encounter (Signed)
See result note.  

## 2017-03-06 NOTE — Telephone Encounter (Signed)
Pt is returning phone call from office. She thinks it is about her labs. Please call her at [phone].

## 2017-03-07 ENCOUNTER — Encounter: Payer: Self-pay | Admitting: Urology

## 2017-03-07 ENCOUNTER — Ambulatory Visit (INDEPENDENT_AMBULATORY_CARE_PROVIDER_SITE_OTHER): Payer: Medicare Other | Admitting: Urology

## 2017-03-07 ENCOUNTER — Other Ambulatory Visit: Payer: Self-pay | Admitting: Internal Medicine

## 2017-03-07 VITALS — BP 129/81 | HR 84 | Ht 65.0 in | Wt 185.0 lb

## 2017-03-07 DIAGNOSIS — R319 Hematuria, unspecified: Secondary | ICD-10-CM | POA: Diagnosis not present

## 2017-03-07 DIAGNOSIS — R3129 Other microscopic hematuria: Secondary | ICD-10-CM

## 2017-03-07 LAB — URINALYSIS, COMPLETE
BILIRUBIN UA: NEGATIVE
Glucose, UA: NEGATIVE
KETONES UA: NEGATIVE
LEUKOCYTES UA: NEGATIVE
NITRITE UA: NEGATIVE
Protein, UA: NEGATIVE
SPEC GRAV UA: 1.015 (ref 1.005–1.030)
Urobilinogen, Ur: 0.2 mg/dL (ref 0.2–1.0)
pH, UA: 7 (ref 5.0–7.5)

## 2017-03-07 LAB — MICROSCOPIC EXAMINATION
Bacteria, UA: NONE SEEN
EPITHELIAL CELLS (NON RENAL): NONE SEEN /HPF (ref 0–10)
WBC, UA: NONE SEEN /hpf (ref 0–?)

## 2017-03-07 NOTE — Progress Notes (Signed)
03/07/2017 3:21 PM   Janice Floyd Mar 07, 1951 [id number]  Referring provider: Einar Pheasant, [address] [place], [place] [address]  Chief Complaint  Patient presents with  . Hematuria    New Patient    HPI: 66 year old female referred for further evaluation of microscopic hematuria. She underwent routine urinalysis as part as her annual labs. This was performed on 03/05/2017. UA showed moderate blood on dipstick with 3-6 red blood cells per high-powered field. The urine was otherwise unremarkable.  No recent cross-sectional imaging of abd/ pelvis.    Former smoker, quit 20 years ago.  Previously smoked less than 1/2 ppd for 20 years.    No family history of GU malignancy.    No history of recent UTIs.    She denies any significant urinary symptoms including no dysuria, gross hematuria, urgency, frequency, or incontinence.  PMH: Past Medical History:  Diagnosis Date  . Anemia    iron deficient  . Depression   . Hyperlipemia   . Hypertension   . Vitamin D deficiency     Surgical History: Past Surgical History:  Procedure Laterality Date  . APPENDECTOMY    . COLONOSCOPY WITH PROPOFOL N/A 07/15/2015   Procedure: COLONOSCOPY WITH PROPOFOL;  Surgeon: Hulen Luster, MD;  Location: [facility] ENDOSCOPY;  Service: Gastroenterology;  Laterality: N/A;  . MYOMECTOMY     x2  . UTERINE FIBROID SURGERY  1978    Home Medications:  Allergies as of 03/07/2017   No Known Allergies     Medication List       Accurate as of 03/07/17 11:59 PM. Always use your most recent med list.          fluticasone 50 MCG/ACT nasal spray Commonly known as:  FLONASE Place 1 spray into both nostrils as needed for allergies or rhinitis.   lisinopril 10 MG tablet Commonly known as:  PRINIVIL,ZESTRIL Take 1 tablet (10 mg total) by mouth daily.   rosuvastatin 10 MG tablet Commonly known as:  CRESTOR Take 1 tablet (10 mg total) by mouth daily.   Vitamin D-3 1000 units  Caps Take 1,000 Units by mouth daily.   vitamin E 400 UNIT capsule Take 400 Units by mouth daily.       Allergies: No Known Allergies  Family History: Family History  Problem Relation Age of Onset  . Lung cancer Father   . Heart disease Mother        congestive heart failure  . Hypertension Mother   . Lung cancer Brother   . Diabetes Brother   . Colon polyps Sister   . Breast cancer Neg Hx   . Prostate cancer Neg Hx   . Kidney cancer Neg Hx     Social History:  reports that she has quit smoking. She has never used smokeless tobacco. She reports that she drinks alcohol. She reports that she does not use drugs.  ROS: UROLOGY Frequent Urination?: No Hard to postpone urination?: No Burning/pain with urination?: No Get up at night to urinate?: Yes Leakage of urine?: No Urine stream starts and stops?: No Trouble starting stream?: No Do you have to strain to urinate?: No Blood in urine?: Yes Urinary tract infection?: No Sexually transmitted disease?: No Injury to kidneys or bladder?: No Painful intercourse?: No Weak stream?: No Currently pregnant?: No Vaginal bleeding?: No Last menstrual period?: n  Gastrointestinal Nausea?: No Vomiting?: No Indigestion/heartburn?: No Diarrhea?: No Constipation?: No  Constitutional Fever: No Night sweats?: No Weight loss?: No Fatigue?:  No  Skin Skin rash/lesions?: No Itching?: No  Eyes Blurred vision?: No Double vision?: No  Ears/Nose/Throat Sore throat?: No Sinus problems?: No  Hematologic/Lymphatic Swollen glands?: No Easy bruising?: No  Cardiovascular Leg swelling?: No Chest pain?: No  Respiratory Cough?: No Shortness of breath?: No  Endocrine Excessive thirst?: No  Musculoskeletal Back pain?: No Joint pain?: No  Neurological Dizziness?: No  Psychologic Depression?: No Anxiety?: No  Physical Exam: BP 129/81   Pulse 84   Ht 5\' 5"  (1.651 m)   Wt 185 lb (83.9 kg)   LMP 10/15/1999   BMI  30.79 kg/m   Constitutional:  Alert and oriented, No acute distress. HEENT: [place] AT, moist mucus membranes.  Trachea midline, no masses. Cardiovascular: No clubbing, cyanosis, or edema. Respiratory: Normal respiratory effort, no increased work of breathing. GI: Abdomen is soft, nontender, nondistended, no abdominal masses GU: No CVA tenderness.  Skin: No rashes, bruises or suspicious lesions. Neurologic: Grossly intact, no focal deficits, moving all 4 extremities. Psychiatric: Normal mood and affect.  Laboratory Data: Lab Results  Component Value Date   WBC 8.0 04/07/2016   HGB 13.9 04/07/2016   HCT 41.8 04/07/2016   MCV 84.6 04/07/2016   PLT 234 04/07/2016    Lab Results  Component Value Date   CREATININE 1.10 03/05/2017    Urinalysis Results for orders placed or performed in visit on 03/07/17  Microscopic Examination  Result Value Ref Range   WBC, UA None seen 0 - 5 /hpf   RBC, UA 3-10 (A) 0 - 2 /hpf   Epithelial Cells (non renal) None seen 0 - 10 /hpf   Bacteria, UA None seen None seen/Few  Urinalysis, Complete  Result Value Ref Range   Specific Gravity, UA 1.015 1.005 - 1.030   pH, UA 7.0 5.0 - 7.5   Color, UA Yellow Yellow   Appearance Ur Clear Clear   Leukocytes, UA Negative Negative   Protein, UA Negative Negative/Trace   Glucose, UA Negative Negative   Ketones, UA Negative Negative   RBC, UA 2+ (A) Negative   Bilirubin, UA Negative Negative   Urobilinogen, Ur 0.2 0.2 - 1.0 mg/dL   Nitrite, UA Negative Negative   Microscopic Examination See below:    Pertinent Imaging: pending  Assessment & Plan:    1. Microscopic hematuria We discussed the differential diagnosis for microscopic hematuria including nephrolithiasis, renal or upper tract tumors, bladder stones, UTIs, or bladder tumors as well as undetermined etiologies. Per AUA guidelines, I did recommend complete microscopic hematuria evaluation including CTU, possible urine cytology, and office  cystoscopy.  - Urinalysis, Complete - CT HEMATURIA WORKUP; Future   2. History of smoking Risk factor for bladder cancer   Return in about 4 weeks (around 04/04/2017) for cystoscopy.  Hollice Espy, MD  [facility] [address], [place], [place] [address] [phone]

## 2017-03-07 NOTE — Progress Notes (Signed)
Order placed for urology referral.  

## 2017-03-23 DIAGNOSIS — H8190 Unspecified disorder of vestibular function, unspecified ear: Secondary | ICD-10-CM | POA: Diagnosis not present

## 2017-03-26 ENCOUNTER — Ambulatory Visit
Admission: RE | Admit: 2017-03-26 | Discharge: 2017-03-26 | Disposition: A | Discharge status: Home / Self Care | Payer: Medicare Other | Source: Ambulatory Visit | Attending: Urology | Admitting: Urology

## 2017-03-26 ENCOUNTER — Telehealth: Payer: Self-pay | Admitting: Family Medicine

## 2017-03-26 DIAGNOSIS — R319 Hematuria, unspecified: Secondary | ICD-10-CM | POA: Insufficient documentation

## 2017-03-26 MED ORDER — IOPAMIDOL (ISOVUE-300) INJECTION 61%
125.0000 mL | Freq: Once | INTRAVENOUS | Status: AC | PRN
  Administered 2017-03-26: 125 mL via INTRAVENOUS

## 2017-03-26 NOTE — Telephone Encounter (Signed)
Left message for patient to return call.

## 2017-03-26 NOTE — Telephone Encounter (Signed)
-----   Message from Hollice Espy, MD sent at 03/26/2017  9:37 AM EDT ----- Please let her know CT scan looks good.  Plan for f/u cysto as planned.

## 2017-03-26 NOTE — Telephone Encounter (Signed)
Patient notified

## 2017-04-06 DIAGNOSIS — H8190 Unspecified disorder of vestibular function, unspecified ear: Secondary | ICD-10-CM | POA: Diagnosis not present

## 2017-04-11 ENCOUNTER — Ambulatory Visit (INDEPENDENT_AMBULATORY_CARE_PROVIDER_SITE_OTHER): Payer: Medicare Other | Admitting: Urology

## 2017-04-11 ENCOUNTER — Encounter: Payer: Self-pay | Admitting: Urology

## 2017-04-11 VITALS — BP 136/86 | HR 90 | Ht 65.0 in | Wt 191.0 lb

## 2017-04-11 DIAGNOSIS — R3129 Other microscopic hematuria: Secondary | ICD-10-CM

## 2017-04-11 DIAGNOSIS — N329 Bladder disorder, unspecified: Secondary | ICD-10-CM

## 2017-04-11 LAB — URINALYSIS, COMPLETE
Bilirubin, UA: NEGATIVE
GLUCOSE, UA: NEGATIVE
Ketones, UA: NEGATIVE
LEUKOCYTES UA: NEGATIVE
Nitrite, UA: NEGATIVE
Protein, UA: NEGATIVE
Specific Gravity, UA: 1.025 (ref 1.005–1.030)
UUROB: 0.2 mg/dL (ref 0.2–1.0)
pH, UA: 5.5 (ref 5.0–7.5)

## 2017-04-11 MED ORDER — LIDOCAINE HCL 2 % EX GEL: 1.0000 "application " | Freq: Once | CUTANEOUS | Status: DC

## 2017-04-11 MED ORDER — CIPROFLOXACIN HCL 500 MG PO TABS: 500.0000 mg | ORAL_TABLET | Freq: Once | ORAL | Status: DC

## 2017-04-11 NOTE — Progress Notes (Signed)
   04/11/17  CC: microscopic hematuria  HPI: 66 year old female with a history of microscopic hematuria who presents today for cystoscopy to complete her workup. She underwent CT urogram and a 22,018 which was unremarkable.  Former smoker, quit 20 years ago.  Previously smoked less than 1/2 ppd for 20 years.    No family history of GU malignancy.    No history of recent UTIs.    She denies any significant urinary symptoms including no dysuria, gross hematuria, urgency, frequency, or incontinence.  Blood pressure 136/86, pulse 90, height 5\' 5"  (1.651 m), weight 191 lb (86.6 kg), last menstrual period 10/15/1999. NED. A&Ox3.   No respiratory distress   Abd soft, NT, ND Normal external genitalia with patent urethral meatus  Cystoscopy Procedure Note  Patient identification was confirmed, informed consent was obtained, and patient was prepped using Betadine solution.  Lidocaine jelly was administered per urethral meatus.    Preoperative abx where received prior to procedure.    Procedure: - Flexible cystoscope introduced, without any difficulty.   - Thorough search of the bladder revealed:    normal urethral meatus    normal urothelium there than as below    no stones    no ulcers      Upon retroflexion, some fine papillary fronds are appreciated right at the bladder neck.Upon backing the scope to this level, these multiple fine frondular-like projections were seen at the junction of the proximal urethra and bladder neck.  - Ureteral orifices were normal in position and appearance.  Post-Procedure: - Patient tolerated the procedure well  Assessment/ Plan:  1. Bladder lesion Lesion at the bladder neck/proximal urethra identified, most likely benign polypoid lesions, however, I have recommended proceeding to the operating room for biopsy of this to ensure no pathology. Risks of the procedure including risk of anesthesia, dysuria, hematuria, incontinence were all discussed.  All questions were answered. Plan to proceed as above.  2. Microscopic hematuria Likely secondary to #1  Schedule bladder biopsy  Hollice Espy, MD

## 2017-04-12 ENCOUNTER — Other Ambulatory Visit: Payer: Self-pay | Admitting: Radiology

## 2017-04-12 ENCOUNTER — Telehealth: Payer: Self-pay | Admitting: Radiology

## 2017-04-12 DIAGNOSIS — N329 Bladder disorder, unspecified: Secondary | ICD-10-CM

## 2017-04-12 DIAGNOSIS — R3129 Other microscopic hematuria: Secondary | ICD-10-CM

## 2017-04-12 NOTE — Telephone Encounter (Signed)
Pt scheduled for bladder neck biopsy & urethral biopsy with Dr Erlene Quan on 04/16/17. Made pt aware of surgery date, pre-admit testing appt, to call Friday prior to surgery for arrival time to [facility] & post op appt with Dr Erlene Quan. Questions answered. Pt voices understanding.

## 2017-04-13 ENCOUNTER — Encounter
Admission: RE | Admit: 2017-04-13 | Discharge: 2017-04-13 | Disposition: A | Discharge status: Home / Self Care | Payer: Medicare Other | Source: Ambulatory Visit | Attending: Urology | Admitting: Urology

## 2017-04-13 DIAGNOSIS — F329 Major depressive disorder, single episode, unspecified: Secondary | ICD-10-CM | POA: Diagnosis not present

## 2017-04-13 DIAGNOSIS — Z87891 Personal history of nicotine dependence: Secondary | ICD-10-CM | POA: Diagnosis not present

## 2017-04-13 DIAGNOSIS — I10 Essential (primary) hypertension: Secondary | ICD-10-CM | POA: Diagnosis not present

## 2017-04-13 DIAGNOSIS — N3031 Trigonitis with hematuria: Secondary | ICD-10-CM | POA: Diagnosis not present

## 2017-04-13 DIAGNOSIS — N3081 Other cystitis with hematuria: Secondary | ICD-10-CM | POA: Diagnosis not present

## 2017-04-13 DIAGNOSIS — Z01810 Encounter for preprocedural cardiovascular examination: Secondary | ICD-10-CM | POA: Diagnosis not present

## 2017-04-13 LAB — URINALYSIS, ROUTINE W REFLEX MICROSCOPIC
Bacteria, UA: NONE SEEN
Bilirubin Urine: NEGATIVE
GLUCOSE, UA: NEGATIVE mg/dL
KETONES UR: NEGATIVE mg/dL
NITRITE: NEGATIVE
PH: 5 (ref 5.0–8.0)
PROTEIN: NEGATIVE mg/dL
Specific Gravity, Urine: 1.02 (ref 1.005–1.030)

## 2017-04-13 LAB — CBC
HCT: 40 % (ref 35.0–47.0)
Hemoglobin: 13.2 g/dL (ref 12.0–16.0)
MCH: 28.5 pg (ref 26.0–34.0)
MCHC: 32.9 g/dL (ref 32.0–36.0)
MCV: 86.5 fL (ref 80.0–100.0)
Platelets: 204 10*3/uL (ref 150–440)
RBC: 4.63 MIL/uL (ref 3.80–5.20)
RDW: 14.8 % — AB (ref 11.5–14.5)
WBC: 6.3 10*3/uL (ref 3.6–11.0)

## 2017-04-13 NOTE — Patient Instructions (Signed)
Your procedure is scheduled on: Monday, April 16, 2017 Report to Same Day Surgery on the 2nd floor in the [facility]. To find out your arrival time, please call [phone] between 1PM - 3PM on: Friday, April 13, 2017  REMEMBER: Instructions that are not followed completely may result in serious medical risk up to and including death; or upon the discretion of your surgeon and anesthesiologist your surgery may need to be rescheduled.  Do not eat food or drink liquids after midnight. No gum chewing or hard candies.  You may however, drink CLEAR liquids up to 2 hours before you are scheduled to arrive at the hospital for your procedure.  Do not drink clear liquids within 2 hours of your scheduled arrival to the hospital as this may lead to your procedure being delayed or rescheduled.  Clear liquids include: - water  - apple juice without pulp - clear gatorade - black coffee or tea (NO milk, creamers, sugars) DO NOT drink anything not on this list.  No Alcohol for 24 hours before or after surgery.  No Smoking for 24 hours prior to surgery.  Notify your doctor if there is any change in your medical condition (cold, fever, infection).  Do not wear jewelry, make-up, hairpins, clips or nail polish.  Do not wear lotions, powders, or perfumes.   Do not shave 48 hours prior to surgery.   Contacts and dentures may not be worn into surgery.  Do not bring valuables to the hospital. [facility] is not responsible for any belongings or valuables.   TAKE THESE MEDICATIONS THE MORNING OF SURGERY WITH A SIP OF WATER:  NONE  Stop Anti-inflammatories such as Advil, Aleve, Ibuprofen, Motrin, Naproxen, Naprosyn, Goodie powder, or aspirin products. (May take Tylenol if needed.)  Stop supplements until after surgery:  VITAMIN E (May continue Vitamin D.)  If you are being discharged the day of surgery, you will not be allowed to drive home. You will need someone to drive you home and  stay with you that night.   If you are taking public transportation, you will need to have a responsible adult to with you.  Please call the number above if you have any questions about these instructions.

## 2017-04-14 LAB — URINE CULTURE: CULTURE: NO GROWTH

## 2017-04-15 MED ORDER — CEFAZOLIN SODIUM-DEXTROSE 2-4 GM/100ML-% IV SOLN
2.0000 g | INTRAVENOUS | Status: AC
  Administered 2017-04-16: 2 g via INTRAVENOUS

## 2017-04-16 ENCOUNTER — Ambulatory Visit: Payer: Medicare Other | Admitting: Anesthesiology

## 2017-04-16 ENCOUNTER — Encounter
Admission: RE | Disposition: A | Discharge status: Home / Self Care | Payer: Self-pay | Source: Ambulatory Visit | Attending: Urology

## 2017-04-16 ENCOUNTER — Encounter: Payer: Self-pay | Admitting: *Deleted

## 2017-04-16 ENCOUNTER — Telehealth: Payer: Self-pay | Admitting: Radiology

## 2017-04-16 ENCOUNTER — Ambulatory Visit
Admission: RE | Admit: 2017-04-16 | Discharge: 2017-04-16 | Disposition: A | Discharge status: Home / Self Care | Payer: Medicare Other | Source: Ambulatory Visit | Attending: Urology | Admitting: Urology

## 2017-04-16 DIAGNOSIS — F329 Major depressive disorder, single episode, unspecified: Secondary | ICD-10-CM | POA: Diagnosis not present

## 2017-04-16 DIAGNOSIS — Z87891 Personal history of nicotine dependence: Secondary | ICD-10-CM | POA: Diagnosis not present

## 2017-04-16 DIAGNOSIS — N3081 Other cystitis with hematuria: Secondary | ICD-10-CM | POA: Insufficient documentation

## 2017-04-16 DIAGNOSIS — D494 Neoplasm of unspecified behavior of bladder: Secondary | ICD-10-CM | POA: Diagnosis not present

## 2017-04-16 DIAGNOSIS — R3129 Other microscopic hematuria: Secondary | ICD-10-CM

## 2017-04-16 DIAGNOSIS — N3031 Trigonitis with hematuria: Secondary | ICD-10-CM | POA: Insufficient documentation

## 2017-04-16 DIAGNOSIS — I10 Essential (primary) hypertension: Secondary | ICD-10-CM | POA: Diagnosis not present

## 2017-04-16 DIAGNOSIS — N329 Bladder disorder, unspecified: Secondary | ICD-10-CM | POA: Diagnosis not present

## 2017-04-16 DIAGNOSIS — N3091 Cystitis, unspecified with hematuria: Secondary | ICD-10-CM | POA: Diagnosis not present

## 2017-04-16 DIAGNOSIS — R311 Benign essential microscopic hematuria: Secondary | ICD-10-CM | POA: Diagnosis not present

## 2017-04-16 DIAGNOSIS — D649 Anemia, unspecified: Secondary | ICD-10-CM | POA: Diagnosis not present

## 2017-04-16 HISTORY — PX: CYSTOSCOPY WITH BIOPSY: SHX5122

## 2017-04-16 SURGERY — CYSTOSCOPY, WITH BIOPSY
Anesthesia: General | Site: Ureter | Wound class: Clean

## 2017-04-16 MED ORDER — MIDAZOLAM HCL 2 MG/2ML IJ SOLN
INTRAMUSCULAR | Status: DC | PRN
  Administered 2017-04-16: 2 mg via INTRAVENOUS

## 2017-04-16 MED ORDER — MIDAZOLAM HCL 2 MG/2ML IJ SOLN
INTRAMUSCULAR | Status: AC
  Filled 2017-04-16: qty 2

## 2017-04-16 MED ORDER — LACTATED RINGERS IV SOLN
INTRAVENOUS | Status: DC
  Administered 2017-04-16: 07:00:00 via INTRAVENOUS

## 2017-04-16 MED ORDER — FAMOTIDINE 20 MG PO TABS
20.0000 mg | ORAL_TABLET | Freq: Once | ORAL | Status: AC
  Administered 2017-04-16: 20 mg via ORAL

## 2017-04-16 MED ORDER — PHENYLEPHRINE HCL 10 MG/ML IJ SOLN
INTRAMUSCULAR | Status: DC | PRN
  Administered 2017-04-16: 100 ug via INTRAVENOUS

## 2017-04-16 MED ORDER — FENTANYL CITRATE (PF) 100 MCG/2ML IJ SOLN: 25.0000 ug | INTRAMUSCULAR | Status: DC | PRN

## 2017-04-16 MED ORDER — PROPOFOL 500 MG/50ML IV EMUL
INTRAVENOUS | Status: AC
  Filled 2017-04-16: qty 50

## 2017-04-16 MED ORDER — FAMOTIDINE 20 MG PO TABS
ORAL_TABLET | ORAL | Status: AC
  Filled 2017-04-16: qty 1

## 2017-04-16 MED ORDER — HYDROCODONE-ACETAMINOPHEN 5-325 MG PO TABS: 1.0000 | ORAL_TABLET | Freq: Four times a day (QID) | ORAL | 0 refills | Status: DC | PRN

## 2017-04-16 MED ORDER — FENTANYL CITRATE (PF) 100 MCG/2ML IJ SOLN
INTRAMUSCULAR | Status: DC | PRN
  Administered 2017-04-16 (×2): 50 ug via INTRAVENOUS

## 2017-04-16 MED ORDER — PHENAZOPYRIDINE HCL 200 MG PO TABS: 200.0000 mg | ORAL_TABLET | Freq: Three times a day (TID) | ORAL | 0 refills | Status: DC | PRN

## 2017-04-16 MED ORDER — OXYBUTYNIN CHLORIDE 5 MG PO TABS: 5.0000 mg | ORAL_TABLET | Freq: Three times a day (TID) | ORAL | 0 refills | Status: DC | PRN

## 2017-04-16 MED ORDER — LIDOCAINE HCL (PF) 2 % IJ SOLN
INTRAMUSCULAR | Status: AC
  Filled 2017-04-16: qty 6

## 2017-04-16 MED ORDER — DEXAMETHASONE SODIUM PHOSPHATE 10 MG/ML IJ SOLN
INTRAMUSCULAR | Status: DC | PRN
  Administered 2017-04-16: 10 mg via INTRAVENOUS

## 2017-04-16 MED ORDER — GLYCOPYRROLATE 0.2 MG/ML IJ SOLN
INTRAMUSCULAR | Status: DC | PRN
  Administered 2017-04-16: 0.2 mg via INTRAVENOUS

## 2017-04-16 MED ORDER — FENTANYL CITRATE (PF) 100 MCG/2ML IJ SOLN
INTRAMUSCULAR | Status: AC
  Filled 2017-04-16: qty 2

## 2017-04-16 MED ORDER — ONDANSETRON HCL 4 MG/2ML IJ SOLN: 4.0000 mg | Freq: Once | INTRAMUSCULAR | Status: DC | PRN

## 2017-04-16 MED ORDER — PROPOFOL 10 MG/ML IV BOLUS
INTRAVENOUS | Status: DC | PRN
  Administered 2017-04-16: 150 mg via INTRAVENOUS
  Administered 2017-04-16: 50 mg via INTRAVENOUS

## 2017-04-16 MED ORDER — ONDANSETRON HCL 4 MG/2ML IJ SOLN
INTRAMUSCULAR | Status: DC | PRN
  Administered 2017-04-16: 4 mg via INTRAVENOUS

## 2017-04-16 MED ORDER — LIDOCAINE HCL (CARDIAC) 20 MG/ML IV SOLN
INTRAVENOUS | Status: DC | PRN
  Administered 2017-04-16: 100 mg via INTRAVENOUS

## 2017-04-16 MED ORDER — CEFAZOLIN SODIUM-DEXTROSE 2-4 GM/100ML-% IV SOLN
INTRAVENOUS | Status: AC
  Filled 2017-04-16: qty 100

## 2017-04-16 MED ORDER — EPHEDRINE SULFATE 50 MG/ML IJ SOLN
INTRAMUSCULAR | Status: AC
  Filled 2017-04-16: qty 1

## 2017-04-16 MED ORDER — PHENYLEPHRINE HCL 10 MG/ML IJ SOLN
INTRAMUSCULAR | Status: AC
  Filled 2017-04-16: qty 1

## 2017-04-16 SURGICAL SUPPLY — 17 items
BAG DRAIN CYSTO-URO LG1000N (MISCELLANEOUS) ×3 IMPLANT
DRSG TELFA 4X3 1S NADH ST (GAUZE/BANDAGES/DRESSINGS) ×3 IMPLANT
ELECT REM PT RETURN 9FT ADLT (ELECTROSURGICAL) ×3
ELECTRODE REM PT RTRN 9FT ADLT (ELECTROSURGICAL) ×1 IMPLANT
GLOVE BIO SURGEON STRL SZ 6.5 (GLOVE) ×2 IMPLANT
GLOVE BIO SURGEONS STRL SZ 6.5 (GLOVE) ×1
GOWN STRL REUS W/ TWL LRG LVL3 (GOWN DISPOSABLE) ×2 IMPLANT
GOWN STRL REUS W/TWL LRG LVL3 (GOWN DISPOSABLE) ×4
KIT RM TURNOVER CYSTO AR (KITS) ×3 IMPLANT
NDL SAFETY ECLIPSE 18X1.5 (NEEDLE) ×1 IMPLANT
NEEDLE HYPO 18GX1.5 SHARP (NEEDLE) ×2
PACK CYSTO AR (MISCELLANEOUS) ×3 IMPLANT
SCRUB POVIDONE IODINE 4 OZ (MISCELLANEOUS) ×3 IMPLANT
SET CYSTO W/LG BORE CLAMP LF (SET/KITS/TRAYS/PACK) ×3 IMPLANT
SURGILUBE 2OZ TUBE FLIPTOP (MISCELLANEOUS) ×3 IMPLANT
WATER STERILE IRR 1000ML POUR (IV SOLUTION) ×3 IMPLANT
WATER STERILE IRR 3000ML UROMA (IV SOLUTION) ×3 IMPLANT

## 2017-04-16 NOTE — Anesthesia Postprocedure Evaluation (Signed)
Anesthesia Post Note  Patient: Janice Floyd  Procedure(s) Performed: Procedure(s) (LRB): CYSTOSCOPY WITH BLADDER NECK BIOPSY AND URETHRAL BIOPSY (N/A)  Patient location during evaluation: PACU Anesthesia Type: General Level of consciousness: awake and alert Pain management: pain level controlled Vital Signs Assessment: post-procedure vital signs reviewed and stable Respiratory status: spontaneous breathing, nonlabored ventilation, respiratory function stable and patient connected to nasal cannula oxygen Cardiovascular status: blood pressure returned to baseline and stable Postop Assessment: no signs of nausea or vomiting Anesthetic complications: no     Last Vitals:  Vitals:   04/16/17 0905 04/16/17 0937  BP: (!) 148/102 (!) 136/100  Pulse: 92 76  Resp: 16   Temp: (!) 35.9 C   SpO2: 100% 100%    Last Pain:  Vitals:   04/16/17 0905  TempSrc: Temporal  PainSc:                  Shiv Shuey S

## 2017-04-16 NOTE — Anesthesia Procedure Notes (Signed)
Procedure Name: LMA Insertion Date/Time: 04/16/2017 7:58 AM Performed by: Aline Brochure Pre-anesthesia Checklist: Patient identified, Emergency Drugs available, Suction available and Patient being monitored Patient Re-evaluated:Patient Re-evaluated prior to induction Oxygen Delivery Method: Circle system utilized Preoxygenation: Pre-oxygenation with 100% oxygen Induction Type: IV induction Ventilation: Mask ventilation without difficulty LMA: LMA inserted LMA Size: 3.5 Number of attempts: 1 Placement Confirmation: positive ETCO2 and breath sounds checked- equal and bilateral Tube secured with: Tape Dental Injury: Teeth and Oropharynx as per pre-operative assessment

## 2017-04-16 NOTE — Interval H&P Note (Signed)
History and Physical Interval Note:  04/16/2017 7:38 AM  Janice Floyd  has presented today for surgery, with the diagnosis of microhematuria,bladder neck lesion  The various methods of treatment have been discussed with the patient and family. After consideration of risks, benefits and other options for treatment, the patient has consented to  Procedure(s): CYSTOSCOPY WITH BLADDER NECK BIOPSY AND URETHRAL BIOPSY (N/A) as a surgical intervention .  The patient's history has been reviewed, patient examined, no change in status, stable for surgery.  I have reviewed the patient's chart and labs.  Questions were answered to the patient's satisfaction.    RRR CTAB  Hollice Espy

## 2017-04-16 NOTE — Anesthesia Preprocedure Evaluation (Signed)
Anesthesia Evaluation  Patient identified by MRN, date of birth, ID band Patient awake    Reviewed: Allergy & Precautions, NPO status , Patient's Chart, lab work & pertinent test results, reviewed documented beta blocker date and time   Airway Mallampati: III  TM Distance: >3 FB     Dental  (+) Chipped   Pulmonary former smoker,           Cardiovascular hypertension, Pt. on medications      Neuro/Psych PSYCHIATRIC DISORDERS Depression    GI/Hepatic   Endo/Other    Renal/GU      Musculoskeletal   Abdominal   Peds  Hematology  (+) anemia ,   Anesthesia Other Findings   Reproductive/Obstetrics                             Anesthesia Physical Anesthesia Plan  ASA: III  Anesthesia Plan: General   Post-op Pain Management:    Induction: Intravenous  PONV Risk Score and Plan:   Airway Management Planned: LMA  Additional Equipment:   Intra-op Plan:   Post-operative Plan:   Informed Consent: I have reviewed the patients History and Physical, chart, labs and discussed the procedure including the risks, benefits and alternatives for the proposed anesthesia with the patient or authorized representative who has indicated his/her understanding and acceptance.     Plan Discussed with: CRNA  Anesthesia Plan Comments:         Anesthesia Quick Evaluation

## 2017-04-16 NOTE — Telephone Encounter (Signed)
Pt called stating about an hour after she returned home after surgery this morning her top lip started to swell. Pt denies any other symptoms & states it is only the top lip that is swelling. Per Dr Erlene Quan, reassured pt & advised pt to go to the ER if swelling worsens or if she develops SOB. Pt voices understanding.

## 2017-04-16 NOTE — Op Note (Signed)
Date of procedure: 04/16/17  Preoperative diagnosis:  1. Microscopic hematuria 2. Bladder neck/proximal urethral lesion   Postoperative diagnosis:  1. Same as above   Procedure: 1. Cystoscopy 2. Bladder biopsy  Surgeon: Hollice Espy, MD  Anesthesia: General  Complications: None  Intraoperative findings: Frondular/ polypoid changes involving proximal urethra and bladder neck. Adequately sampled. Hemostasis achieved.  EBL: Minimal  Specimens: Bladder neck/proximal urethral biopsy  Drains: None  Indication: Janice Floyd is a 66 y.o. patient with gross Hematuria found to have a regular frondular type lesions involving the bladder neck and proximal urethra.  After reviewing the management options for treatment, she elected to proceed with the above surgical procedure(s). We have discussed the potential benefits and risks of the procedure, side effects of the proposed treatment, the likelihood of the patient achieving the goals of the procedure, and any potential problems that might occur during the procedure or recuperation. Informed consent has been obtained.  Description of procedure:  The patient was taken to the operating room and general anesthesia was induced.  The patient was placed in the dorsal lithotomy position, prepped and draped in the usual sterile fashion, and preoperative antibiotics were administered. A preoperative time-out was performed.   A 21 French scope was advanced per urethra into the bladder. The entire bladder was carefully inspected and noted to be free of any lesions, ulcerations, stones, or any other findings other than the aforementioned frondular/polypoid file projections extending from the proximal urethra and circumferentially around the bladder neck. The trigone was unremarkable. Clear urine was seen from each UO. Cold cup biopsies were then used to sample the proximal urethra/bladder neck lesions. Approximately 5 or 6 small biopsies were taken.  These were passed off the field in formalin. Electrocautery was then used to fulgurate the areas judiciously in order to achieve hemostasis. Once this is was achieved and no residual irregular areas were identified, the scope was removed after the bladder was drained. The patient was then cleaned and dried, repositioned the supine position, reversed from anesthesia, taken to the PACU in stable condition.  Plan: I'll call the patient with her pathology results as I'm most likely suspect benign polyps.  Hollice Espy, M.D.

## 2017-04-16 NOTE — Transfer of Care (Signed)
Immediate Anesthesia Transfer of Care Note  Patient: Janice Floyd  Procedure(s) Performed: Procedure(s): CYSTOSCOPY WITH BLADDER NECK BIOPSY AND URETHRAL BIOPSY (N/A)  Patient Location: PACU  Anesthesia Type:General  Level of Consciousness: awake  Airway & Oxygen Therapy: Patient connected to face mask oxygen  Post-op Assessment: Post -op Vital signs reviewed and stable  Post vital signs: stable  Last Vitals:  Vitals:   04/16/17 0827 04/16/17 0828  BP:  115/82  Pulse:  (!) 108  Resp:  (!) 23  Temp: (!) 36.4 C   SpO2:  100%    Last Pain:  Vitals:   04/16/17 0616  TempSrc: Tympanic         Complications: No apparent anesthesia complications

## 2017-04-16 NOTE — Anesthesia Post-op Follow-up Note (Signed)
Anesthesia QCDR form completed.        

## 2017-04-16 NOTE — Discharge Instructions (Signed)
Transurethral Resection of Bladder Tumor (TURBT) or Bladder Biopsy ° ° °Definition: ° Transurethral Resection of the Bladder Tumor is a surgical procedure used to diagnose and remove tumors within the bladder. TURBT is the most common treatment for early stage bladder cancer. ° °General instructions: °   ° Your recent bladder surgery requires very little post hospital care but some definite precautions. ° °Despite the fact that no skin incisions were used, the area around the bladder incisions are raw and covered with scabs to promote healing and prevent bleeding. Certain precautions are needed to insure that the scabs are not disturbed over the next 2-4 weeks while the healing proceeds. ° °Because the raw surface inside your bladder and the irritating effects of urine you may expect frequency of urination and/or urgency (a stronger desire to urinate) and perhaps even getting up at night more often. This will usually resolve or improve slowly over the healing period. You may see some blood in your urine over the first 6 weeks. Do not be alarmed, even if the urine was clear for a while. Get off your feet and drink lots of fluids until clearing occurs. If you start to pass clots or don't improve call us. ° °Diet: ° °You may return to your normal diet immediately. Because of the raw surface of your bladder, alcohol, spicy foods, foods high in acid and drinks with caffeine may cause irritation or frequency and should be used in moderation. To keep your urine flowing freely and avoid constipation, drink plenty of fluids during the day (8-10 glasses). Tip: Avoid cranberry juice because it is very acidic. ° °Activity: ° °Your physical activity doesn't need to be restricted. However, if you are very active, you may see some blood in the urine. We suggest that you reduce your activity under the circumstances until the bleeding has stopped. ° °Bowels: ° °It is important to keep your bowels regular during the postoperative  period. Straining with bowel movements can cause bleeding. A bowel movement every other day is reasonable. Use a mild laxative if needed, such as milk of magnesia 2-3 tablespoons, or 2 Dulcolax tablets. Call if you continue to have problems. If you had been taking narcotics for pain, before, during or after your surgery, you may be constipated. Take a laxative if necessary. ° ° ° °Medication: ° °You should resume your pre-surgery medications unless told not to. In addition you may be given an antibiotic to prevent or treat infection. Antibiotics are not always necessary. All medication should be taken as prescribed until the bottles are finished unless you are having an unusual reaction to one of the drugs. ° ° °[facility] °[address], [address] °[place], [place] [address] °[phone] ° ° ° ° °

## 2017-04-16 NOTE — Progress Notes (Signed)
Up to bathroom to void 

## 2017-04-16 NOTE — H&P (View-Only) (Signed)
   04/11/17  CC: microscopic hematuria  HPI: 66 year old female with a history of microscopic hematuria who presents today for cystoscopy to complete her workup. She underwent CT urogram and a 22,018 which was unremarkable.  Former smoker, quit 20 years ago.  Previously smoked less than 1/2 ppd for 20 years.    No family history of GU malignancy.    No history of recent UTIs.    She denies any significant urinary symptoms including no dysuria, gross hematuria, urgency, frequency, or incontinence.  Blood pressure 136/86, pulse 90, height 5\' 5"  (1.651 m), weight 191 lb (86.6 kg), last menstrual period 10/15/1999. NED. A&Ox3.   No respiratory distress   Abd soft, NT, ND Normal external genitalia with patent urethral meatus  Cystoscopy Procedure Note  Patient identification was confirmed, informed consent was obtained, and patient was prepped using Betadine solution.  Lidocaine jelly was administered per urethral meatus.    Preoperative abx where received prior to procedure.    Procedure: - Flexible cystoscope introduced, without any difficulty.   - Thorough search of the bladder revealed:    normal urethral meatus    normal urothelium there than as below    no stones    no ulcers      Upon retroflexion, some fine papillary fronds are appreciated right at the bladder neck.Upon backing the scope to this level, these multiple fine frondular-like projections were seen at the junction of the proximal urethra and bladder neck.  - Ureteral orifices were normal in position and appearance.  Post-Procedure: - Patient tolerated the procedure well  Assessment/ Plan:  1. Bladder lesion Lesion at the bladder neck/proximal urethra identified, most likely benign polypoid lesions, however, I have recommended proceeding to the operating room for biopsy of this to ensure no pathology. Risks of the procedure including risk of anesthesia, dysuria, hematuria, incontinence were all discussed.  All questions were answered. Plan to proceed as above.  2. Microscopic hematuria Likely secondary to #1  Schedule bladder biopsy  Hollice Espy, MD

## 2017-04-17 ENCOUNTER — Other Ambulatory Visit: Payer: Self-pay | Admitting: Internal Medicine

## 2017-04-17 ENCOUNTER — Encounter: Payer: Self-pay | Admitting: Urology

## 2017-04-17 LAB — SURGICAL PATHOLOGY

## 2017-04-18 ENCOUNTER — Telehealth: Payer: Self-pay | Admitting: Urology

## 2017-04-18 NOTE — Telephone Encounter (Signed)
Surgical pathology from bladder biopsy reviewed. No evidence of dysplasia or malignancy. Consistent with cystitis.  Follow-up when necessary. All questions answered.  Hollice Espy, MD

## 2017-04-24 ENCOUNTER — Ambulatory Visit: Payer: Medicare Other | Admitting: Urology

## 2017-05-24 ENCOUNTER — Ambulatory Visit (INDEPENDENT_AMBULATORY_CARE_PROVIDER_SITE_OTHER): Payer: Medicare Other | Admitting: Internal Medicine

## 2017-05-24 ENCOUNTER — Encounter: Payer: Self-pay | Admitting: Internal Medicine

## 2017-05-24 ENCOUNTER — Other Ambulatory Visit (HOSPITAL_COMMUNITY)
Admission: RE | Admit: 2017-05-24 | Discharge: 2017-05-24 | Disposition: A | Discharge status: Home / Self Care | Payer: Medicare Other | Source: Ambulatory Visit | Attending: Internal Medicine | Admitting: Internal Medicine

## 2017-05-24 VITALS — BP 148/82 | HR 80 | Temp 97.9°F | Resp 20 | Ht 64.96 in | Wt 193.6 lb

## 2017-05-24 DIAGNOSIS — E559 Vitamin D deficiency, unspecified: Secondary | ICD-10-CM | POA: Diagnosis not present

## 2017-05-24 DIAGNOSIS — E7800 Pure hypercholesterolemia, unspecified: Secondary | ICD-10-CM

## 2017-05-24 DIAGNOSIS — Z1231 Encounter for screening mammogram for malignant neoplasm of breast: Secondary | ICD-10-CM

## 2017-05-24 DIAGNOSIS — Z124 Encounter for screening for malignant neoplasm of cervix: Secondary | ICD-10-CM | POA: Diagnosis not present

## 2017-05-24 DIAGNOSIS — Z0000 Encounter for general adult medical examination without abnormal findings: Secondary | ICD-10-CM

## 2017-05-24 DIAGNOSIS — R319 Hematuria, unspecified: Secondary | ICD-10-CM | POA: Diagnosis not present

## 2017-05-24 DIAGNOSIS — I10 Essential (primary) hypertension: Secondary | ICD-10-CM | POA: Diagnosis not present

## 2017-05-24 DIAGNOSIS — Z1239 Encounter for other screening for malignant neoplasm of breast: Secondary | ICD-10-CM

## 2017-05-24 DIAGNOSIS — Z6832 Body mass index (BMI) 32.0-32.9, adult: Secondary | ICD-10-CM

## 2017-05-24 LAB — BASIC METABOLIC PANEL
BUN: 15 mg/dL (ref 6–23)
CHLORIDE: 103 meq/L (ref 96–112)
CO2: 31 meq/L (ref 19–32)
CREATININE: 1.07 mg/dL (ref 0.40–1.20)
Calcium: 9.7 mg/dL (ref 8.4–10.5)
GFR: 65.47 mL/min (ref >60.00)
GLUCOSE: 88 mg/dL (ref 70–99)
Potassium: 4.6 mEq/L (ref 3.5–5.1)
Sodium: 140 mEq/L (ref 135–145)

## 2017-05-24 LAB — LIPID PANEL
CHOL/HDL RATIO: 2
CHOLESTEROL: 137 mg/dL (ref 0–200)
HDL: 68 mg/dL (ref >39.00)
LDL Cholesterol: 60 mg/dL (ref 0–99)
NonHDL: 68.69
TRIGLYCERIDES: 43 mg/dL (ref 0.0–149.0)
VLDL: 8.6 mg/dL (ref 0.0–40.0)

## 2017-05-24 LAB — HEPATIC FUNCTION PANEL
ALT: 8 U/L (ref 0–35)
AST: 14 U/L (ref 0–37)
Albumin: 4 g/dL (ref 3.5–5.2)
Alkaline Phosphatase: 47 U/L (ref 39–117)
BILIRUBIN DIRECT: 0.2 mg/dL (ref 0.0–0.3)
BILIRUBIN TOTAL: 0.8 mg/dL (ref 0.2–1.2)
TOTAL PROTEIN: 7.3 g/dL (ref 6.0–8.3)

## 2017-05-24 NOTE — Assessment & Plan Note (Signed)
On crestor.  Low cholesterol diet and exercise.  Follow lipid panel and liver function tests.   

## 2017-05-24 NOTE — Assessment & Plan Note (Signed)
Physical today 05/24/17.  PAP 01/10/13 - negative with negative HPV.  Colonoscopy 12.8/16 - three polyps and diverticulosis.  Recommended f/u colonoscopy in five years.  Mammogram 07/25/16 - Birads I.  Scheduled for f/u mammogram.

## 2017-05-24 NOTE — Progress Notes (Signed)
Patient ID: Janice Floyd, female   DOB: [birth year], 67 y.o.   MRN: [id number]   Subjective:    Patient ID: Janice Floyd, female    DOB: 1950-12-07, 66 y.o.   MRN: [id number]  HPI  Patient here for her physical exam. Recently evaluated by Dr Lyla Son for hematuria.  Had CT scan - unrevealing.  S/p cystoscopy and bladder biopsy.  Biopsy negative for dysplasia or malignancy.  States f/u as needed.  Overall she feels she is doing well.  Tries to stay active.  No chest pain.  No sob.  No acid reflux.  No abdominal pain.  Bowels moving.  She did have recent lip swelling.  Noticed after anesthesia after procedure.  Was questioning possible latex allergy  - but unknown why had reaction.  Has not had swelling since.     Past Medical History:  Diagnosis Date  . Anemia    iron deficient  . Depression   . Hyperlipemia   . Hypertension   . Vitamin D deficiency    Past Surgical History:  Procedure Laterality Date  . APPENDECTOMY    . COLONOSCOPY WITH PROPOFOL N/A 07/15/2015   Procedure: COLONOSCOPY WITH PROPOFOL;  Surgeon: Hulen Luster, MD;  Location: [facility] ENDOSCOPY;  Service: Gastroenterology;  Laterality: N/A;  . CYSTOSCOPY WITH BIOPSY N/A 04/16/2017   Procedure: CYSTOSCOPY WITH BLADDER NECK BIOPSY AND URETHRAL BIOPSY;  Surgeon: Hollice Espy, MD;  Location: [facility] ORS;  Service: Urology;  Laterality: N/A;  . MYOMECTOMY     x2  . UTERINE FIBROID SURGERY  1978   Family History  Problem Relation Age of Onset  . Lung cancer Father   . Heart disease Mother        congestive heart failure  . Hypertension Mother   . Lung cancer Brother   . Diabetes Brother   . Colon polyps Sister   . Breast cancer Neg Hx   . Prostate cancer Neg Hx   . Kidney cancer Neg Hx    Social History   Social History  . Marital status: Married    Spouse name: N/A  . Number of children: N/A  . Years of education: N/A   Social History Main Topics  . Smoking status: Former Research scientist (life sciences)  . Smokeless tobacco: Never Used    Comment: quit smoking 1990  . Alcohol use 0.0 oz/week     Comment: glass of wine per day  . Drug use: No  . Sexual activity: Not Currently   Other Topics Concern  . None   Social History Narrative  . None    Outpatient Encounter Prescriptions as of 05/24/2017  Medication Sig  . Cholecalciferol (VITAMIN D) 2000 units CAPS Take 2,000 Units by mouth daily.  . fluticasone (FLONASE) 50 MCG/ACT nasal spray Place 1 spray into both nostrils as needed for allergies or rhinitis. (Patient taking differently: Place 2 sprays into both nostrils as needed for allergies or rhinitis. )  . HYDROcodone-acetaminophen (NORCO/VICODIN) 5-325 MG tablet Take 1-2 tablets by mouth every 6 (six) hours as needed for moderate pain.  Marland Kitchen lisinopril (PRINIVIL,ZESTRIL) 10 MG tablet Take 1 tablet (10 mg total) by mouth daily.  . rosuvastatin (CRESTOR) 10 MG tablet TAKE 1 TABLET BY MOUTH EVERY DAY  . vitamin E 400 UNIT capsule Take 400 Units by mouth daily.  . [DISCONTINUED] oxybutynin (DITROPAN) 5 MG tablet Take 1 tablet (5 mg total) by mouth every 8 (eight) hours as needed for bladder spasms.  . [DISCONTINUED] phenazopyridine (PYRIDIUM) 200 MG  tablet Take 1 tablet (200 mg total) by mouth 3 (three) times daily as needed for pain.   Facility-Administered Encounter Medications as of 05/24/2017  Medication  . ciprofloxacin (CIPRO) tablet 500 mg  . lidocaine (XYLOCAINE) 2 % jelly 1 application    Review of Systems  Constitutional: Negative for appetite change and unexpected weight change.  HENT: Negative for congestion and sinus pressure.   Eyes: Negative for pain and visual disturbance.  Respiratory: Negative for cough, chest tightness and shortness of breath.   Cardiovascular: Negative for chest pain, palpitations and leg swelling.  Gastrointestinal: Negative for abdominal pain, diarrhea, nausea and vomiting.  Genitourinary: Negative for difficulty urinating and dysuria.  Musculoskeletal: Negative for back pain and  joint swelling.  Skin: Negative for color change and rash.  Neurological: Negative for dizziness, light-headedness and headaches.  Hematological: Negative for adenopathy. Does not bruise/bleed easily.  Psychiatric/Behavioral: Negative for agitation and dysphoric mood.       Objective:    Physical Exam  Constitutional: She is oriented to person, place, and time. She appears well-developed and well-nourished. No distress.  HENT:  Nose: Nose normal.  Mouth/Throat: Oropharynx is clear and moist.  Eyes: Right eye exhibits no discharge. Left eye exhibits no discharge. No scleral icterus.  Neck: Neck supple. No thyromegaly present.  Cardiovascular: Normal rate and regular rhythm.   Pulmonary/Chest: Breath sounds normal. No accessory muscle usage. No tachypnea. No respiratory distress. She has no decreased breath sounds. She has no wheezes. She has no rhonchi. Right breast exhibits no inverted nipple, no mass, no nipple discharge and no tenderness (no axillary adenopathy). Left breast exhibits no inverted nipple, no mass, no nipple discharge and no tenderness (no axilarry adenopathy).  Abdominal: Soft. Bowel sounds are normal. There is no tenderness.  Musculoskeletal: She exhibits no edema or tenderness.  Lymphadenopathy:    She has no cervical adenopathy.  Neurological: She is alert and oriented to person, place, and time.  Skin: Skin is warm. No rash noted. No erythema.  Psychiatric: She has a normal mood and affect. Her behavior is normal.    BP (!) 148/82 (BP Location: Left Arm, Patient Position: Sitting, Cuff Size: Normal)   Pulse 80   Temp 97.9 F (36.6 C) (Oral)   Resp 20   Ht 5' 4.96" (1.65 m)   Wt 193 lb 9.6 oz (87.8 kg)   LMP 10/15/1999   SpO2 99%   BMI 32.26 kg/m  Wt Readings from Last 3 Encounters:  05/24/17 193 lb 9.6 oz (87.8 kg)  04/16/17 191 lb (86.6 kg)  04/13/17 191 lb (86.6 kg)     Lab Results  Component Value Date   WBC 6.3 04/13/2017   HGB 13.2 04/13/2017    HCT 40.0 04/13/2017   PLT 204 04/13/2017   GLUCOSE 88 05/24/2017   CHOL 137 05/24/2017   TRIG 43.0 05/24/2017   HDL 68.00 05/24/2017   LDLCALC 60 05/24/2017   ALT 8 05/24/2017   AST 14 05/24/2017   NA 140 05/24/2017   K 4.6 05/24/2017   CL 103 05/24/2017   CREATININE 1.07 05/24/2017   BUN 15 05/24/2017   CO2 31 05/24/2017   TSH 1.18 09/13/2016       Assessment & Plan:   Problem List Items Addressed This Visit    BMI 32.0-32.9,adult    Diet and exercise.  Follow.        Essential hypertension, benign    Blood pressure slightly increased.  Rechecked improved.  Have her spot  check her pressure.  Continue same medication.  Follow metabolic panel.  Get her back in soon to reassess.        Relevant Orders   Basic metabolic panel (Completed)   Health care maintenance    Physical today 05/24/17.  PAP 01/10/13 - negative with negative HPV.  Colonoscopy 12.8/16 - three polyps and diverticulosis.  Recommended f/u colonoscopy in five years.  Mammogram 07/25/16 - Birads I.  Scheduled for f/u mammogram.        Hematuria    Worked up by urology.  Had cystoscopy and biopsy.  States everything checked out fine.  Recommended f/u as needed.        Hypercholesterolemia    On crestor.  Low cholesterol diet and exercise.  Follow lipid panel and liver function tests.        Relevant Orders   Hepatic function panel (Completed)   Lipid panel (Completed)   Vitamin D deficiency    Vitamin D level checked 01/2017 - wnl.         Other Visit Diagnoses    Screening for breast cancer    -  Primary   Relevant Orders   MM DIGITAL SCREENING BILATERAL   Screening for cervical cancer       Relevant Orders   Cytology - PAP       Einar Pheasant, MD

## 2017-05-24 NOTE — Assessment & Plan Note (Signed)
Vitamin D level checked 01/2017 - wnl.

## 2017-05-25 ENCOUNTER — Encounter: Payer: Self-pay | Admitting: *Deleted

## 2017-05-27 ENCOUNTER — Encounter: Payer: Self-pay | Admitting: Internal Medicine

## 2017-05-27 NOTE — Assessment & Plan Note (Signed)
Blood pressure slightly increased.  Rechecked improved.  Have her spot check her pressure.  Continue same medication.  Follow metabolic panel.  Get her back in soon to reassess.

## 2017-05-27 NOTE — Assessment & Plan Note (Signed)
Diet and exercise.  Follow.  

## 2017-05-27 NOTE — Assessment & Plan Note (Signed)
Worked up by urology.  Had cystoscopy and biopsy.  States everything checked out fine.  Recommended f/u as needed.

## 2017-05-29 LAB — CYTOLOGY - PAP
Diagnosis: NEGATIVE
HPV (WINDOPATH): NOT DETECTED

## 2017-06-05 ENCOUNTER — Other Ambulatory Visit: Payer: Self-pay | Admitting: Internal Medicine

## 2017-07-12 ENCOUNTER — Other Ambulatory Visit: Payer: Self-pay | Admitting: Internal Medicine

## 2017-07-13 ENCOUNTER — Other Ambulatory Visit: Payer: Self-pay

## 2017-07-13 ENCOUNTER — Ambulatory Visit (INDEPENDENT_AMBULATORY_CARE_PROVIDER_SITE_OTHER): Payer: Medicare Other

## 2017-07-13 VITALS — BP 120/84 | HR 75 | Temp 97.9°F | Ht 65.0 in | Wt 193.0 lb

## 2017-07-13 DIAGNOSIS — Z1159 Encounter for screening for other viral diseases: Secondary | ICD-10-CM | POA: Diagnosis not present

## 2017-07-13 DIAGNOSIS — Z0000 Encounter for general adult medical examination without abnormal findings: Secondary | ICD-10-CM

## 2017-07-13 NOTE — Patient Instructions (Addendum)
  Ms. Sullivant , Thank you for taking time to come for your Medicare Wellness Visit. I appreciate your ongoing commitment to your health goals. Please review the following plan we discussed and let me know if I can assist you in the future.   Follow up with Dr. Nicki Reaper as needed.    Lab  Bring a copy of your Mutual and/or Living Will to be scanned into chart once completed.  Have a great day and Merry Christmas!  These are the goals we discussed: Goals    . INCREASE WATER INTAKE     Stay hydrated       This is a list of the screening recommended for you and due dates:  Health Maintenance  Topic Date Due  .  Hepatitis C: One time screening is recommended by Center for Disease Control  (CDC) for  adults born from [birth year] through 1967.   1950-11-08  . Mammogram  07/24/2017  . Tetanus Vaccine  08/08/2019  . Colon Cancer Screening  07/14/2020  . DEXA scan (bone density measurement)  Completed  . Pneumonia vaccines  Completed  . Flu Shot  Discontinued

## 2017-07-13 NOTE — Progress Notes (Signed)
Subjective:   TANESSA TIDD is a 66 y.o. female who presents for Medicare Annual (Subsequent) preventive examination.  Review of Systems:  No ROS.  Medicare Wellness Visit. Additional risk factors are reflected in the social history. Cardiac Risk Factors include: advanced age (>29men, >89 women);obesity (BMI >30kg/m2)     Objective:     Vitals: BP 120/84   Pulse 75   Temp 97.9 F (36.6 C) (Oral)   Ht 5\' 5"  (1.651 m)   Wt 193 lb (87.5 kg)   LMP 10/15/1999   SpO2 98%   BMI 32.12 kg/m   Body mass index is 32.12 kg/m.  Advanced Directives 07/13/2017 04/16/2017 04/13/2017 07/13/2016  Does Patient Have a Medical Advance Directive? No No No No  Would patient like information on creating a medical advance directive? Yes (MAU/Ambulatory/Procedural Areas - Information given) No - Patient declined No - Patient declined Yes (MAU/Ambulatory/Procedural Areas - Information given)    Tobacco Social History   Tobacco Use  Smoking Status Former Smoker  Smokeless Tobacco Never Used  Tobacco Comment   quit smoking 1990     Counseling given: Not Answered Comment: quit smoking 1990   Clinical Intake:  Pre-visit preparation completed: Yes  Pain : No/denies pain     Nutritional Status: BMI > 30  Obese Diabetes: No  Activities of Daily Living: Independent Ambulation: Independent Medication Administration: Independent Home Management: Independent     Do you feel unsafe in your current relationship?: No Do you feel physically threatened by others?: No Anyone hurting you at home, work, or school?: No Unable to ask?: No  How often do you need to have someone help you when you read instructions, pamphlets, or other written materials from your doctor or pharmacy?: 1 - Never  Interpreter Needed?: No     Past Medical History:  Diagnosis Date  . Anemia    iron deficient  . Depression   . Hyperlipemia   . Hypertension   . Vitamin D deficiency    Past Surgical History:    Procedure Laterality Date  . APPENDECTOMY    . COLONOSCOPY WITH PROPOFOL N/A 07/15/2015   Procedure: COLONOSCOPY WITH PROPOFOL;  Surgeon: Hulen Luster, MD;  Location: [facility] ENDOSCOPY;  Service: Gastroenterology;  Laterality: N/A;  . CYSTOSCOPY WITH BIOPSY N/A 04/16/2017   Procedure: CYSTOSCOPY WITH BLADDER NECK BIOPSY AND URETHRAL BIOPSY;  Surgeon: Hollice Espy, MD;  Location: [facility] ORS;  Service: Urology;  Laterality: N/A;  . MYOMECTOMY     x2  . UTERINE FIBROID SURGERY  1978   Family History  Problem Relation Age of Onset  . Lung cancer Father   . Heart disease Mother        congestive heart failure  . Hypertension Mother   . Lung cancer Brother   . Diabetes Brother   . Colon polyps Sister   . Breast cancer Neg Hx   . Prostate cancer Neg Hx   . Kidney cancer Neg Hx    Social History   Socioeconomic History  . Marital status: Married    Spouse name: None  . Number of children: None  . Years of education: None  . Highest education level: None  Social Needs  . Financial resource strain: None  . Food insecurity - worry: None  . Food insecurity - inability: None  . Transportation needs - medical: None  . Transportation needs - non-medical: None  Occupational History  . None  Tobacco Use  . Smoking status: Former Research scientist (life sciences)  .  Smokeless tobacco: Never Used  . Tobacco comment: quit smoking 1990  Substance and Sexual Activity  . Alcohol use: Yes    Alcohol/week: 0.0 oz    Comment: glass of wine per day  . Drug use: No  . Sexual activity: Not Currently  Other Topics Concern  . None  Social History Narrative  . None    Outpatient Encounter Medications as of 07/13/2017  Medication Sig  . Cholecalciferol (VITAMIN D) 2000 units CAPS Take 2,000 Units by mouth daily.  . fluticasone (FLONASE) 50 MCG/ACT nasal spray Place 1 spray into both nostrils as needed for allergies or rhinitis. (Patient taking differently: Place 2 sprays into both nostrils as needed for allergies or  rhinitis. )  . lisinopril (PRINIVIL,ZESTRIL) 10 MG tablet TAKE 1 TABLET BY MOUTH EVERY DAY  . rosuvastatin (CRESTOR) 10 MG tablet TAKE 1 TABLET BY MOUTH EVERY DAY  . vitamin E 400 UNIT capsule Take 400 Units by mouth daily.  . [DISCONTINUED] HYDROcodone-acetaminophen (NORCO/VICODIN) 5-325 MG tablet Take 1-2 tablets by mouth every 6 (six) hours as needed for moderate pain.   Facility-Administered Encounter Medications as of 07/13/2017  Medication  . ciprofloxacin (CIPRO) tablet 500 mg  . lidocaine (XYLOCAINE) 2 % jelly 1 application    Activities of Daily Living In your present state of health, do you have any difficulty performing the following activities: 07/13/2017 04/13/2017  Hearing? N N  Vision? N N  Difficulty concentrating or making decisions? N N  Walking or climbing stairs? N N  Dressing or bathing? N N  Doing errands, shopping? N N  Preparing Food and eating ? N -  Using the Toilet? N -  In the past six months, have you accidently leaked urine? N -  Do you have problems with loss of bowel control? N -  Managing your Medications? N -  Managing your Finances? N -  Housekeeping or managing your Housekeeping? N -  Some recent data might be hidden    Patient Care Team: Einar Pheasant, MD as PCP - General (Internal Medicine)    Assessment:    This is a routine wellness examination for Faige. The goal of the wellness visit is to assist the patient how to close the gaps in care and create a preventative care plan for the patient.   The roster of all physicians providing medical care to patient is listed in the Snapshot section of the chart.  Taking calcium VIT D as appropriate/Osteoporosis risk reviewed.    Safety issues reviewed; Smoke and carbon monoxide detectors in the home. No firearms in the home.  Wears seatbelts when driving or riding with others. Patient does wear sunscreen or protective clothing when in direct sunlight. No violence in the home.  Patient is  alert, normal appearance, oriented to person/place/and time.  Correctly identified the president of the [place], recall of 3/3 words, and performing simple calculations. Displays appropriate judgement and can read correct time from watch face.   No new identified risk were noted.  No failures at ADL's or IADL's.    BMI- discussed the importance of a healthy diet, water intake and the benefits of aerobic exercise. Educational material provided.   24 hour diet recall: Breakfast: Cereal, fruit Lunch: Veggie pizza  Dinner: Finger foods Snack: Salad Daily fluid intake: 1 cups of caffeine, 4 cups of water  Dental- every 6 months.  Dr. Mel Almond  Eye- Visual acuity not assessed per patient preference since they have regular follow up with the ophthalmologist.  Wears corrective lenses.  Sleep patterns- Sleeps 8 hours at night.  Wakes feeling rested.  Hepatitis C Screening completed.  Mammogram scheduled for next month.  Patient Concerns: None at this time. Follow up with PCP as needed.  Exercise Activities and Dietary recommendations Current Exercise Habits: Structured exercise class, Frequency (Times/Week): 3, Intensity: Moderate  Goals    . INCREASE WATER INTAKE     Stay hydrated      Fall Risk Fall Risk  07/13/2017 01/17/2017 09/13/2016 06/27/2016 05/10/2016  Falls in the past year? No No No No No   Depression Screen PHQ 2/9 Scores 07/13/2017 01/17/2017 09/13/2016 06/27/2016  PHQ - 2 Score 0 0 0 0  PHQ- 9 Score - - - -     Cognitive Function MMSE - Mini Mental State Exam 07/13/2017  Orientation to time 5  Orientation to Place 5  Registration 3  Attention/ Calculation 5  Recall 3  Language- name 2 objects 2  Language- repeat 1  Language- follow 3 step command 3  Language- read & follow direction 1  Write a sentence 1  Copy design 1  Total score 30     6CIT Screen 07/13/2016  What Year? 0 points  What month? 0 points  What time? 0 points  Count back from 20 0 points    Months in reverse 0 points  Repeat phrase 0 points  Total Score 0    Immunization History  Administered Date(s) Administered  . Influenza, High Dose Seasonal PF 05/10/2016, 06/05/2017  . Influenza,inj,Quad PF,6+ Mos 06/29/2014, 05/03/2015  . Pneumococcal Conjugate-13 07/21/2014  . Pneumococcal Polysaccharide-23 07/13/2016  . Pneumococcal-Unspecified 08/07/2010  . Zoster 02/23/2014   Screening Tests Health Maintenance  Topic Date Due  . Hepatitis C Screening  [birth year]  . MAMMOGRAM  07/24/2017  . TETANUS/TDAP  08/08/2019  . COLONOSCOPY  07/14/2020  . DEXA SCAN  Completed  . PNA vac Low Risk Adult  Completed  . INFLUENZA VACCINE  Discontinued      Plan:   End of life planning; Advanced aging; Advanced directives discussed.  No HCPOA/Living Will.  Additional information provided to help them start the conversation with family.  Copy of HCPOA/Living Will requested upon completion. Time spent on this topic is 17 minutes.  I have personally reviewed and noted the following in the patient's chart:   . Medical and social history . Use of alcohol, tobacco or illicit drugs  . Current medications and supplements . Functional ability and status . Nutritional status . Physical activity . Advanced directives . List of other physicians . Hospitalizations, surgeries, and ER visits in previous 12 months . Vitals . Screenings to include cognitive, depression, and falls . Referrals and appointments  In addition, I have reviewed and discussed with patient certain preventive protocols, quality metrics, and best practice recommendations. A written personalized care plan for preventive services as well as general preventive health recommendations were provided to patient.     Varney Biles, LPN  05/15/3234   Reviewed above information.  Agree with assessment and plan.    Dr Nicki Reaper

## 2017-07-14 LAB — HEPATITIS C ANTIBODY
Hepatitis C Ab: NONREACTIVE
SIGNAL TO CUT-OFF: 0.03 (ref <1.00)

## 2017-07-20 DIAGNOSIS — H2513 Age-related nuclear cataract, bilateral: Secondary | ICD-10-CM | POA: Diagnosis not present

## 2017-07-20 DIAGNOSIS — H40053 Ocular hypertension, bilateral: Secondary | ICD-10-CM | POA: Diagnosis not present

## 2017-08-09 ENCOUNTER — Ambulatory Visit (INDEPENDENT_AMBULATORY_CARE_PROVIDER_SITE_OTHER): Payer: Medicare Other | Admitting: Internal Medicine

## 2017-08-09 ENCOUNTER — Encounter: Payer: Self-pay | Admitting: Internal Medicine

## 2017-08-09 DIAGNOSIS — R319 Hematuria, unspecified: Secondary | ICD-10-CM

## 2017-08-09 DIAGNOSIS — Z6832 Body mass index (BMI) 32.0-32.9, adult: Secondary | ICD-10-CM

## 2017-08-09 DIAGNOSIS — I10 Essential (primary) hypertension: Secondary | ICD-10-CM | POA: Diagnosis not present

## 2017-08-09 DIAGNOSIS — E7800 Pure hypercholesterolemia, unspecified: Secondary | ICD-10-CM

## 2017-08-09 NOTE — Progress Notes (Signed)
Patient ID: Janice Floyd, female   DOB: 04-20-1951, 67 y.o.   MRN: [id number]   Subjective:    Patient ID: Janice Floyd, female    DOB: March 20, 1951, 67 y.o.   MRN: [id number]  HPI  Patient here for a scheduled follow up.  She reports she is doing well.  Feels good.  Stays active.  No chest pain.  No sob.  No acid reflux.  No abdominal pain.  Bowels moving.  Blood pressures doing well.  Overall feels good.    Past Medical History:  Diagnosis Date  . Anemia    iron deficient  . Depression   . Hyperlipemia   . Hypertension   . Vitamin D deficiency    Past Surgical History:  Procedure Laterality Date  . APPENDECTOMY    . COLONOSCOPY WITH PROPOFOL N/A 07/15/2015   Procedure: COLONOSCOPY WITH PROPOFOL;  Surgeon: Hulen Luster, MD;  Location: [facility] ENDOSCOPY;  Service: Gastroenterology;  Laterality: N/A;  . CYSTOSCOPY WITH BIOPSY N/A 04/16/2017   Procedure: CYSTOSCOPY WITH BLADDER NECK BIOPSY AND URETHRAL BIOPSY;  Surgeon: Hollice Espy, MD;  Location: [facility] ORS;  Service: Urology;  Laterality: N/A;  . MYOMECTOMY     x2  . UTERINE FIBROID SURGERY  1978   Family History  Problem Relation Age of Onset  . Lung cancer Father   . Heart disease Mother        congestive heart failure  . Hypertension Mother   . Lung cancer Brother   . Diabetes Brother   . Colon polyps Sister   . Breast cancer Neg Hx   . Prostate cancer Neg Hx   . Kidney cancer Neg Hx    Social History   Socioeconomic History  . Marital status: Married    Spouse name: None  . Number of children: None  . Years of education: None  . Highest education level: None  Social Needs  . Financial resource strain: None  . Food insecurity - worry: None  . Food insecurity - inability: None  . Transportation needs - medical: None  . Transportation needs - non-medical: None  Occupational History  . None  Tobacco Use  . Smoking status: Former Research scientist (life sciences)  . Smokeless tobacco: Never Used  . Tobacco comment: quit smoking 1990    Substance and Sexual Activity  . Alcohol use: Yes    Alcohol/week: 0.0 oz    Comment: glass of wine per day  . Drug use: No  . Sexual activity: Not Currently  Other Topics Concern  . None  Social History Narrative  . None    Outpatient Encounter Medications as of 08/09/2017  Medication Sig  . Cholecalciferol (VITAMIN D) 2000 units CAPS Take 2,000 Units by mouth daily.  . fluticasone (FLONASE) 50 MCG/ACT nasal spray Place 1 spray into both nostrils as needed for allergies or rhinitis. (Patient taking differently: Place 2 sprays into both nostrils as needed for allergies or rhinitis. )  . lisinopril (PRINIVIL,ZESTRIL) 10 MG tablet TAKE 1 TABLET BY MOUTH EVERY DAY  . rosuvastatin (CRESTOR) 10 MG tablet TAKE 1 TABLET BY MOUTH EVERY DAY  . vitamin E 400 UNIT capsule Take 400 Units by mouth daily.  . [DISCONTINUED] ciprofloxacin (CIPRO) tablet 500 mg   . [DISCONTINUED] lidocaine (XYLOCAINE) 2 % jelly 1 application    No facility-administered encounter medications on file as of 08/09/2017.     Review of Systems  Constitutional: Negative for appetite change and unexpected weight change.  HENT: Negative for congestion and  sinus pressure.   Respiratory: Negative for cough, chest tightness and shortness of breath.   Cardiovascular: Negative for chest pain, palpitations and leg swelling.  Gastrointestinal: Negative for abdominal pain, diarrhea, nausea and vomiting.  Genitourinary: Negative for difficulty urinating and dysuria.  Musculoskeletal: Negative for back pain and joint swelling.  Skin: Negative for color change and rash.  Neurological: Negative for dizziness, light-headedness and headaches.  Psychiatric/Behavioral: Negative for agitation and dysphoric mood.       Objective:     Blood pressure rechecked by me:  124/82  Physical Exam  Constitutional: She appears well-developed and well-nourished. No distress.  HENT:  Nose: Nose normal.  Mouth/Throat: Oropharynx is clear and  moist.  Neck: Neck supple. No thyromegaly present.  Cardiovascular: Normal rate and regular rhythm.  Pulmonary/Chest: Breath sounds normal. No respiratory distress. She has no wheezes.  Abdominal: Soft. Bowel sounds are normal. There is no tenderness.  Musculoskeletal: She exhibits no edema or tenderness.  Lymphadenopathy:    She has no cervical adenopathy.  Skin: No rash noted. No erythema.  Psychiatric: She has a normal mood and affect. Her behavior is normal.    BP 120/76   Pulse 69   Temp 97.6 F (36.4 C) (Oral)   Ht 5\' 5"  (1.651 m)   Wt 193 lb 12.8 oz (87.9 kg)   LMP 10/15/1999   SpO2 98%   BMI 32.25 kg/m  Wt Readings from Last 3 Encounters:  08/09/17 193 lb 12.8 oz (87.9 kg)  07/13/17 193 lb (87.5 kg)  05/24/17 193 lb 9.6 oz (87.8 kg)     Lab Results  Component Value Date   WBC 6.3 04/13/2017   HGB 13.2 04/13/2017   HCT 40.0 04/13/2017   PLT 204 04/13/2017   GLUCOSE 88 05/24/2017   CHOL 137 05/24/2017   TRIG 43.0 05/24/2017   HDL 68.00 05/24/2017   LDLCALC 60 05/24/2017   ALT 8 05/24/2017   AST 14 05/24/2017   NA 140 05/24/2017   K 4.6 05/24/2017   CL 103 05/24/2017   CREATININE 1.07 05/24/2017   BUN 15 05/24/2017   CO2 31 05/24/2017   TSH 1.18 09/13/2016       Assessment & Plan:   Problem List Items Addressed This Visit    BMI 32.0-32.9,adult    Discussed diet and exercise.  Follow.       Essential hypertension, benign    Blood pressure under good control.  Continue same medication regimen.  Follow pressures.  Follow metabolic panel.        Relevant Orders   CBC with Differential/Platelet   TSH   Basic metabolic panel   Hematuria    Worked up by urology.  S/p cystoscopy and biopsy.  Per report, checked out ok.  Recommended f/u as needed.        Hypercholesterolemia    On crestor.  Low cholesterol diet and exercise.  Follow lipid panel and liver function tests.        Relevant Orders   Lipid panel   Hepatic function panel        Einar Pheasant, MD

## 2017-08-09 NOTE — Progress Notes (Signed)
Pre visit review using our clinic review tool, if applicable. No additional management support is needed unless otherwise documented below in the visit note. 

## 2017-08-12 ENCOUNTER — Encounter: Payer: Self-pay | Admitting: Internal Medicine

## 2017-08-12 NOTE — Assessment & Plan Note (Signed)
Blood pressure under good control.  Continue same medication regimen.  Follow pressures.  Follow metabolic panel.   

## 2017-08-12 NOTE — Assessment & Plan Note (Signed)
On crestor.  Low cholesterol diet and exercise.  Follow lipid panel and liver function tests.   

## 2017-08-12 NOTE — Assessment & Plan Note (Signed)
Discussed diet and exercise.  Follow.  

## 2017-08-12 NOTE — Assessment & Plan Note (Signed)
Worked up by urology.  S/p cystoscopy and biopsy.  Per report, checked out ok.  Recommended f/u as needed.

## 2017-08-14 ENCOUNTER — Ambulatory Visit
Admission: RE | Admit: 2017-08-14 | Discharge: 2017-08-14 | Disposition: A | Discharge status: Home / Self Care | Payer: Medicare Other | Source: Ambulatory Visit | Attending: Internal Medicine | Admitting: Internal Medicine

## 2017-08-14 DIAGNOSIS — Z1239 Encounter for other screening for malignant neoplasm of breast: Secondary | ICD-10-CM

## 2017-08-14 DIAGNOSIS — Z1231 Encounter for screening mammogram for malignant neoplasm of breast: Secondary | ICD-10-CM | POA: Diagnosis not present

## 2017-09-27 ENCOUNTER — Other Ambulatory Visit: Payer: Self-pay

## 2017-09-27 MED ORDER — LISINOPRIL 10 MG PO TABS: 10.0000 mg | ORAL_TABLET | Freq: Every day | ORAL | 4 refills | Status: DC

## 2017-10-09 ENCOUNTER — Other Ambulatory Visit: Payer: Self-pay | Admitting: Internal Medicine

## 2017-10-09 MED ORDER — LISINOPRIL 10 MG PO TABS: 10.0000 mg | ORAL_TABLET | Freq: Every day | ORAL | 5 refills | Status: DC

## 2017-12-11 ENCOUNTER — Other Ambulatory Visit (INDEPENDENT_AMBULATORY_CARE_PROVIDER_SITE_OTHER): Payer: Medicare Other

## 2017-12-11 DIAGNOSIS — I10 Essential (primary) hypertension: Secondary | ICD-10-CM | POA: Diagnosis not present

## 2017-12-11 DIAGNOSIS — E7800 Pure hypercholesterolemia, unspecified: Secondary | ICD-10-CM

## 2017-12-11 LAB — CBC WITH DIFFERENTIAL/PLATELET
BASOS ABS: 0 10*3/uL (ref 0.0–0.1)
BASOS PCT: 0.7 % (ref 0.0–3.0)
EOS ABS: 0.3 10*3/uL (ref 0.0–0.7)
Eosinophils Relative: 6 % — ABNORMAL HIGH (ref 0.0–5.0)
HCT: 39.6 % (ref 36.0–46.0)
HEMOGLOBIN: 13 g/dL (ref 12.0–15.0)
LYMPHS PCT: 27.3 % (ref 12.0–46.0)
Lymphs Abs: 1.5 10*3/uL (ref 0.7–4.0)
MCHC: 32.7 g/dL (ref 30.0–36.0)
MCV: 86.6 fl (ref 78.0–100.0)
MONO ABS: 0.6 10*3/uL (ref 0.1–1.0)
Monocytes Relative: 11 % (ref 3.0–12.0)
Neutro Abs: 3.1 10*3/uL (ref 1.4–7.7)
Neutrophils Relative %: 55 % (ref 43.0–77.0)
PLATELETS: 232 10*3/uL (ref 150.0–400.0)
RBC: 4.58 Mil/uL (ref 3.87–5.11)
RDW: 15 % (ref 11.5–15.5)
WBC: 5.6 10*3/uL (ref 4.0–10.5)

## 2017-12-11 LAB — HEPATIC FUNCTION PANEL
ALK PHOS: 45 U/L (ref 39–117)
ALT: 10 U/L (ref 0–35)
AST: 16 U/L (ref 0–37)
Albumin: 4 g/dL (ref 3.5–5.2)
BILIRUBIN DIRECT: 0.1 mg/dL (ref 0.0–0.3)
BILIRUBIN TOTAL: 0.7 mg/dL (ref 0.2–1.2)
TOTAL PROTEIN: 7.3 g/dL (ref 6.0–8.3)

## 2017-12-11 LAB — LIPID PANEL
CHOL/HDL RATIO: 2
Cholesterol: 142 mg/dL (ref 0–200)
HDL: 72.1 mg/dL (ref >39.00)
LDL CALC: 63 mg/dL (ref 0–99)
NONHDL: 69.59
TRIGLYCERIDES: 34 mg/dL (ref 0.0–149.0)
VLDL: 6.8 mg/dL (ref 0.0–40.0)

## 2017-12-11 LAB — BASIC METABOLIC PANEL
BUN: 14 mg/dL (ref 6–23)
CHLORIDE: 103 meq/L (ref 96–112)
CO2: 30 meq/L (ref 19–32)
CREATININE: 1.04 mg/dL (ref 0.40–1.20)
Calcium: 9.6 mg/dL (ref 8.4–10.5)
GFR: 67.55 mL/min (ref >60.00)
Glucose, Bld: 88 mg/dL (ref 70–99)
Potassium: 4.6 mEq/L (ref 3.5–5.1)
Sodium: 139 mEq/L (ref 135–145)

## 2017-12-11 LAB — TSH: TSH: 2.17 u[IU]/mL (ref 0.35–4.50)

## 2017-12-13 ENCOUNTER — Encounter: Payer: Self-pay | Admitting: Internal Medicine

## 2017-12-13 ENCOUNTER — Ambulatory Visit (INDEPENDENT_AMBULATORY_CARE_PROVIDER_SITE_OTHER): Payer: Medicare Other | Admitting: Internal Medicine

## 2017-12-13 DIAGNOSIS — D649 Anemia, unspecified: Secondary | ICD-10-CM | POA: Diagnosis not present

## 2017-12-13 DIAGNOSIS — Z6833 Body mass index (BMI) 33.0-33.9, adult: Secondary | ICD-10-CM

## 2017-12-13 DIAGNOSIS — E559 Vitamin D deficiency, unspecified: Secondary | ICD-10-CM

## 2017-12-13 DIAGNOSIS — E7800 Pure hypercholesterolemia, unspecified: Secondary | ICD-10-CM

## 2017-12-13 DIAGNOSIS — I10 Essential (primary) hypertension: Secondary | ICD-10-CM | POA: Diagnosis not present

## 2017-12-13 NOTE — Progress Notes (Signed)
Patient ID: Janice Floyd, female   DOB: 1950-09-17, 67 y.o.   MRN: [id number]   Subjective:    Patient ID: Janice Floyd, female    DOB: [birth year], 67 y.o.   MRN: [id number]  HPI  Patient here for a scheduled follow up.  She reports she is doing well.  Feels good.  On lisinopril.  Doing well on the medication.  Blood pressures averaging 130/80.  No chest pain.  No sob.  No acid reflux.  No abdominal pain.  Bowels moving.  No urine change.     Past Medical History:  Diagnosis Date  . Anemia    iron deficient  . Depression   . Hyperlipemia   . Hypertension   . Vitamin D deficiency    Past Surgical History:  Procedure Laterality Date  . APPENDECTOMY    . COLONOSCOPY WITH PROPOFOL N/A 07/15/2015   Procedure: COLONOSCOPY WITH PROPOFOL;  Surgeon: Hulen Luster, MD;  Location[facility] ENDOSCOPY;  Service: Gastroenterology;  Laterality: N/A;  . CYSTOSCOPY WITH BIOPSY N/A 04/16/2017   Procedure: CYSTOSCOPY WITH BLADDER NECK BIOPSY AND URETHRAL BIOPSY;  Surgeon: Hollice Espy, MD;  Location: [facility] ORS;  Service: Urology;  Laterality: N/A;  . MYOMECTOMY     x2  . UTERINE FIBROID SURGERY  1978   Family History  Problem Relation Age of Onset  . Lung cancer Father   . Heart disease Mother        congestive heart failure  . Hypertension Mother   . Lung cancer Brother   . Diabetes Brother   . Colon polyps Sister   . Breast cancer Neg Hx   . Prostate cancer Neg Hx   . Kidney cancer Neg Hx    Social History   Socioeconomic History  . Marital status: Married    Spouse name: Not on file  . Number of children: Not on file  . Years of education: Not on file  . Highest education level: Not on file  Occupational History  . Not on file  Social Needs  . Financial resource strain: Not on file  . Food insecurity:    Worry: Not on file    Inability: Not on file  . Transportation needs:    Medical: Not on file    Non-medical: Not on file  Tobacco Use  . Smoking status: Former Research scientist (life sciences)  .  Smokeless tobacco: Never Used  . Tobacco comment: quit smoking 1990  Substance and Sexual Activity  . Alcohol use: Yes    Alcohol/week: 0.0 oz    Comment: glass of wine per day  . Drug use: No  . Sexual activity: Not Currently  Lifestyle  . Physical activity:    Days per week: Not on file    Minutes per session: Not on file  . Stress: Not on file  Relationships  . Social connections:    Talks on phone: Not on file    Gets together: Not on file    Attends religious service: Not on file    Active member of club or organization: Not on file    Attends meetings of clubs or organizations: Not on file    Relationship status: Not on file  Other Topics Concern  . Not on file  Social History Narrative  . Not on file    Outpatient Encounter Medications as of 12/13/2017  Medication Sig  . Cholecalciferol (VITAMIN D) 2000 units CAPS Take 2,000 Units by mouth daily.  . fluticasone (FLONASE) 50 MCG/ACT nasal  spray Place 1 spray into both nostrils as needed for allergies or rhinitis. (Patient taking differently: Place 2 sprays into both nostrils as needed for allergies or rhinitis. )  . lisinopril (PRINIVIL,ZESTRIL) 10 MG tablet Take 1 tablet (10 mg total) by mouth daily.  . rosuvastatin (CRESTOR) 10 MG tablet TAKE 1 TABLET BY MOUTH EVERY DAY  . vitamin E 400 UNIT capsule Take 400 Units by mouth daily.   No facility-administered encounter medications on file as of 12/13/2017.     Review of Systems  Constitutional: Negative for appetite change and unexpected weight change.  HENT: Negative for congestion and sinus pressure.   Respiratory: Negative for cough, chest tightness and shortness of breath.   Cardiovascular: Negative for chest pain, palpitations and leg swelling.  Gastrointestinal: Negative for abdominal pain, diarrhea, nausea and vomiting.  Genitourinary: Negative for difficulty urinating and dysuria.  Musculoskeletal: Negative for joint swelling and myalgias.  Skin: Negative for  color change and rash.  Neurological: Negative for dizziness, light-headedness and headaches.  Psychiatric/Behavioral: Negative for agitation and dysphoric mood.       Objective:     Blood pressure rechecked by me:  120/82  Physical Exam  Constitutional: She appears well-developed and well-nourished. No distress.  HENT:  Nose: Nose normal.  Mouth/Throat: Oropharynx is clear and moist.  Neck: Neck supple. No thyromegaly present.  Cardiovascular: Normal rate and regular rhythm.  Pulmonary/Chest: Breath sounds normal. No respiratory distress. She has no wheezes.  Abdominal: Soft. Bowel sounds are normal. There is no tenderness.  Musculoskeletal: She exhibits no edema or tenderness.  Lymphadenopathy:    She has no cervical adenopathy.  Skin: No rash noted. No erythema.  Psychiatric: She has a normal mood and affect. Her behavior is normal.    BP 120/82   Pulse 96   Temp 98.2 F (36.8 C) (Oral)   Resp 16   Wt 199 lb 12.8 oz (90.6 kg)   LMP 10/15/1999   SpO2 98%   BMI 33.25 kg/m  Wt Readings from Last 3 Encounters:  12/13/17 199 lb 12.8 oz (90.6 kg)  08/09/17 193 lb 12.8 oz (87.9 kg)  07/13/17 193 lb (87.5 kg)     Lab Results  Component Value Date   WBC 5.6 12/11/2017   HGB 13.0 12/11/2017   HCT 39.6 12/11/2017   PLT 232.0 12/11/2017   GLUCOSE 88 12/11/2017   CHOL 142 12/11/2017   TRIG 34.0 12/11/2017   HDL 72.10 12/11/2017   LDLCALC 63 12/11/2017   ALT 10 12/11/2017   AST 16 12/11/2017   NA 139 12/11/2017   K 4.6 12/11/2017   CL 103 12/11/2017   CREATININE 1.04 12/11/2017   BUN 14 12/11/2017   CO2 30 12/11/2017   TSH 2.17 12/11/2017    Mm Screening Breast Tomo Bilateral  Result Date: 08/14/2017 CLINICAL DATA:  Screening. EXAM: 2D DIGITAL SCREENING BILATERAL MAMMOGRAM WITH 3D TOMO WITH CAD COMPARISON:  Previous exam(s). ACR Breast Density Category b: There are scattered areas of fibroglandular density. FINDINGS: There are no findings suspicious for  malignancy. Images were processed with CAD. IMPRESSION: No mammographic evidence of malignancy. A result letter of this screening mammogram will be mailed directly to the patient. RECOMMENDATION: Screening mammogram in one year. (Code:SM-B-01Y) BI-RADS CATEGORY  1: Negative. Electronically Signed   By: Everlean Alstrom M.D.   On: 08/14/2017 13:40       Assessment & Plan:   Problem List Items Addressed This Visit    Anemia    Follow  cbc.       BMI 33.0-33.9,adult    Diet and exercise.  Follow.       Essential hypertension, benign    Blood pressure under good control.  Continue same medication regimen.  Follow pressures.  Follow metabolic panel.        Relevant Orders   Basic metabolic panel   Hypercholesterolemia    On crestor.  Low cholesterol diet and exercise.  Follow lipid panel and liver function tests.        Relevant Orders   Hepatic function panel   Lipid panel   Vitamin D deficiency    Follow vitamin D level.            Einar Pheasant, MD

## 2017-12-15 ENCOUNTER — Encounter: Payer: Self-pay | Admitting: Internal Medicine

## 2017-12-15 NOTE — Assessment & Plan Note (Signed)
Diet and exercise.  Follow.  

## 2017-12-15 NOTE — Assessment & Plan Note (Signed)
Follow cbc.  

## 2017-12-15 NOTE — Assessment & Plan Note (Signed)
On crestor.  Low cholesterol diet and exercise.  Follow lipid panel and liver function tests.   

## 2017-12-15 NOTE — Assessment & Plan Note (Signed)
Follow vitamin D level.  

## 2017-12-15 NOTE — Assessment & Plan Note (Signed)
Blood pressure under good control.  Continue same medication regimen.  Follow pressures.  Follow metabolic panel.   

## 2018-04-03 ENCOUNTER — Other Ambulatory Visit: Payer: Self-pay | Admitting: Internal Medicine

## 2018-05-22 ENCOUNTER — Other Ambulatory Visit: Payer: Medicare Other

## 2018-05-23 ENCOUNTER — Encounter: Payer: Medicare Other | Admitting: Internal Medicine

## 2018-07-09 ENCOUNTER — Other Ambulatory Visit: Payer: Self-pay | Admitting: Internal Medicine

## 2018-07-16 ENCOUNTER — Ambulatory Visit (INDEPENDENT_AMBULATORY_CARE_PROVIDER_SITE_OTHER): Payer: Medicare Other

## 2018-07-16 VITALS — BP 118/72 | HR 72 | Temp 97.9°F | Resp 15 | Wt 200.0 lb

## 2018-07-16 DIAGNOSIS — Z0000 Encounter for general adult medical examination without abnormal findings: Secondary | ICD-10-CM | POA: Diagnosis not present

## 2018-07-16 NOTE — Progress Notes (Signed)
Subjective:   Janice Floyd is a 67 y.o. female who presents for Medicare Annual (Subsequent) preventive examination.  Review of Systems:  No ROS.  Medicare Wellness Visit. Additional risk factors are reflected in the social history. Cardiac Risk Factors include: advanced age (>9men, >24 women);hypertension     Objective:     Vitals: BP 118/72 (BP Location: Left Arm, Patient Position: Sitting, Cuff Size: Normal)   Pulse 72   Temp 97.9 F (36.6 C) (Oral)   Resp 15   Wt 200 lb (90.7 kg)   LMP 10/15/1999   SpO2 98%   BMI 33.28 kg/m   Body mass index is 33.28 kg/m.  Advanced Directives 07/16/2018 07/13/2017 04/16/2017 04/13/2017 07/13/2016  Does Patient Have a Medical Advance Directive? No No No No No  Would patient like information on creating a medical advance directive? No - Patient declined Yes (MAU/Ambulatory/Procedural Areas - Information given) No - Patient declined No - Patient declined Yes (MAU/Ambulatory/Procedural Areas - Information given)    Tobacco Social History   Tobacco Use  Smoking Status Former Smoker  Smokeless Tobacco Never Used  Tobacco Comment   quit smoking 1990     Counseling given: Not Answered Comment: quit smoking 1990   Clinical Intake:  Pre-visit preparation completed: Yes  Pain : No/denies pain     Nutritional Status: BMI > 30  Obese Diabetes: No  How often do you need to have someone help you when you read instructions, pamphlets, or other written materials from your doctor or pharmacy?: 1 - Never  Interpreter Needed?: No     Past Medical History:  Diagnosis Date  . Anemia    iron deficient  . Depression   . Hyperlipemia   . Hypertension   . Vitamin D deficiency    Past Surgical History:  Procedure Laterality Date  . APPENDECTOMY    . COLONOSCOPY WITH PROPOFOL N/A 07/15/2015   Procedure: COLONOSCOPY WITH PROPOFOL;  Surgeon: Hulen Luster, MD;  Location: [facility] ENDOSCOPY;  Service: Gastroenterology;  Laterality: N/A;  .  CYSTOSCOPY WITH BIOPSY N/A 04/16/2017   Procedure: CYSTOSCOPY WITH BLADDER NECK BIOPSY AND URETHRAL BIOPSY;  Surgeon: Hollice Espy, MD;  Location: [facility] ORS;  Service: Urology;  Laterality: N/A;  . MYOMECTOMY     x2  . UTERINE FIBROID SURGERY  1978   Family History  Problem Relation Age of Onset  . Lung cancer Father   . Heart disease Mother        congestive heart failure  . Hypertension Mother   . Lung cancer Brother   . Diabetes Brother   . Colon polyps Sister   . Breast cancer Neg Hx   . Prostate cancer Neg Hx   . Kidney cancer Neg Hx    Social History   Socioeconomic History  . Marital status: Married    Spouse name: Not on file  . Number of children: Not on file  . Years of education: Not on file  . Highest education level: Not on file  Occupational History  . Not on file  Social Needs  . Financial resource strain: Not hard at all  . Food insecurity:    Worry: Never true    Inability: Never true  . Transportation needs:    Medical: No    Non-medical: No  Tobacco Use  . Smoking status: Former Research scientist (life sciences)  . Smokeless tobacco: Never Used  . Tobacco comment: quit smoking 1990  Substance and Sexual Activity  . Alcohol use: Yes  Alcohol/week: 0.0 standard drinks    Comment: glass of wine per day  . Drug use: No  . Sexual activity: Not Currently  Lifestyle  . Physical activity:    Days per week: 2 days    Minutes per session: 60 min  . Stress: Not at all  Relationships  . Social connections:    Talks on phone: Not on file    Gets together: Not on file    Attends religious service: Not on file    Active member of club or organization: Not on file    Attends meetings of clubs or organizations: Not on file    Relationship status: Married  Other Topics Concern  . Not on file  Social History Narrative  . Not on file    Outpatient Encounter Medications as of 07/16/2018  Medication Sig  . Cholecalciferol (VITAMIN D) 2000 units CAPS Take 2,000 Units by mouth  daily.  . fluticasone (FLONASE) 50 MCG/ACT nasal spray Place 1 spray into both nostrils as needed for allergies or rhinitis. (Patient taking differently: Place 2 sprays into both nostrils as needed for allergies or rhinitis. )  . lisinopril (PRINIVIL,ZESTRIL) 10 MG tablet TAKE 1 TABLET BY MOUTH EVERY DAY  . rosuvastatin (CRESTOR) 10 MG tablet TAKE 1 TABLET BY MOUTH EVERY DAY  . vitamin E 400 UNIT capsule Take 400 Units by mouth daily.   No facility-administered encounter medications on file as of 07/16/2018.     Activities of Daily Living In your present state of health, do you have any difficulty performing the following activities: 07/16/2018  Hearing? N  Vision? N  Difficulty concentrating or making decisions? N  Walking or climbing stairs? N  Dressing or bathing? N  Doing errands, shopping? N  Preparing Food and eating ? N  Using the Toilet? N  In the past six months, have you accidently leaked urine? N  Do you have problems with loss of bowel control? N  Managing your Medications? N  Managing your Finances? N  Housekeeping or managing your Housekeeping? N  Some recent data might be hidden    Patient Care Team: Einar Pheasant, MD as PCP - General (Internal Medicine)    Assessment:   This is a routine wellness examination for Janice Floyd.  The goal of the wellness visit is to assist the patient how to close the gaps in care and create a preventative care plan for the patient.   The roster of all physicians providing medical care to patient is listed in the Snapshot section of the chart.  Osteopenia. Taking calcium VIT D as appropriate/Osteoporosis risk reviewed.    Safety issues reviewed; Smoke and carbon monoxide detectors in the home. No firearms in the home. Wears seatbelts when driving or riding with others. No violence in the home.  They do not have excessive sun exposure.  Discussed the need for sun protection: hats, long sleeves and the use of sunscreen if there is  significant sun exposure.  Patient is alert, normal appearance, oriented to person/place/and time.  Correctly identified the president of the [place] and recalls of 3/3 words. Performs simple calculations and can read correct time from watch face.  Displays appropriate judgement.  No new identified risk were noted.  No failures at ADL's or IADL's.  BMI- discussed the importance of a healthy diet, water intake and the benefits of aerobic exercise. Educational material provided.   24 hour diet recall: Regular  Dental- every 6 months.  Sleep patterns- Sleeps about 7 hours  at night.    Health maintenance gaps- closed.  Patient Concerns: None at this time. Follow up with PCP as needed.  Exercise Activities and Dietary recommendations Current Exercise Habits: Home exercise routine, Type of exercise: walking;yoga, Time (Minutes): 60, Frequency (Times/Week): 2, Weekly Exercise (Minutes/Week): 120, Intensity: Moderate  Goals      Patient Stated   . Lose weight (pt-stated)     Cardio/strength training exercise   Healthy diet, portion controlled       Fall Risk Fall Risk  07/16/2018 08/09/2017 07/13/2017 01/17/2017 09/13/2016  Falls in the past year? 0 No No No No   Depression Screen PHQ 2/9 Scores 07/16/2018 08/09/2017 07/13/2017 01/17/2017  PHQ - 2 Score 0 0 0 0  PHQ- 9 Score - - - -     Cognitive Function MMSE - Mini Mental State Exam 07/16/2018 07/13/2017  Orientation to time 5 5  Orientation to Place 5 5  Registration 3 3  Attention/ Calculation 5 5  Recall 3 3  Language- name 2 objects 2 2  Language- repeat 1 1  Language- follow 3 step command 3 3  Language- read & follow direction 1 1  Write a sentence 1 1  Copy design 1 1  Total score 30 30     6CIT Screen 07/13/2016  What Year? 0 points  What month? 0 points  What time? 0 points  Count back from 20 0 points  Months in reverse 0 points  Repeat phrase 0 points  Total Score 0    Immunization History  Administered  Date(s) Administered  . Influenza, High Dose Seasonal PF 05/10/2016, 06/05/2017, 05/30/2018  . Influenza,inj,Quad PF,6+ Mos 06/29/2014, 05/03/2015  . Pneumococcal Conjugate-13 07/21/2014  . Pneumococcal Polysaccharide-23 07/13/2016  . Pneumococcal-Unspecified 08/07/2010  . Zoster 02/23/2014   Screening Tests Health Maintenance  Topic Date Due  . MAMMOGRAM  08/14/2018  . TETANUS/TDAP  08/08/2019  . COLONOSCOPY  07/14/2020  . DEXA SCAN  Completed  . Hepatitis C Screening  Completed  . PNA vac Low Risk Adult  Completed  . INFLUENZA VACCINE  Discontinued      Plan:    End of life planning; Advance aging; Advanced directives discussed. Copy of current HCPOA/Living Will requested upon completion.    I have personally reviewed and noted the following in the patient's chart:   . Medical and social history . Use of alcohol, tobacco or illicit drugs  . Current medications and supplements . Functional ability and status . Nutritional status . Physical activity . Advanced directives . List of other physicians . Hospitalizations, surgeries, and ER visits in previous 12 months . Vitals . Screenings to include cognitive, depression, and falls . Referrals and appointments  In addition, I have reviewed and discussed with patient certain preventive protocols, quality metrics, and best practice recommendations. A written personalized care plan for preventive services as well as general preventive health recommendations were provided to patient.     Varney Biles, LPN  03/[id number]   Reviewed above information.  Agree with assessment and plan.    Dr Nicki Reaper

## 2018-07-16 NOTE — Patient Instructions (Addendum)
  Janice Floyd , Thank you for taking time to come for your Medicare Wellness Visit. I appreciate your ongoing commitment to your health goals. Please review the following plan we discussed and let me know if I can assist you in the future.   Follow up as needed.    Bring a copy of your [place] and/or Living Will to be scanned into chart.  Have a great day!  These are the goals we discussed: Goals      Patient Stated   . Lose weight (pt-stated)     Cardio/strength training exercise   Healthy diet, portion controlled       This is a list of the screening recommended for you and due dates:  Health Maintenance  Topic Date Due  . Mammogram  08/14/2018  . Tetanus Vaccine  08/08/2019  . Colon Cancer Screening  07/14/2020  . DEXA scan (bone density measurement)  Completed  .  Hepatitis C: One time screening is recommended by Center for Disease Control  (CDC) for  adults born from [birth year] through 1967.   Completed  . Pneumonia vaccines  Completed  . Flu Shot  Discontinued

## 2018-07-26 ENCOUNTER — Other Ambulatory Visit: Payer: Self-pay | Admitting: Internal Medicine

## 2018-07-26 DIAGNOSIS — Z1231 Encounter for screening mammogram for malignant neoplasm of breast: Secondary | ICD-10-CM

## 2018-08-21 ENCOUNTER — Ambulatory Visit
Admission: RE | Admit: 2018-08-21 | Discharge: 2018-08-21 | Disposition: A | Discharge status: Home / Self Care | Payer: Medicare Other | Source: Ambulatory Visit | Attending: Internal Medicine | Admitting: Internal Medicine

## 2018-08-21 DIAGNOSIS — Z1231 Encounter for screening mammogram for malignant neoplasm of breast: Secondary | ICD-10-CM | POA: Insufficient documentation

## 2018-08-27 DIAGNOSIS — M2242 Chondromalacia patellae, left knee: Secondary | ICD-10-CM | POA: Diagnosis not present

## 2018-09-03 DIAGNOSIS — H40053 Ocular hypertension, bilateral: Secondary | ICD-10-CM | POA: Diagnosis not present

## 2018-09-11 DIAGNOSIS — M25562 Pain in left knee: Secondary | ICD-10-CM | POA: Diagnosis not present

## 2018-09-11 DIAGNOSIS — M222X2 Patellofemoral disorders, left knee: Secondary | ICD-10-CM | POA: Diagnosis not present

## 2018-09-13 DIAGNOSIS — M25562 Pain in left knee: Secondary | ICD-10-CM | POA: Diagnosis not present

## 2018-09-13 DIAGNOSIS — M222X2 Patellofemoral disorders, left knee: Secondary | ICD-10-CM | POA: Diagnosis not present

## 2018-09-16 ENCOUNTER — Other Ambulatory Visit (INDEPENDENT_AMBULATORY_CARE_PROVIDER_SITE_OTHER): Payer: Medicare Other

## 2018-09-16 DIAGNOSIS — I10 Essential (primary) hypertension: Secondary | ICD-10-CM | POA: Diagnosis not present

## 2018-09-16 DIAGNOSIS — E7800 Pure hypercholesterolemia, unspecified: Secondary | ICD-10-CM

## 2018-09-16 LAB — HEPATIC FUNCTION PANEL
ALK PHOS: 52 U/L (ref 39–117)
ALT: 10 U/L (ref 0–35)
AST: 15 U/L (ref 0–37)
Albumin: 4.1 g/dL (ref 3.5–5.2)
Bilirubin, Direct: 0.1 mg/dL (ref 0.0–0.3)
Total Bilirubin: 0.7 mg/dL (ref 0.2–1.2)
Total Protein: 7.4 g/dL (ref 6.0–8.3)

## 2018-09-16 LAB — LIPID PANEL
CHOL/HDL RATIO: 2
Cholesterol: 142 mg/dL (ref 0–200)
HDL: 57.9 mg/dL (ref >39.00)
LDL Cholesterol: 77 mg/dL (ref 0–99)
NONHDL: 84.38
Triglycerides: 39 mg/dL (ref 0.0–149.0)
VLDL: 7.8 mg/dL (ref 0.0–40.0)

## 2018-09-16 LAB — BASIC METABOLIC PANEL
BUN: 16 mg/dL (ref 6–23)
CO2: 28 meq/L (ref 19–32)
Calcium: 9.5 mg/dL (ref 8.4–10.5)
Chloride: 103 mEq/L (ref 96–112)
Creatinine, Ser: 0.97 mg/dL (ref 0.40–1.20)
GFR: 68.72 mL/min (ref >60.00)
Glucose, Bld: 78 mg/dL (ref 70–99)
POTASSIUM: 4.4 meq/L (ref 3.5–5.1)
Sodium: 139 mEq/L (ref 135–145)

## 2018-09-17 ENCOUNTER — Ambulatory Visit (INDEPENDENT_AMBULATORY_CARE_PROVIDER_SITE_OTHER): Payer: Medicare Other | Admitting: Internal Medicine

## 2018-09-17 ENCOUNTER — Encounter: Payer: Self-pay | Admitting: Internal Medicine

## 2018-09-17 VITALS — BP 124/78 | HR 97 | Temp 98.2°F | Wt 197.6 lb

## 2018-09-17 DIAGNOSIS — E7800 Pure hypercholesterolemia, unspecified: Secondary | ICD-10-CM

## 2018-09-17 DIAGNOSIS — I10 Essential (primary) hypertension: Secondary | ICD-10-CM

## 2018-09-17 DIAGNOSIS — E559 Vitamin D deficiency, unspecified: Secondary | ICD-10-CM | POA: Diagnosis not present

## 2018-09-17 DIAGNOSIS — M8580 Other specified disorders of bone density and structure, unspecified site: Secondary | ICD-10-CM | POA: Diagnosis not present

## 2018-09-17 DIAGNOSIS — D649 Anemia, unspecified: Secondary | ICD-10-CM

## 2018-09-17 DIAGNOSIS — Z0000 Encounter for general adult medical examination without abnormal findings: Secondary | ICD-10-CM | POA: Diagnosis not present

## 2018-09-17 DIAGNOSIS — E2839 Other primary ovarian failure: Secondary | ICD-10-CM

## 2018-09-17 NOTE — Assessment & Plan Note (Signed)
Follow vitamin D level.  

## 2018-09-17 NOTE — Assessment & Plan Note (Signed)
Schedule f/u bone density.  Continue exercise.

## 2018-09-17 NOTE — Assessment & Plan Note (Signed)
Blood pressure under good control.  Continue same medication regimen.  Follow pressures.  Follow metabolic panel.   

## 2018-09-17 NOTE — Progress Notes (Signed)
Patient ID: Janice Floyd, female   DOB: 04-03-1951, 68 y.o.   MRN: [id number]   Subjective:    Patient ID: Janice Floyd, female    DOB: Sep 05, 1950, 68 y.o.   MRN: [id number]  HPI  Patient with past histofy of hypercholesterolemia and hypertension.  She comes in today to follow up on these issues as well as for a complete physical exam.  Feels good.  Tries to stay active.  No chest pain.  No sob.  No acid reflux. No abdominal pain.  Bowels moving.  No urine change.  Discussed labs.  Saw [facility] for her left knee pain - osteoarthritis.  Referred to PT.  Doing better. Knee is better.  Feels good.     Past Medical History:  Diagnosis Date  . Anemia    iron deficient  . Depression   . Hyperlipemia   . Hypertension   . Vitamin D deficiency    Past Surgical History:  Procedure Laterality Date  . APPENDECTOMY    . COLONOSCOPY WITH PROPOFOL N/A 07/15/2015   Procedure: COLONOSCOPY WITH PROPOFOL;  Surgeon: Hulen Luster, MD;  Location: [facility] ENDOSCOPY;  Service: Gastroenterology;  Laterality: N/A;  . CYSTOSCOPY WITH BIOPSY N/A 04/16/2017   Procedure: CYSTOSCOPY WITH BLADDER NECK BIOPSY AND URETHRAL BIOPSY;  Surgeon: Hollice Espy, MD;  Location: [facility] ORS;  Service: Urology;  Laterality: N/A;  . MYOMECTOMY     x2  . UTERINE FIBROID SURGERY  1978   Family History  Problem Relation Age of Onset  . Lung cancer Father   . Heart disease Mother        congestive heart failure  . Hypertension Mother   . Lung cancer Brother   . Diabetes Brother   . Colon polyps Sister   . Breast cancer Neg Hx   . Prostate cancer Neg Hx   . Kidney cancer Neg Hx    Social History   Socioeconomic History  . Marital status: Married    Spouse name: Not on file  . Number of children: Not on file  . Years of education: Not on file  . Highest education level: Not on file  Occupational History  . Not on file  Social Needs  . Financial resource strain: Not hard at all  . Food insecurity:    Worry: Never  true    Inability: Never true  . Transportation needs:    Medical: No    Non-medical: No  Tobacco Use  . Smoking status: Former Research scientist (life sciences)  . Smokeless tobacco: Never Used  . Tobacco comment: quit smoking 1990  Substance and Sexual Activity  . Alcohol use: Yes    Alcohol/week: 0.0 standard drinks    Comment: glass of wine per day  . Drug use: No  . Sexual activity: Not Currently  Lifestyle  . Physical activity:    Days per week: 2 days    Minutes per session: 60 min  . Stress: Not at all  Relationships  . Social connections:    Talks on phone: Not on file    Gets together: Not on file    Attends religious service: Not on file    Active member of club or organization: Not on file    Attends meetings of clubs or organizations: Not on file    Relationship status: Married  Other Topics Concern  . Not on file  Social History Narrative  . Not on file    Outpatient Encounter Medications as of 09/17/2018  Medication  Sig  . Cholecalciferol (VITAMIN D) 2000 units CAPS Take 2,000 Units by mouth daily.  . fluticasone (FLONASE) 50 MCG/ACT nasal spray Place 1 spray into both nostrils as needed for allergies or rhinitis. (Patient taking differently: Place 2 sprays into both nostrils as needed for allergies or rhinitis. )  . lisinopril (PRINIVIL,ZESTRIL) 10 MG tablet TAKE 1 TABLET BY MOUTH EVERY DAY  . rosuvastatin (CRESTOR) 10 MG tablet TAKE 1 TABLET BY MOUTH EVERY DAY  . vitamin E 400 UNIT capsule Take 400 Units by mouth daily.   No facility-administered encounter medications on file as of 09/17/2018.     Review of Systems  Constitutional: Negative for appetite change and unexpected weight change.  HENT: Negative for congestion and sinus pressure.   Eyes: Negative for pain and visual disturbance.  Respiratory: Negative for cough, chest tightness and shortness of breath.   Cardiovascular: Negative for chest pain, palpitations and leg swelling.  Gastrointestinal: Negative for abdominal  pain, diarrhea, nausea and vomiting.  Genitourinary: Negative for difficulty urinating and dysuria.  Musculoskeletal: Negative for myalgias.       Previous left knee pain and swelling.  Saw ortho.  Swelling better.  Going to PT.  Minimal discomfort now.    Skin: Negative for color change and rash.  Neurological: Negative for dizziness, light-headedness and headaches.  Hematological: Negative for adenopathy. Does not bruise/bleed easily.  Psychiatric/Behavioral: Negative for agitation and dysphoric mood.       Objective:    Physical Exam Constitutional:      General: She is not in acute distress.    Appearance: Normal appearance. She is well-developed.  HENT:     Nose: Nose normal. No congestion.     Mouth/Throat:     Pharynx: No oropharyngeal exudate or posterior oropharyngeal erythema.  Eyes:     General: No scleral icterus.       Right eye: No discharge.        Left eye: No discharge.  Neck:     Musculoskeletal: Neck supple. No muscular tenderness.     Thyroid: No thyromegaly.  Cardiovascular:     Rate and Rhythm: Normal rate and regular rhythm.  Pulmonary:     Effort: No tachypnea, accessory muscle usage or respiratory distress.     Breath sounds: Normal breath sounds. No decreased breath sounds or wheezing.  Chest:     Breasts:        Right: No inverted nipple, mass, nipple discharge or tenderness (no axillary adenopathy).        Left: No inverted nipple, mass, nipple discharge or tenderness (no axilarry adenopathy).  Abdominal:     General: Bowel sounds are normal.     Palpations: Abdomen is soft.     Tenderness: There is no abdominal tenderness.  Musculoskeletal:        General: No swelling or tenderness.  Lymphadenopathy:     Cervical: No cervical adenopathy.  Skin:    Findings: No erythema or rash.  Neurological:     Mental Status: She is alert and oriented to person, place, and time.  Psychiatric:        Mood and Affect: Mood normal.        Behavior:  Behavior normal.     BP 124/78   Pulse 97   Temp 98.2 F (36.8 C) (Oral)   Wt 197 lb 9.6 oz (89.6 kg)   LMP 10/15/1999   SpO2 98%   BMI 32.88 kg/m  Wt Readings from Last 3 Encounters:  09/17/18  197 lb 9.6 oz (89.6 kg)  07/16/18 200 lb (90.7 kg)  12/13/17 199 lb 12.8 oz (90.6 kg)     Lab Results  Component Value Date   WBC 5.6 12/11/2017   HGB 13.0 12/11/2017   HCT 39.6 12/11/2017   PLT 232.0 12/11/2017   GLUCOSE 78 09/16/2018   CHOL 142 09/16/2018   TRIG 39.0 09/16/2018   HDL 57.90 09/16/2018   LDLCALC 77 09/16/2018   ALT 10 09/16/2018   AST 15 09/16/2018   NA 139 09/16/2018   K 4.4 09/16/2018   CL 103 09/16/2018   CREATININE 0.97 09/16/2018   BUN 16 09/16/2018   CO2 28 09/16/2018   TSH 2.17 12/11/2017    Mm 3d Screen Breast Bilateral  Result Date: 08/21/2018 CLINICAL DATA:  Screening. EXAM: DIGITAL SCREENING BILATERAL MAMMOGRAM WITH TOMO AND CAD COMPARISON:  Previous exam(s). ACR Breast Density Category b: There are scattered areas of fibroglandular density. FINDINGS: There are no findings suspicious for malignancy. Images were processed with CAD. IMPRESSION: No mammographic evidence of malignancy. A result letter of this screening mammogram will be mailed directly to the patient. RECOMMENDATION: Screening mammogram in one year. (Code:SM-B-01Y) BI-RADS CATEGORY  1: Negative. Electronically Signed   By: Kristopher Oppenheim M.D.   On: 08/21/2018 10:46       Assessment & Plan:   Problem List Items Addressed This Visit    Anemia    Follow cbc.       Essential hypertension, benign    Blood pressure under good control.  Continue same medication regimen.  Follow pressures.  Follow metabolic panel.        Relevant Orders   Basic metabolic panel   Health care maintenance    Physical today 09/17/18.  PAP 01/10/13 - negative with negative HPV.  Colonoscopy 07/2015 - three polyps and diverticulosis.  Recommended f/u colonoscopy in 5 years.  Mammogram 08/21/18 - [facility] I.         Hypercholesterolemia    On crestor.  Low cholesterol diet and exercise.  Follow lipid panel and liver function tests.        Relevant Orders   Hepatic function panel   Lipid panel   Osteopenia    Schedule f/u bone density.  Continue exercise.        Vitamin D deficiency    Follow vitamin D level.         Other Visit Diagnoses    Estrogen deficiency    -  Primary   Relevant Orders   DG Bone Density       Einar Pheasant, MD

## 2018-09-17 NOTE — Assessment & Plan Note (Signed)
Physical today 09/17/18.  PAP 01/10/13 - negative with negative HPV.  Colonoscopy 07/2015 - three polyps and diverticulosis.  Recommended f/u colonoscopy in 5 years.  Mammogram 08/21/18 - [facility] I.

## 2018-09-17 NOTE — Assessment & Plan Note (Signed)
Follow cbc.  

## 2018-09-18 DIAGNOSIS — M25562 Pain in left knee: Secondary | ICD-10-CM | POA: Diagnosis not present

## 2018-09-18 DIAGNOSIS — M222X2 Patellofemoral disorders, left knee: Secondary | ICD-10-CM | POA: Diagnosis not present

## 2018-09-22 ENCOUNTER — Encounter: Payer: Self-pay | Admitting: Internal Medicine

## 2018-09-22 NOTE — Assessment & Plan Note (Signed)
On crestor.  Low cholesterol diet and exercise.  Follow lipid panel and liver function tests.   

## 2018-09-24 DIAGNOSIS — M222X2 Patellofemoral disorders, left knee: Secondary | ICD-10-CM | POA: Diagnosis not present

## 2018-09-24 DIAGNOSIS — M25562 Pain in left knee: Secondary | ICD-10-CM | POA: Diagnosis not present

## 2018-09-26 DIAGNOSIS — M25562 Pain in left knee: Secondary | ICD-10-CM | POA: Diagnosis not present

## 2018-09-26 DIAGNOSIS — M222X2 Patellofemoral disorders, left knee: Secondary | ICD-10-CM | POA: Diagnosis not present

## 2018-10-01 DIAGNOSIS — Z780 Asymptomatic menopausal state: Secondary | ICD-10-CM | POA: Diagnosis not present

## 2018-10-01 LAB — HM DEXA SCAN: HM DEXA SCAN: NORMAL

## 2018-10-04 DIAGNOSIS — M222X2 Patellofemoral disorders, left knee: Secondary | ICD-10-CM | POA: Diagnosis not present

## 2018-10-04 DIAGNOSIS — M25562 Pain in left knee: Secondary | ICD-10-CM | POA: Diagnosis not present

## 2018-10-06 ENCOUNTER — Other Ambulatory Visit: Payer: Self-pay | Admitting: Internal Medicine

## 2018-10-08 DIAGNOSIS — M222X2 Patellofemoral disorders, left knee: Secondary | ICD-10-CM | POA: Diagnosis not present

## 2018-10-08 DIAGNOSIS — M25562 Pain in left knee: Secondary | ICD-10-CM | POA: Diagnosis not present

## 2018-10-10 DIAGNOSIS — M222X2 Patellofemoral disorders, left knee: Secondary | ICD-10-CM | POA: Diagnosis not present

## 2018-10-10 DIAGNOSIS — M25562 Pain in left knee: Secondary | ICD-10-CM | POA: Diagnosis not present

## 2018-10-13 ENCOUNTER — Telehealth: Payer: Self-pay | Admitting: Internal Medicine

## 2018-10-13 NOTE — Telephone Encounter (Signed)
Opened in error

## 2018-10-17 ENCOUNTER — Telehealth: Payer: Self-pay

## 2018-10-17 NOTE — Telephone Encounter (Signed)
Pt returned call.  Informed pt that bone density was normal per Larena Glassman, LPN.

## 2018-10-17 NOTE — Telephone Encounter (Signed)
LMTCB. Calling to notify that bone density was normal.

## 2019-01-01 ENCOUNTER — Other Ambulatory Visit: Payer: Self-pay | Admitting: Internal Medicine

## 2019-03-20 ENCOUNTER — Other Ambulatory Visit (INDEPENDENT_AMBULATORY_CARE_PROVIDER_SITE_OTHER): Payer: Medicare Other

## 2019-03-20 ENCOUNTER — Other Ambulatory Visit: Payer: Self-pay

## 2019-03-20 DIAGNOSIS — E7800 Pure hypercholesterolemia, unspecified: Secondary | ICD-10-CM | POA: Diagnosis not present

## 2019-03-20 DIAGNOSIS — I10 Essential (primary) hypertension: Secondary | ICD-10-CM

## 2019-03-20 LAB — BASIC METABOLIC PANEL
BUN: 13 mg/dL (ref 6–23)
CO2: 29 mEq/L (ref 19–32)
Calcium: 9.5 mg/dL (ref 8.4–10.5)
Chloride: 103 mEq/L (ref 96–112)
Creatinine, Ser: 0.98 mg/dL (ref 0.40–1.20)
GFR: 67.81 mL/min (ref >60.00)
Glucose, Bld: 84 mg/dL (ref 70–99)
Potassium: 4.1 mEq/L (ref 3.5–5.1)
Sodium: 138 mEq/L (ref 135–145)

## 2019-03-20 LAB — HEPATIC FUNCTION PANEL
ALT: 8 U/L (ref 0–35)
AST: 14 U/L (ref 0–37)
Albumin: 4.2 g/dL (ref 3.5–5.2)
Alkaline Phosphatase: 47 U/L (ref 39–117)
Bilirubin, Direct: 0.2 mg/dL (ref 0.0–0.3)
Total Bilirubin: 0.7 mg/dL (ref 0.2–1.2)
Total Protein: 7.2 g/dL (ref 6.0–8.3)

## 2019-03-20 LAB — LIPID PANEL
Cholesterol: 150 mg/dL (ref 0–200)
HDL: 72.4 mg/dL (ref >39.00)
LDL Cholesterol: 69 mg/dL (ref 0–99)
NonHDL: 78.03
Total CHOL/HDL Ratio: 2
Triglycerides: 45 mg/dL (ref 0.0–149.0)
VLDL: 9 mg/dL (ref 0.0–40.0)

## 2019-03-21 ENCOUNTER — Other Ambulatory Visit: Payer: Medicare Other

## 2019-03-24 ENCOUNTER — Other Ambulatory Visit: Payer: Self-pay

## 2019-03-24 ENCOUNTER — Encounter: Payer: Self-pay | Admitting: Internal Medicine

## 2019-03-24 ENCOUNTER — Ambulatory Visit (INDEPENDENT_AMBULATORY_CARE_PROVIDER_SITE_OTHER): Payer: Medicare Other | Admitting: Internal Medicine

## 2019-03-24 DIAGNOSIS — E559 Vitamin D deficiency, unspecified: Secondary | ICD-10-CM

## 2019-03-24 DIAGNOSIS — I10 Essential (primary) hypertension: Secondary | ICD-10-CM | POA: Diagnosis not present

## 2019-03-24 DIAGNOSIS — Z9109 Other allergy status, other than to drugs and biological substances: Secondary | ICD-10-CM | POA: Diagnosis not present

## 2019-03-24 DIAGNOSIS — E7800 Pure hypercholesterolemia, unspecified: Secondary | ICD-10-CM | POA: Diagnosis not present

## 2019-03-24 DIAGNOSIS — D649 Anemia, unspecified: Secondary | ICD-10-CM

## 2019-03-24 NOTE — Assessment & Plan Note (Signed)
On crestor.  Low cholesterol diet and exercise. Will follow lipid panel and liver function tests.   Lab Results  Component Value Date   CHOL 150 03/20/2019   HDL 72.40 03/20/2019   LDLCALC 69 03/20/2019   TRIG 45.0 03/20/2019   CHOLHDL 2 03/20/2019

## 2019-03-24 NOTE — Assessment & Plan Note (Signed)
Blood pressure under good control.  Continue same medication regimen.  Follow pressures.  Follow metabolic panel.   

## 2019-03-24 NOTE — Assessment & Plan Note (Signed)
Follow vitamin D level.  

## 2019-03-24 NOTE — Progress Notes (Signed)
Patient ID: Janice Floyd, female   DOB: June 15, 1951, 68 y.o.   MRN: [id number]   Subjective:    Patient ID: Janice Floyd, female    DOB: 1951-01-08, 68 y.o.   MRN: [id number]  HPI  Patient here for a scheduled follow up.  She reports she is doing relatively well.  Stays active.  Evaluated by ortho for her left knee ([facility]).  Referred physical therapy.  Knee is doing better.  She has exercises she can do at home.  Does not limit her activity.  She works in her yard and walks daily.  No chest pain.  No sob.  No acid reflux.  No abdominal pain.  Bowels moving.  Some crusting of her eyes in am.  Resolves once up and moving.  No redness. No pain.  No swelling.overall feels good.       Past Medical History:  Diagnosis Date  . Anemia    iron deficient  . Depression   . Hyperlipemia   . Hypertension   . Vitamin D deficiency    Past Surgical History:  Procedure Laterality Date  . APPENDECTOMY    . COLONOSCOPY WITH PROPOFOL N/A 07/15/2015   Procedure: COLONOSCOPY WITH PROPOFOL;  Surgeon: Hulen Luster, MD;  Location: [facility] ENDOSCOPY;  Service: Gastroenterology;  Laterality: N/A;  . CYSTOSCOPY WITH BIOPSY N/A 04/16/2017   Procedure: CYSTOSCOPY WITH BLADDER NECK BIOPSY AND URETHRAL BIOPSY;  Surgeon: Hollice Espy, MD;  Location: [facility] ORS;  Service: Urology;  Laterality: N/A;  . MYOMECTOMY     x2  . UTERINE FIBROID SURGERY  1978   Family History  Problem Relation Age of Onset  . Lung cancer Father   . Heart disease Mother        congestive heart failure  . Hypertension Mother   . Lung cancer Brother   . Diabetes Brother   . Colon polyps Sister   . Breast cancer Neg Hx   . Prostate cancer Neg Hx   . Kidney cancer Neg Hx    Social History   Socioeconomic History  . Marital status: Married    Spouse name: Not on file  . Number of children: Not on file  . Years of education: Not on file  . Highest education level: Not on file  Occupational History  . Not on file  Social Needs  .  Financial resource strain: Not hard at all  . Food insecurity    Worry: Never true    Inability: Never true  . Transportation needs    Medical: No    Non-medical: No  Tobacco Use  . Smoking status: Former Research scientist (life sciences)  . Smokeless tobacco: Never Used  . Tobacco comment: quit smoking 1990  Substance and Sexual Activity  . Alcohol use: Yes    Alcohol/week: 0.0 standard drinks    Comment: glass of wine per day  . Drug use: No  . Sexual activity: Not Currently  Lifestyle  . Physical activity    Days per week: 2 days    Minutes per session: 60 min  . Stress: Not at all  Relationships  . Social Herbalist on phone: Not on file    Gets together: Not on file    Attends religious service: Not on file    Active member of club or organization: Not on file    Attends meetings of clubs or organizations: Not on file    Relationship status: Married  Other Topics Concern  . Not on  file  Social History Narrative  . Not on file    Outpatient Encounter Medications as of 03/24/2019  Medication Sig  . Cholecalciferol (VITAMIN D) 2000 units CAPS Take 2,000 Units by mouth daily.  . fluticasone (FLONASE) 50 MCG/ACT nasal spray Place 1 spray into both nostrils as needed for allergies or rhinitis. (Patient taking differently: Place 2 sprays into both nostrils as needed for allergies or rhinitis. )  . lisinopril (ZESTRIL) 10 MG tablet TAKE 1 TABLET BY MOUTH EVERY DAY  . vitamin E 400 UNIT capsule Take 400 Units by mouth daily.  . [DISCONTINUED] rosuvastatin (CRESTOR) 10 MG tablet TAKE 1 TABLET BY MOUTH EVERY DAY   No facility-administered encounter medications on file as of 03/24/2019.     Review of Systems  Constitutional: Negative for appetite change and unexpected weight change.  HENT: Negative for congestion and sinus pressure.   Respiratory: Negative for cough, chest tightness and shortness of breath.   Cardiovascular: Negative for chest pain, palpitations and leg swelling.   Gastrointestinal: Negative for abdominal pain, diarrhea, nausea and vomiting.  Genitourinary: Negative for difficulty urinating and dysuria.  Musculoskeletal: Negative for joint swelling and myalgias.  Skin: Negative for color change and rash.  Neurological: Negative for dizziness, light-headedness and headaches.  Psychiatric/Behavioral: Negative for agitation and dysphoric mood.       Objective:    Physical Exam Constitutional:      General: She is not in acute distress.    Appearance: Normal appearance.  HENT:     Right Ear: External ear normal.     Left Ear: External ear normal.  Eyes:     General: No scleral icterus.       Right eye: No discharge.        Left eye: No discharge.     Conjunctiva/sclera: Conjunctivae normal.  Neck:     Musculoskeletal: Neck supple. No muscular tenderness.     Thyroid: No thyromegaly.  Cardiovascular:     Rate and Rhythm: Normal rate and regular rhythm.  Pulmonary:     Effort: No respiratory distress.     Breath sounds: Normal breath sounds. No wheezing.  Abdominal:     General: Bowel sounds are normal.     Palpations: Abdomen is soft.     Tenderness: There is no abdominal tenderness.  Musculoskeletal:        General: No swelling or tenderness.  Lymphadenopathy:     Cervical: No cervical adenopathy.  Skin:    Findings: No erythema or rash.  Neurological:     Mental Status: She is alert.  Psychiatric:        Mood and Affect: Mood normal.        Behavior: Behavior normal.     BP 126/70   Pulse 78   Temp (!) 95.8 F (35.4 C) (Temporal)   Resp 16   Wt 196 lb 12.8 oz (89.3 kg)   LMP 10/15/1999   SpO2 98%   BMI 32.75 kg/m  Wt Readings from Last 3 Encounters:  03/24/19 196 lb 12.8 oz (89.3 kg)  09/17/18 197 lb 9.6 oz (89.6 kg)  07/16/18 200 lb (90.7 kg)     Lab Results  Component Value Date   WBC 5.6 12/11/2017   HGB 13.0 12/11/2017   HCT 39.6 12/11/2017   PLT 232.0 12/11/2017   GLUCOSE 84 03/20/2019   CHOL 150  03/20/2019   TRIG 45.0 03/20/2019   HDL 72.40 03/20/2019   LDLCALC 69 03/20/2019   ALT 8 03/20/2019  AST 14 03/20/2019   NA 138 03/20/2019   K 4.1 03/20/2019   CL 103 03/20/2019   CREATININE 0.98 03/20/2019   BUN 13 03/20/2019   CO2 29 03/20/2019   TSH 2.17 12/11/2017    Mm 3d Screen Breast Bilateral  Result Date: 08/21/2018 CLINICAL DATA:  Screening. EXAM: DIGITAL SCREENING BILATERAL MAMMOGRAM WITH TOMO AND CAD COMPARISON:  Previous exam(s). ACR Breast Density Category b: There are scattered areas of fibroglandular density. FINDINGS: There are no findings suspicious for malignancy. Images were processed with CAD. IMPRESSION: No mammographic evidence of malignancy. A result letter of this screening mammogram will be mailed directly to the patient. RECOMMENDATION: Screening mammogram in one year. (Code:SM-B-01Y) BI-RADS CATEGORY  1: Negative. Electronically Signed   By: Kristopher Oppenheim M.D.   On: 08/21/2018 10:46       Assessment & Plan:   Problem List Items Addressed This Visit    Anemia    Follow cbc.       Relevant Orders   CBC with Differential/Platelet   Environmental allergies    No allergy symptoms currently.  Does report some eye crusting in am.  Discussed artificial tears.  Follow.  No itching.  No redness.  Will notify me if persistent problems.        Essential hypertension, benign    Blood pressure under good control.  Continue same medication regimen.  Follow pressures.  Follow metabolic panel.        Relevant Orders   TSH   Basic metabolic panel   Hypercholesterolemia    On crestor.  Low cholesterol diet and exercise. Will follow lipid panel and liver function tests.   Lab Results  Component Value Date   CHOL 150 03/20/2019   HDL 72.40 03/20/2019   LDLCALC 69 03/20/2019   TRIG 45.0 03/20/2019   CHOLHDL 2 03/20/2019        Relevant Orders   Hepatic function panel   Lipid panel   Vitamin D deficiency    Follow vitamin D level.             Einar Pheasant, MD

## 2019-03-27 ENCOUNTER — Other Ambulatory Visit: Payer: Medicare Other

## 2019-03-28 ENCOUNTER — Other Ambulatory Visit: Payer: Self-pay | Admitting: Internal Medicine

## 2019-03-29 ENCOUNTER — Encounter: Payer: Self-pay | Admitting: Internal Medicine

## 2019-03-29 ENCOUNTER — Other Ambulatory Visit: Payer: Self-pay | Admitting: Internal Medicine

## 2019-03-29 DIAGNOSIS — R319 Hematuria, unspecified: Secondary | ICD-10-CM

## 2019-03-29 NOTE — Assessment & Plan Note (Signed)
No allergy symptoms currently.  Does report some eye crusting in am.  Discussed artificial tears.  Follow.  No itching.  No redness.  Will notify me if persistent problems.

## 2019-03-29 NOTE — Progress Notes (Signed)
Order placed for f/u urinalysis 

## 2019-03-29 NOTE — Assessment & Plan Note (Signed)
Follow cbc.  

## 2019-06-23 ENCOUNTER — Other Ambulatory Visit: Payer: Self-pay | Admitting: Internal Medicine

## 2019-07-14 ENCOUNTER — Other Ambulatory Visit: Payer: Self-pay | Admitting: Internal Medicine

## 2019-07-14 DIAGNOSIS — Z1231 Encounter for screening mammogram for malignant neoplasm of breast: Secondary | ICD-10-CM

## 2019-07-22 ENCOUNTER — Encounter: Payer: Self-pay | Admitting: Internal Medicine

## 2019-07-22 ENCOUNTER — Other Ambulatory Visit: Payer: Self-pay

## 2019-07-22 ENCOUNTER — Ambulatory Visit (INDEPENDENT_AMBULATORY_CARE_PROVIDER_SITE_OTHER): Payer: Medicare Other | Admitting: Internal Medicine

## 2019-07-22 ENCOUNTER — Ambulatory Visit: Payer: Medicare Other

## 2019-07-22 DIAGNOSIS — Z86010 Personal history of colonic polyps: Secondary | ICD-10-CM | POA: Diagnosis not present

## 2019-07-22 DIAGNOSIS — D649 Anemia, unspecified: Secondary | ICD-10-CM

## 2019-07-22 DIAGNOSIS — E7800 Pure hypercholesterolemia, unspecified: Secondary | ICD-10-CM

## 2019-07-22 DIAGNOSIS — Z9109 Other allergy status, other than to drugs and biological substances: Secondary | ICD-10-CM

## 2019-07-22 NOTE — Progress Notes (Signed)
Patient ID: Janice Floyd, female   DOB: 14-Feb-1951, 68 y.o.   MRN: [id number]   Virtual Visit via video Note  This visit type was conducted due to national recommendations for restrictions regarding the COVID-19 pandemic (e.g. social distancing).  This format is felt to be most appropriate for this patient at this time.  All issues noted in this document were discussed and addressed.  No physical exam was performed (except for noted visual exam findings with Video Visits).   I connected with Darlyne Russian by a video enabled telemedicine application and verified that I am speaking with the correct person using two identifiers. Location patient: home Location provider: work Persons participating in the virtual visit: patient, provider  I discussed the limitations, risks, security and privacy concerns of performing an evaluation and management service by video and the availability of in person appointments.  The patient expressed understanding and agreed to proceed.   Reason for visit: scheduled follow up.    HPI: She reports she is doing well.  Feels good.  Tries to stay active.  Has been walking and working in her yard.  No chest pain.  No sob.  No acid reflux.  No abdominal pain. Bowels moving.  Blood pressure doing well - averaging 130/75.  Still some issues with her left knee, but it is overall improved.  Worse in am.  Does her exercises she was given at therapy and this helps.  Does not feel needs any further intervention at this time.  Had questions about covid vaccine.  Discussed colonoscopy due end of 2021.     ROS: See pertinent positives and negatives per HPI.  Past Medical History:  Diagnosis Date  . Anemia    iron deficient  . Depression   . Hyperlipemia   . Hypertension   . Vitamin D deficiency     Past Surgical History:  Procedure Laterality Date  . APPENDECTOMY    . COLONOSCOPY WITH PROPOFOL N/A 07/15/2015   Procedure: COLONOSCOPY WITH PROPOFOL;  Surgeon: Hulen Luster,  MD;  Location: [facility] ENDOSCOPY;  Service: Gastroenterology;  Laterality: N/A;  . CYSTOSCOPY WITH BIOPSY N/A 04/16/2017   Procedure: CYSTOSCOPY WITH BLADDER NECK BIOPSY AND URETHRAL BIOPSY;  Surgeon: Hollice Espy, MD;  Location: [facility] ORS;  Service: Urology;  Laterality: N/A;  . MYOMECTOMY     x2  . UTERINE FIBROID SURGERY  1978    Family History  Problem Relation Age of Onset  . Lung cancer Father   . Heart disease Mother        congestive heart failure  . Hypertension Mother   . Lung cancer Brother   . Diabetes Brother   . Colon polyps Sister   . Breast cancer Neg Hx   . Prostate cancer Neg Hx   . Kidney cancer Neg Hx     SOCIAL HX: reviewed.    Current Outpatient Medications:  .  Cholecalciferol (VITAMIN D) 2000 units CAPS, Take 2,000 Units by mouth daily., Disp: , Rfl:  .  fluticasone (FLONASE) 50 MCG/ACT nasal spray, Place 1 spray into both nostrils as needed for allergies or rhinitis. (Patient taking differently: Place 2 sprays into both nostrils as needed for allergies or rhinitis. ), Disp: 16 g, Rfl: 6 .  lisinopril (ZESTRIL) 10 MG tablet, TAKE 1 TABLET BY MOUTH EVERY DAY, Disp: 90 tablet, Rfl: 1 .  rosuvastatin (CRESTOR) 10 MG tablet, TAKE 1 TABLET BY MOUTH EVERY DAY, Disp: 90 tablet, Rfl: 1 .  vitamin E 400 UNIT  capsule, Take 400 Units by mouth daily., Disp: , Rfl:   EXAM:  VITALS per patient if applicable: 0000000  GENERAL: alert, oriented, appears well and in no acute distress  HEENT: atraumatic, conjunttiva clear, no obvious abnormalities on inspection of external nose and ears  NECK: normal movements of the head and neck  LUNGS: on inspection no signs of respiratory distress, breathing rate appears normal, no obvious gross SOB, gasping or wheezing  CV: no obvious cyanosis  PSYCH/NEURO: pleasant and cooperative, no obvious depression or anxiety, speech and thought processing grossly intact  ASSESSMENT AND PLAN:  Discussed the following assessment and  plan:  Anemia Follow cbc.   Environmental allergies Controlled.    History of colonic polyps Colonoscopy 07/2015.  Recommended f/u in 5 years.    Hypercholesterolemia On crestor.  Low cholesterol diet and exercise.  Follow lipid panel.      I discussed the assessment and treatment plan with the patient. The patient was provided an opportunity to ask questions and all were answered. The patient agreed with the plan and demonstrated an understanding of the instructions.   The patient was advised to call back or seek an in-person evaluation if the symptoms worsen or if the condition fails to improve as anticipated.   Einar Pheasant, MD

## 2019-07-23 ENCOUNTER — Ambulatory Visit (INDEPENDENT_AMBULATORY_CARE_PROVIDER_SITE_OTHER): Payer: Medicare Other

## 2019-07-23 DIAGNOSIS — Z0000 Encounter for general adult medical examination without abnormal findings: Secondary | ICD-10-CM | POA: Diagnosis not present

## 2019-07-23 NOTE — Patient Instructions (Addendum)
  Ms. Farman , Thank you for taking time to come for your Medicare Wellness Visit. I appreciate your ongoing commitment to your health goals. Please review the following plan we discussed and let me know if I can assist you in the future.   These are the goals we discussed: Goals      Patient Stated   . I want to ... (pt-stated)     Drink an adequate amount of water daily Monitor my carbs/sugar intake Start completing jigsaw puzzles again       This is a list of the screening recommended for you and due dates:  Health Maintenance  Topic Date Due  . Tetanus Vaccine  08/08/2019  . Mammogram  08/22/2019  . Colon Cancer Screening  07/14/2020  . DEXA scan (bone density measurement)  Completed  .  Hepatitis C: One time screening is recommended by Center for Disease Control  (CDC) for  adults born from [birth year] through 1967.   Completed  . Pneumonia vaccines  Completed  . Flu Shot  Discontinued

## 2019-07-23 NOTE — Progress Notes (Addendum)
Subjective:   Janice Floyd is a 68 y.o. female who presents for Medicare Annual (Subsequent) preventive examination.  Review of Systems:  No ROS.  Medicare Wellness Virtual Visit.  Visual/audio telehealth visit, UTA vital signs.   See social history for additional risk factors.   Cardiac Risk Factors include: advanced age (>29men, >72 women);hypertension     Objective:     Vitals: LMP 10/15/1999   There is no height or weight on file to calculate BMI.  Advanced Directives 07/23/2019 07/16/2018 07/13/2017 04/16/2017 04/13/2017 07/13/2016  Does Patient Have a Medical Advance Directive? No No No No No No  Does patient want to make changes to medical advance directive? No - Patient declined - - - - -  Would patient like information on creating a medical advance directive? - No - Patient declined Yes (MAU/Ambulatory/Procedural Areas - Information given) No - Patient declined No - Patient declined Yes (MAU/Ambulatory/Procedural Areas - Information given)    Tobacco Social History   Tobacco Use  Smoking Status Former Smoker  Smokeless Tobacco Never Used  Tobacco Comment   quit smoking 1990     Counseling given: Not Answered Comment: quit smoking 1990   Clinical Intake:  Pre-visit preparation completed: Yes        Diabetes: No  How often do you need to have someone help you when you read instructions, pamphlets, or other written materials from your doctor or pharmacy?: 1 - Never  Interpreter Needed?: No     Past Medical History:  Diagnosis Date  . Anemia    iron deficient  . Depression   . Hyperlipemia   . Hypertension   . Vitamin D deficiency    Past Surgical History:  Procedure Laterality Date  . APPENDECTOMY    . COLONOSCOPY WITH PROPOFOL N/A 07/15/2015   Procedure: COLONOSCOPY WITH PROPOFOL;  Surgeon: Hulen Luster, MD;  Location: [facility] ENDOSCOPY;  Service: Gastroenterology;  Laterality: N/A;  . CYSTOSCOPY WITH BIOPSY N/A 04/16/2017   Procedure: CYSTOSCOPY  WITH BLADDER NECK BIOPSY AND URETHRAL BIOPSY;  Surgeon: Hollice Espy, MD;  Location: [facility] ORS;  Service: Urology;  Laterality: N/A;  . MYOMECTOMY     x2  . UTERINE FIBROID SURGERY  1978   Family History  Problem Relation Age of Onset  . Lung cancer Father   . Heart disease Mother        congestive heart failure  . Hypertension Mother   . Lung cancer Brother   . Diabetes Brother   . Colon polyps Sister   . Breast cancer Neg Hx   . Prostate cancer Neg Hx   . Kidney cancer Neg Hx    Social History   Socioeconomic History  . Marital status: Married    Spouse name: Not on file  . Number of children: Not on file  . Years of education: Not on file  . Highest education level: Not on file  Occupational History  . Not on file  Tobacco Use  . Smoking status: Former Research scientist (life sciences)  . Smokeless tobacco: Never Used  . Tobacco comment: quit smoking 1990  Substance and Sexual Activity  . Alcohol use: Yes    Alcohol/week: 0.0 standard drinks    Comment: glass of wine per day  . Drug use: No  . Sexual activity: Not Currently  Other Topics Concern  . Not on file  Social History Narrative  . Not on file   Social Determinants of Health   Financial Resource Strain:   . Difficulty  of Paying Living Expenses: Not on file  Food Insecurity:   . Worried About Charity fundraiser in the Last Year: Not on file  . Ran Out of Food in the Last Year: Not on file  Transportation Needs:   . Lack of Transportation (Medical): Not on file  . Lack of Transportation (Non-Medical): Not on file  Physical Activity:   . Days of Exercise per Week: Not on file  . Minutes of Exercise per Session: Not on file  Stress:   . Feeling of Stress : Not on file  Social Connections:   . Frequency of Communication with Friends and Family: Not on file  . Frequency of Social Gatherings with Friends and Family: Not on file  . Attends Religious Services: Not on file  . Active Member of Clubs or Organizations: Not on file    . Attends Archivist Meetings: Not on file  . Marital Status: Not on file    Outpatient Encounter Medications as of 07/23/2019  Medication Sig  . Cholecalciferol (VITAMIN D) 2000 units CAPS Take 2,000 Units by mouth daily.  . fluticasone (FLONASE) 50 MCG/ACT nasal spray Place 1 spray into both nostrils as needed for allergies or rhinitis. (Patient taking differently: Place 2 sprays into both nostrils as needed for allergies or rhinitis. )  . lisinopril (ZESTRIL) 10 MG tablet TAKE 1 TABLET BY MOUTH EVERY DAY  . rosuvastatin (CRESTOR) 10 MG tablet TAKE 1 TABLET BY MOUTH EVERY DAY  . vitamin E 400 UNIT capsule Take 400 Units by mouth daily.   No facility-administered encounter medications on file as of 07/23/2019.    Activities of Daily Living In your present state of health, do you have any difficulty performing the following activities: 07/23/2019  Hearing? N  Vision? N  Difficulty concentrating or making decisions? N  Walking or climbing stairs? N  Dressing or bathing? N  Doing errands, shopping? N  Preparing Food and eating ? N  Using the Toilet? N  In the past six months, have you accidently leaked urine? N  Do you have problems with loss of bowel control? N  Managing your Medications? N  Managing your Finances? N  Housekeeping or managing your Housekeeping? N  Some recent data might be hidden    Patient Care Team: Einar Pheasant, MD as PCP - General (Internal Medicine)    Assessment:   This is a routine wellness examination for Carry.  Nurse connected with patient 07/23/19 at 11:30 AM EST by a telephone enabled telemedicine application and verified that I am speaking with the correct person using two identifiers. Patient stated full name and DOB. Patient gave permission to continue with virtual visit. Patient's location was at home and Nurse's location was at [facility] office.   Patient is alert and oriented x3. Patient denies difficulty focusing or  concentrating. 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   Health Maintenance Due: -Mammogram- scheduled 08/26/19 See completed HM at the end of note.   Eye: Visual acuity not assessed. Virtual visit. Followed by the ophthalmologist.  Dental: Visits every 6 months.    Hearing: Demonstrates normal hearing during visit.  Safety:  Patient feels safe at home- yes Patient does have smoke detectors at home- yes Patient does wear sunscreen or protective clothing when in direct sunlight - yes Patient does wear seat belt when in a moving vehicle - yes Patient drives-  yes Adequate lighting in walkways free from debris- yes Grab bars and handrails used as appropriate- yes Ambulates with an assistive device- no Cell phone on person when ambulating outside of the home- yes  Social: Alcohol intake - yes      Smoking history- former  Smokers in home? none Illicit drug use? none  Medication: Taking as directed and without issues.  Self managed - yes   Covid-19: Precautions and sickness symptoms discussed. Wears mask, social distancing, hand hygiene as appropriate.   Activities of Daily Living Patient denies needing assistance with: household chores, feeding themselves, getting from bed to chair, getting to the toilet, bathing/showering, dressing, managing money, or preparing meals.   Discussed the importance of a healthy diet, water intake and the benefits of aerobic exercise.   Physical activity- post physical therapy exercises  Diet:  Regular Water: fair intake   Other Providers Patient Care Team: Einar Pheasant, MD as PCP - General (Internal Medicine)   Exercise Activities and Dietary recommendations Current Exercise Habits: Home exercise routine, Type of exercise: stretching, Time (Minutes): 30, Frequency (Times/Week): 5, Weekly Exercise (Minutes/Week):  150, Intensity: Mild  Goals      Patient Stated   . I want to ... (pt-stated)     Drink an adequate amount of water daily Monitor my carbs/sugar intake Start completing jigsaw puzzles again       Fall Risk Fall Risk  07/23/2019 07/16/2018 08/09/2017 07/13/2017 01/17/2017  Falls in the past year? 0 0 No No No  Follow up Falls prevention discussed - - - -   Timed Get Up and Go performed: no. Virtual visit  Depression Screen PHQ 2/9 Scores 07/23/2019 03/24/2019 07/16/2018 08/09/2017  PHQ - 2 Score 0 0 0 0  PHQ- 9 Score - 3 - -     Cognitive Function MMSE - Mini Mental State Exam 07/16/2018 07/13/2017  Orientation to time 5 5  Orientation to Place 5 5  Registration 3 3  Attention/ Calculation 5 5  Recall 3 3  Language- name 2 objects 2 2  Language- repeat 1 1  Language- follow 3 step command 3 3  Language- read & follow direction 1 1  Write a sentence 1 1  Copy design 1 1  Total score 30 30     6CIT Screen 07/23/2019 07/13/2016  What Year? 0 points 0 points  What month? 0 points 0 points  What time? 0 points 0 points  Count back from 20 0 points 0 points  Months in reverse 0 points 0 points  Repeat phrase 0 points 0 points  Total Score 0 0    Immunization History  Administered Date(s) Administered  . Influenza, High Dose Seasonal PF 05/10/2016, 06/05/2017, 05/30/2018, 06/20/2019  . Influenza,inj,Quad PF,6+ Mos 06/29/2014, 05/03/2015  . Pneumococcal Conjugate-13 07/21/2014  . Pneumococcal Polysaccharide-23 07/13/2016  . Pneumococcal-Unspecified 08/07/2010  . Zoster 02/23/2014  . Zoster Recombinat (Shingrix) 05/04/2018, 09/03/2018   Screening Tests Health Maintenance  Topic Date Due  . TETANUS/TDAP  08/08/2019  . MAMMOGRAM  08/22/2019  . COLONOSCOPY  07/14/2020  . DEXA SCAN  Completed  . Hepatitis C Screening  Completed  . PNA vac Low Risk Adult  Completed  . INFLUENZA VACCINE  Discontinued      Plan:   Keep all routine maintenance appointments.    Medicare Attestation I have personally reviewed: The patient's medical and social history Their use of alcohol, tobacco or illicit drugs Their current medications and supplements The patient's functional ability including ADLs,fall  risks, home safety risks, cognitive, and hearing and visual impairment Diet and physical activities Evidence for depression   I have reviewed and discussed with patient certain preventive protocols, quality metrics, and best practice recommendations.     Varney Biles, LPN  [id number]   Reviewed above information.  Agree with assessment and plan.    Dr Nicki Reaper

## 2019-07-26 ENCOUNTER — Encounter: Payer: Self-pay | Admitting: Internal Medicine

## 2019-07-26 NOTE — Assessment & Plan Note (Signed)
Colonoscopy 07/2015.  Recommended f/u in 5 years.

## 2019-07-26 NOTE — Assessment & Plan Note (Signed)
On crestor.  Low cholesterol diet and exercise.  Follow lipid panel.  

## 2019-07-26 NOTE — Assessment & Plan Note (Signed)
Follow cbc.  

## 2019-07-26 NOTE — Assessment & Plan Note (Signed)
Controlled.  

## 2019-08-26 ENCOUNTER — Ambulatory Visit
Admission: RE | Admit: 2019-08-26 | Discharge: 2019-08-26 | Disposition: A | Discharge status: Home / Self Care | Payer: Medicare Other | Source: Ambulatory Visit | Attending: Internal Medicine | Admitting: Internal Medicine

## 2019-08-26 DIAGNOSIS — Z1231 Encounter for screening mammogram for malignant neoplasm of breast: Secondary | ICD-10-CM | POA: Diagnosis not present

## 2019-09-05 DIAGNOSIS — H40003 Preglaucoma, unspecified, bilateral: Secondary | ICD-10-CM | POA: Diagnosis not present

## 2019-09-20 ENCOUNTER — Other Ambulatory Visit: Payer: Self-pay | Admitting: Internal Medicine

## 2019-09-29 ENCOUNTER — Other Ambulatory Visit: Payer: Self-pay

## 2019-09-29 ENCOUNTER — Other Ambulatory Visit (INDEPENDENT_AMBULATORY_CARE_PROVIDER_SITE_OTHER): Payer: Medicare Other

## 2019-09-29 DIAGNOSIS — D649 Anemia, unspecified: Secondary | ICD-10-CM

## 2019-09-29 DIAGNOSIS — R319 Hematuria, unspecified: Secondary | ICD-10-CM | POA: Diagnosis not present

## 2019-09-29 DIAGNOSIS — E7800 Pure hypercholesterolemia, unspecified: Secondary | ICD-10-CM | POA: Diagnosis not present

## 2019-09-29 DIAGNOSIS — I10 Essential (primary) hypertension: Secondary | ICD-10-CM | POA: Diagnosis not present

## 2019-09-29 LAB — LIPID PANEL
Cholesterol: 161 mg/dL (ref 0–200)
HDL: 86.3 mg/dL (ref >39.00)
LDL Cholesterol: 67 mg/dL (ref 0–99)
NonHDL: 74.53
Total CHOL/HDL Ratio: 2
Triglycerides: 38 mg/dL (ref 0.0–149.0)
VLDL: 7.6 mg/dL (ref 0.0–40.0)

## 2019-09-29 LAB — CBC WITH DIFFERENTIAL/PLATELET
Basophils Absolute: 0.1 10*3/uL (ref 0.0–0.1)
Basophils Relative: 1.1 % (ref 0.0–3.0)
Eosinophils Absolute: 0.4 10*3/uL (ref 0.0–0.7)
Eosinophils Relative: 7.2 % — ABNORMAL HIGH (ref 0.0–5.0)
HCT: 41.1 % (ref 36.0–46.0)
Hemoglobin: 13.1 g/dL (ref 12.0–15.0)
Lymphocytes Relative: 32.9 % (ref 12.0–46.0)
Lymphs Abs: 1.8 10*3/uL (ref 0.7–4.0)
MCHC: 31.9 g/dL (ref 30.0–36.0)
MCV: 87.5 fl (ref 78.0–100.0)
Monocytes Absolute: 0.6 10*3/uL (ref 0.1–1.0)
Monocytes Relative: 11.6 % (ref 3.0–12.0)
Neutro Abs: 2.6 10*3/uL (ref 1.4–7.7)
Neutrophils Relative %: 47.2 % (ref 43.0–77.0)
Platelets: 235 10*3/uL (ref 150.0–400.0)
RBC: 4.7 Mil/uL (ref 3.87–5.11)
RDW: 15.3 % (ref 11.5–15.5)
WBC: 5.6 10*3/uL (ref 4.0–10.5)

## 2019-09-29 LAB — HEPATIC FUNCTION PANEL
ALT: 9 U/L (ref 0–35)
AST: 16 U/L (ref 0–37)
Albumin: 4.4 g/dL (ref 3.5–5.2)
Alkaline Phosphatase: 51 U/L (ref 39–117)
Bilirubin, Direct: 0.2 mg/dL (ref 0.0–0.3)
Total Bilirubin: 0.7 mg/dL (ref 0.2–1.2)
Total Protein: 7.7 g/dL (ref 6.0–8.3)

## 2019-09-29 LAB — URINALYSIS, ROUTINE W REFLEX MICROSCOPIC
Bilirubin Urine: NEGATIVE
Ketones, ur: NEGATIVE
Leukocytes,Ua: NEGATIVE
Nitrite: NEGATIVE
Specific Gravity, Urine: 1.02 (ref 1.000–1.030)
Total Protein, Urine: NEGATIVE
Urine Glucose: NEGATIVE
Urobilinogen, UA: 0.2 (ref 0.0–1.0)
pH: 7 (ref 5.0–8.0)

## 2019-09-29 LAB — TSH: TSH: 2.42 u[IU]/mL (ref 0.35–4.50)

## 2019-09-29 LAB — BASIC METABOLIC PANEL
BUN: 16 mg/dL (ref 6–23)
CO2: 26 mEq/L (ref 19–32)
Calcium: 9.9 mg/dL (ref 8.4–10.5)
Chloride: 104 mEq/L (ref 96–112)
Creatinine, Ser: 1.07 mg/dL (ref 0.40–1.20)
GFR: 61.18 mL/min (ref >60.00)
Glucose, Bld: 97 mg/dL (ref 70–99)
Potassium: 4.3 mEq/L (ref 3.5–5.1)
Sodium: 141 mEq/L (ref 135–145)

## 2019-10-01 ENCOUNTER — Ambulatory Visit: Payer: Medicare Other | Admitting: Internal Medicine

## 2019-11-27 ENCOUNTER — Other Ambulatory Visit: Payer: Self-pay

## 2019-11-27 ENCOUNTER — Ambulatory Visit (INDEPENDENT_AMBULATORY_CARE_PROVIDER_SITE_OTHER): Payer: Medicare Other | Admitting: Internal Medicine

## 2019-11-27 ENCOUNTER — Encounter: Payer: Self-pay | Admitting: Internal Medicine

## 2019-11-27 DIAGNOSIS — I10 Essential (primary) hypertension: Secondary | ICD-10-CM | POA: Diagnosis not present

## 2019-11-27 DIAGNOSIS — E7800 Pure hypercholesterolemia, unspecified: Secondary | ICD-10-CM | POA: Diagnosis not present

## 2019-11-27 DIAGNOSIS — Z86010 Personal history of colonic polyps: Secondary | ICD-10-CM

## 2019-11-27 DIAGNOSIS — M79671 Pain in right foot: Secondary | ICD-10-CM | POA: Diagnosis not present

## 2019-11-27 DIAGNOSIS — E559 Vitamin D deficiency, unspecified: Secondary | ICD-10-CM | POA: Diagnosis not present

## 2019-11-27 DIAGNOSIS — D649 Anemia, unspecified: Secondary | ICD-10-CM | POA: Diagnosis not present

## 2019-11-27 DIAGNOSIS — Z0000 Encounter for general adult medical examination without abnormal findings: Secondary | ICD-10-CM

## 2019-11-27 MED ORDER — LISINOPRIL 10 MG PO TABS: 10.0000 mg | ORAL_TABLET | Freq: Every day | ORAL | 1 refills | Status: DC

## 2019-11-27 NOTE — Assessment & Plan Note (Signed)
Physical today 11/27/19.  Colonoscopy 12 2016 - three polyps and diverticulosis.  Recommended f/u colonoscopy in 5 years.  Mammogram 08/26/19 - Birads I.

## 2019-11-27 NOTE — Assessment & Plan Note (Signed)
Blood pressure as outlined.  Continue lisinopril.  Follow pressures.  Follow metabolic panel.   

## 2019-11-27 NOTE — Progress Notes (Signed)
Patient ID: Janice Floyd, female   DOB: Nov 03, 1950, 69 y.o.   MRN: [id number]   Subjective:    Patient ID: Janice Floyd, female    DOB: Aug 04, 1951, 69 y.o.   MRN: [id number]  HPI This visit occurred during the SARS-CoV-2 public health emergency.  Safety protocols were in place, including screening questions prior to the visit, additional usage of staff PPE, and extensive cleaning of exam room while observing appropriate contact time as indicated for disinfecting solutions.  Patient with past history of hypertension and hypercholesterolemia.  She comes in today to follow up on these issues as well as for a complete physical exam.  She reports she is doing well.  Feels good.  Stays active.  No chest pain or sob.  No acid reflux.  No abdominal pain.  Bowels moving.  Blood pressures doing well - averaging <130/80.  Is having some right heel pain.  Worse when first wakes up in am.  Once moves around - better.  Overall she feels she is doing well.     Past Medical History:  Diagnosis Date  . Anemia    iron deficient  . Depression   . Hyperlipemia   . Hypertension   . Vitamin D deficiency    Past Surgical History:  Procedure Laterality Date  . APPENDECTOMY    . COLONOSCOPY WITH PROPOFOL N/A 07/15/2015   Procedure: COLONOSCOPY WITH PROPOFOL;  Surgeon: Hulen Luster, MD;  Location: [facility] ENDOSCOPY;  Service: Gastroenterology;  Laterality: N/A;  . CYSTOSCOPY WITH BIOPSY N/A 04/16/2017   Procedure: CYSTOSCOPY WITH BLADDER NECK BIOPSY AND URETHRAL BIOPSY;  Surgeon: Hollice Espy, MD;  Location: [facility] ORS;  Service: Urology;  Laterality: N/A;  . MYOMECTOMY     x2  . UTERINE FIBROID SURGERY  1978   Family History  Problem Relation Age of Onset  . Lung cancer Father   . Heart disease Mother        congestive heart failure  . Hypertension Mother   . Lung cancer Brother   . Diabetes Brother   . Colon polyps Sister   . Breast cancer Neg Hx   . Prostate cancer Neg Hx   . Kidney cancer Neg Hx     Social History   Socioeconomic History  . Marital status: Married    Spouse name: Not on file  . Number of children: Not on file  . Years of education: Not on file  . Highest education level: Not on file  Occupational History  . Not on file  Tobacco Use  . Smoking status: Former Research scientist (life sciences)  . Smokeless tobacco: Never Used  . Tobacco comment: quit smoking 1990  Substance and Sexual Activity  . Alcohol use: Yes    Alcohol/week: 0.0 standard drinks    Comment: glass of wine per day  . Drug use: No  . Sexual activity: Not Currently  Other Topics Concern  . Not on file  Social History Narrative  . Not on file   Social Determinants of Health   Financial Resource Strain:   . Difficulty of Paying Living Expenses:   Food Insecurity:   . Worried About Charity fundraiser in the Last Year:   . Arboriculturist in the Last Year:   Transportation Needs:   . Film/video editor (Medical):   Marland Kitchen Lack of Transportation (Non-Medical):   Physical Activity:   . Days of Exercise per Week:   . Minutes of Exercise per Session:   Stress:   .  Feeling of Stress :   Social Connections:   . Frequency of Communication with Friends and Family:   . Frequency of Social Gatherings with Friends and Family:   . Attends Religious Services:   . Active Member of Clubs or Organizations:   . Attends Archivist Meetings:   Marland Kitchen Marital Status:     Outpatient Encounter Medications as of 11/27/2019  Medication Sig  . Cholecalciferol (VITAMIN D) 2000 units CAPS Take 2,000 Units by mouth daily.  . fluticasone (FLONASE) 50 MCG/ACT nasal spray Place 1 spray into both nostrils as needed for allergies or rhinitis. (Patient taking differently: Place 2 sprays into both nostrils as needed for allergies or rhinitis. )  . lisinopril (ZESTRIL) 10 MG tablet Take 1 tablet (10 mg total) by mouth daily.  . rosuvastatin (CRESTOR) 10 MG tablet TAKE 1 TABLET BY MOUTH EVERY DAY  . vitamin E 400 UNIT capsule Take 400  Units by mouth daily.  . [DISCONTINUED] lisinopril (ZESTRIL) 10 MG tablet TAKE 1 TABLET BY MOUTH EVERY DAY   No facility-administered encounter medications on file as of 11/27/2019.    Review of Systems  Constitutional: Negative for appetite change and unexpected weight change.  HENT: Negative for congestion and sinus pressure.   Eyes: Negative for pain and visual disturbance.  Respiratory: Negative for cough, chest tightness and shortness of breath.   Cardiovascular: Negative for chest pain, palpitations and leg swelling.  Gastrointestinal: Negative for abdominal pain, diarrhea, nausea and vomiting.  Genitourinary: Negative for difficulty urinating and dysuria.  Musculoskeletal: Negative for joint swelling and myalgias.       Right heel pain as outlined.    Skin: Negative for color change and rash.  Neurological: Negative for dizziness, light-headedness and headaches.  Hematological: Negative for adenopathy. Does not bruise/bleed easily.  Psychiatric/Behavioral: Negative for agitation and dysphoric mood.       Objective:    Physical Exam Vitals reviewed.  Constitutional:      General: She is not in acute distress.    Appearance: Normal appearance. She is well-developed.  HENT:     Head: Normocephalic and atraumatic.     Right Ear: External ear normal.  Eyes:     General: No scleral icterus.       Right eye: No discharge.        Left eye: No discharge.     Conjunctiva/sclera: Conjunctivae normal.  Neck:     Thyroid: No thyromegaly.  Cardiovascular:     Rate and Rhythm: Normal rate and regular rhythm.  Pulmonary:     Effort: No tachypnea, accessory muscle usage or respiratory distress.     Breath sounds: Normal breath sounds. No decreased breath sounds or wheezing.  Chest:     Breasts:        Right: No inverted nipple, mass, nipple discharge or tenderness (no axillary adenopathy).        Left: No inverted nipple, mass, nipple discharge or tenderness (no axilarry  adenopathy).  Abdominal:     General: Bowel sounds are normal.     Palpations: Abdomen is soft.     Tenderness: There is no abdominal tenderness.  Genitourinary:    Comments: Normal external genitalia.  Vaginal vault without lesions.  Cervix identified.  Pap smear performed.  Could not appreciate any adnexal masses or tenderness.   Musculoskeletal:        General: No tenderness.     Cervical back: Neck supple. No tenderness.     Comments: Right heel pain -  minimal to palpation.  No pain - achilles.  No increased swelling or erythema.  DP pulses palpable and equal bilaterally.   Lymphadenopathy:     Cervical: No cervical adenopathy.  Skin:    Findings: No erythema or rash.  Neurological:     Mental Status: She is alert and oriented to person, place, and time.  Psychiatric:        Mood and Affect: Mood normal.        Behavior: Behavior normal.    BP 122/78   Pulse 82   Temp (!) 96.9 F (36.1 C)   Resp 16   Ht 5\' 6"  (1.676 m)   Wt 200 lb (90.7 kg)   LMP 10/15/1999   SpO2 97%   BMI 32.28 kg/m  Wt Readings from Last 3 Encounters:  11/27/19 200 lb (90.7 kg)  07/22/19 196 lb (88.9 kg)  03/24/19 196 lb 12.8 oz (89.3 kg)     Lab Results  Component Value Date   WBC 5.6 09/29/2019   HGB 13.1 09/29/2019   HCT 41.1 09/29/2019   PLT 235.0 09/29/2019   GLUCOSE 97 09/29/2019   CHOL 161 09/29/2019   TRIG 38.0 09/29/2019   HDL 86.30 09/29/2019   LDLCALC 67 09/29/2019   ALT 9 09/29/2019   AST 16 09/29/2019   NA 141 09/29/2019   K 4.3 09/29/2019   CL 104 09/29/2019   CREATININE 1.07 09/29/2019   BUN 16 09/29/2019   CO2 26 09/29/2019   TSH 2.42 09/29/2019    MM 3D SCREEN BREAST BILATERAL  Result Date: 08/26/2019 CLINICAL DATA:  Screening. EXAM: DIGITAL SCREENING BILATERAL MAMMOGRAM WITH TOMO AND CAD COMPARISON:  Previous exam(s). ACR Breast Density Category b: There are scattered areas of fibroglandular density. FINDINGS: There are no findings suspicious for malignancy.  Images were processed with CAD. IMPRESSION: No mammographic evidence of malignancy. A result letter of this screening mammogram will be mailed directly to the patient. RECOMMENDATION: Screening mammogram in one year. (Code:SM-B-01Y) BI-RADS CATEGORY  1: Negative. Electronically Signed   By: Ammie Ferrier M.D.   On: 08/26/2019 14:43       Assessment & Plan:   Problem List Items Addressed This Visit    Anemia    Follow cbc.       Essential hypertension, benign    Blood pressure as outlined.  Continue lisinopril.  Follow pressures.  Follow metabolic panel.        Relevant Medications   lisinopril (ZESTRIL) 10 MG tablet   Other Relevant Orders   Basic metabolic panel   Health care maintenance    Physical today 11/27/19.  Colonoscopy 12 2016 - three polyps and diverticulosis.  Recommended f/u colonoscopy in 5 years.  Mammogram 08/26/19 - Birads I.        History of colonic polyps    Colonoscopy 07/2015.  Recommended f/u colonoscopy in 5 years.  Due this year.        Hypercholesterolemia    On crestor.  Low cholesterol diet and exercise.  Follow lipid panel and liver function tests.        Relevant Medications   lisinopril (ZESTRIL) 10 MG tablet   Other Relevant Orders   Hepatic function panel   Lipid panel   Pain of right heel    Exam as outlined.  Stretches.  Supports.  Follow.  Notify me if persistent.        Vitamin D deficiency    Follow vitamin D level.  Shakora Nordquist, MD 

## 2019-12-06 ENCOUNTER — Encounter: Payer: Self-pay | Admitting: Internal Medicine

## 2019-12-06 NOTE — Assessment & Plan Note (Signed)
Follow vitamin D level.  

## 2019-12-06 NOTE — Assessment & Plan Note (Signed)
Colonoscopy 07/2015.  Recommended f/u colonoscopy in 5 years.  Due this year.

## 2019-12-06 NOTE — Assessment & Plan Note (Signed)
Follow cbc.  

## 2019-12-06 NOTE — Assessment & Plan Note (Signed)
Exam as outlined.  Stretches.  Supports.  Follow.  Notify me if persistent.

## 2019-12-06 NOTE — Assessment & Plan Note (Signed)
On crestor.  Low cholesterol diet and exercise.  Follow lipid panel and liver function tests.   

## 2020-03-12 ENCOUNTER — Other Ambulatory Visit: Payer: Self-pay | Admitting: Internal Medicine

## 2020-04-06 ENCOUNTER — Other Ambulatory Visit (INDEPENDENT_AMBULATORY_CARE_PROVIDER_SITE_OTHER): Payer: Medicare Other

## 2020-04-06 ENCOUNTER — Other Ambulatory Visit: Payer: Self-pay

## 2020-04-06 DIAGNOSIS — I10 Essential (primary) hypertension: Secondary | ICD-10-CM

## 2020-04-06 DIAGNOSIS — E7800 Pure hypercholesterolemia, unspecified: Secondary | ICD-10-CM

## 2020-04-06 LAB — LIPID PANEL
Cholesterol: 139 mg/dL (ref 0–200)
HDL: 66.2 mg/dL (ref >39.00)
LDL Cholesterol: 65 mg/dL (ref 0–99)
NonHDL: 73.03
Total CHOL/HDL Ratio: 2
Triglycerides: 42 mg/dL (ref 0.0–149.0)
VLDL: 8.4 mg/dL (ref 0.0–40.0)

## 2020-04-06 LAB — HEPATIC FUNCTION PANEL
ALT: 8 U/L (ref 0–35)
AST: 14 U/L (ref 0–37)
Albumin: 4 g/dL (ref 3.5–5.2)
Alkaline Phosphatase: 53 U/L (ref 39–117)
Bilirubin, Direct: 0.2 mg/dL (ref 0.0–0.3)
Total Bilirubin: 0.8 mg/dL (ref 0.2–1.2)
Total Protein: 6.9 g/dL (ref 6.0–8.3)

## 2020-04-06 LAB — BASIC METABOLIC PANEL
BUN: 14 mg/dL (ref 6–23)
CO2: 31 mEq/L (ref 19–32)
Calcium: 9.4 mg/dL (ref 8.4–10.5)
Chloride: 103 mEq/L (ref 96–112)
Creatinine, Ser: 1.05 mg/dL (ref 0.40–1.20)
GFR: 62.44 mL/min (ref >60.00)
Glucose, Bld: 86 mg/dL (ref 70–99)
Potassium: 4.6 mEq/L (ref 3.5–5.1)
Sodium: 139 mEq/L (ref 135–145)

## 2020-04-08 ENCOUNTER — Other Ambulatory Visit: Payer: Self-pay

## 2020-04-08 ENCOUNTER — Encounter: Payer: Self-pay | Admitting: Internal Medicine

## 2020-04-08 ENCOUNTER — Ambulatory Visit (INDEPENDENT_AMBULATORY_CARE_PROVIDER_SITE_OTHER): Payer: Medicare Other | Admitting: Internal Medicine

## 2020-04-08 VITALS — BP 130/72 | HR 84 | Temp 97.6°F | Resp 16 | Ht 66.0 in | Wt 202.8 lb

## 2020-04-08 DIAGNOSIS — I10 Essential (primary) hypertension: Secondary | ICD-10-CM | POA: Diagnosis not present

## 2020-04-08 DIAGNOSIS — E7800 Pure hypercholesterolemia, unspecified: Secondary | ICD-10-CM

## 2020-04-08 DIAGNOSIS — Z23 Encounter for immunization: Secondary | ICD-10-CM | POA: Diagnosis not present

## 2020-04-08 DIAGNOSIS — D649 Anemia, unspecified: Secondary | ICD-10-CM

## 2020-04-08 DIAGNOSIS — E559 Vitamin D deficiency, unspecified: Secondary | ICD-10-CM | POA: Diagnosis not present

## 2020-04-08 NOTE — Progress Notes (Signed)
Patient ID: KALE DOLS, female   DOB: 28-Jan-1951, 69 y.o.   MRN: [id number]   Subjective:    Patient ID: SHASHA BUCHBINDER, female    DOB: 08-03-1951, 69 y.o.   MRN: [id number]  HPI This visit occurred during the SARS-CoV-2 public health emergency.  Safety protocols were in place, including screening questions prior to the visit, additional usage of staff PPE, and extensive cleaning of exam room while observing appropriate contact time as indicated for disinfecting solutions.  Patient here for a scheduled follow up.  She reports she is doing well.  Feels good.  Stays active.  No chest pain or sob.  No acid reflux or abdominal pain reported.  Bowels moving.  No problems.  Discussed colonoscopy due this year.  Handling stress.  Has had covid vaccine.  Discussed flu vaccine.  She is agreeable.   Past Medical History:  Diagnosis Date  . Anemia    iron deficient  . Depression   . Hyperlipemia   . Hypertension   . Vitamin D deficiency    Past Surgical History:  Procedure Laterality Date  . APPENDECTOMY    . COLONOSCOPY WITH PROPOFOL N/A 07/15/2015   Procedure: COLONOSCOPY WITH PROPOFOL;  Surgeon: Hulen Luster, MD;  Location: [facility] ENDOSCOPY;  Service: Gastroenterology;  Laterality: N/A;  . CYSTOSCOPY WITH BIOPSY N/A 04/16/2017   Procedure: CYSTOSCOPY WITH BLADDER NECK BIOPSY AND URETHRAL BIOPSY;  Surgeon: Hollice Espy, MD;  Location: [facility] ORS;  Service: Urology;  Laterality: N/A;  . MYOMECTOMY     x2  . UTERINE FIBROID SURGERY  1978   Family History  Problem Relation Age of Onset  . Lung cancer Father   . Heart disease Mother        congestive heart failure  . Hypertension Mother   . Lung cancer Brother   . Diabetes Brother   . Colon polyps Sister   . Breast cancer Neg Hx   . Prostate cancer Neg Hx   . Kidney cancer Neg Hx    Social History   Socioeconomic History  . Marital status: Married    Spouse name: Not on file  . Number of children: Not on file  . Years of education:  Not on file  . Highest education level: Not on file  Occupational History  . Not on file  Tobacco Use  . Smoking status: Former Research scientist (life sciences)  . Smokeless tobacco: Never Used  . Tobacco comment: quit smoking 1990  Vaping Use  . Vaping Use: Never used  Substance and Sexual Activity  . Alcohol use: Yes    Alcohol/week: 0.0 standard drinks    Comment: glass of wine per day  . Drug use: No  . Sexual activity: Not Currently  Other Topics Concern  . Not on file  Social History Narrative  . Not on file   Social Determinants of Health   Financial Resource Strain:   . Difficulty of Paying Living Expenses: Not on file  Food Insecurity:   . Worried About Charity fundraiser in the Last Year: Not on file  . Ran Out of Food in the Last Year: Not on file  Transportation Needs:   . Lack of Transportation (Medical): Not on file  . Lack of Transportation (Non-Medical): Not on file  Physical Activity:   . Days of Exercise per Week: Not on file  . Minutes of Exercise per Session: Not on file  Stress:   . Feeling of Stress : Not on file  Social  Connections:   . Frequency of Communication with Friends and Family: Not on file  . Frequency of Social Gatherings with Friends and Family: Not on file  . Attends Religious Services: Not on file  . Active Member of Clubs or Organizations: Not on file  . Attends Archivist Meetings: Not on file  . Marital Status: Not on file    Outpatient Encounter Medications as of 04/08/2020  Medication Sig  . Cholecalciferol (VITAMIN D) 2000 units CAPS Take 2,000 Units by mouth daily.  . fluticasone (FLONASE) 50 MCG/ACT nasal spray Place 1 spray into both nostrils as needed for allergies or rhinitis. (Patient taking differently: Place 2 sprays into both nostrils as needed for allergies or rhinitis. )  . lisinopril (ZESTRIL) 10 MG tablet Take 1 tablet (10 mg total) by mouth daily.  . rosuvastatin (CRESTOR) 10 MG tablet TAKE 1 TABLET BY MOUTH EVERY DAY  .  vitamin E 400 UNIT capsule Take 400 Units by mouth daily.   No facility-administered encounter medications on file as of 04/08/2020.    Review of Systems  Constitutional: Negative for appetite change and unexpected weight change.  HENT: Negative for congestion and sinus pressure.   Respiratory: Negative for cough, chest tightness and shortness of breath.   Cardiovascular: Negative for chest pain, palpitations and leg swelling.  Gastrointestinal: Negative for abdominal pain, diarrhea, nausea and vomiting.  Genitourinary: Negative for difficulty urinating and dysuria.  Musculoskeletal: Negative for joint swelling and myalgias.  Skin: Negative for color change and rash.  Neurological: Negative for dizziness, light-headedness and headaches.  Psychiatric/Behavioral: Negative for agitation and dysphoric mood.       Objective:    Physical Exam Vitals and nursing note reviewed.  Constitutional:      General: She is not in acute distress.    Appearance: Normal appearance.  HENT:     Head: Normocephalic and atraumatic.     Right Ear: External ear normal.     Left Ear: External ear normal.  Eyes:     General: No scleral icterus.       Right eye: No discharge.        Left eye: No discharge.     Conjunctiva/sclera: Conjunctivae normal.  Neck:     Thyroid: No thyromegaly.  Cardiovascular:     Rate and Rhythm: Normal rate and regular rhythm.  Pulmonary:     Effort: No respiratory distress.     Breath sounds: Normal breath sounds. No wheezing.  Abdominal:     General: Bowel sounds are normal.     Palpations: Abdomen is soft.     Tenderness: There is no abdominal tenderness.  Musculoskeletal:        General: No swelling or tenderness.     Cervical back: Neck supple. No tenderness.  Lymphadenopathy:     Cervical: No cervical adenopathy.  Skin:    Findings: No erythema or rash.  Neurological:     Mental Status: She is alert.  Psychiatric:        Mood and Affect: Mood normal.         Behavior: Behavior normal.     BP 130/72   Pulse 84   Temp 97.6 F (36.4 C)   Resp 16   Ht 5\' 6"  (1.676 m)   Wt 202 lb 12.8 oz (92 kg)   LMP 10/15/1999   SpO2 98%   BMI 32.73 kg/m  Wt Readings from Last 3 Encounters:  04/08/20 202 lb 12.8 oz (92 kg)  11/27/19  200 lb (90.7 kg)  07/22/19 196 lb (88.9 kg)     Lab Results  Component Value Date   WBC 5.6 09/29/2019   HGB 13.1 09/29/2019   HCT 41.1 09/29/2019   PLT 235.0 09/29/2019   GLUCOSE 86 04/06/2020   CHOL 139 04/06/2020   TRIG 42.0 04/06/2020   HDL 66.20 04/06/2020   LDLCALC 65 04/06/2020   ALT 8 04/06/2020   AST 14 04/06/2020   NA 139 04/06/2020   K 4.6 04/06/2020   CL 103 04/06/2020   CREATININE 1.05 04/06/2020   BUN 14 04/06/2020   CO2 31 04/06/2020   TSH 2.42 09/29/2019    MM 3D SCREEN BREAST BILATERAL  Result Date: 08/26/2019 CLINICAL DATA:  Screening. EXAM: DIGITAL SCREENING BILATERAL MAMMOGRAM WITH TOMO AND CAD COMPARISON:  Previous exam(s). ACR Breast Density Category b: There are scattered areas of fibroglandular density. FINDINGS: There are no findings suspicious for malignancy. Images were processed with CAD. IMPRESSION: No mammographic evidence of malignancy. A result letter of this screening mammogram will be mailed directly to the patient. RECOMMENDATION: Screening mammogram in one year. (Code:SM-B-01Y) BI-RADS CATEGORY  1: Negative. Electronically Signed   By: Ammie Ferrier M.D.   On: 08/26/2019 14:43       Assessment & Plan:   Problem List Items Addressed This Visit    Vitamin D deficiency    Follow vitamin D level.       Relevant Orders   VITAMIN D 25 Hydroxy (Vit-D Deficiency, Fractures)   Hypercholesterolemia    On crestor.  Low cholesterol diet and exercise.  Follow lipid panel.   Lab Results  Component Value Date   CHOL 139 04/06/2020   HDL 66.20 04/06/2020   LDLCALC 65 04/06/2020   TRIG 42.0 04/06/2020   CHOLHDL 2 04/06/2020        Relevant Orders   Hepatic function  panel   Lipid panel   Essential hypertension, benign    Blood pressure doing well.  Continue lisinopril.  Follow pressures.  Follow metabolic panel.       Relevant Orders   Basic metabolic panel   Anemia    Follow cbc.        Other Visit Diagnoses    Need for immunization against influenza    -  Primary   Relevant Orders   Flu Vaccine QUAD High Dose(Fluad) (Completed)       Einar Pheasant, MD

## 2020-04-18 ENCOUNTER — Encounter: Payer: Self-pay | Admitting: Internal Medicine

## 2020-04-18 NOTE — Assessment & Plan Note (Signed)
On crestor.  Low cholesterol diet and exercise.  Follow lipid panel.   Lab Results  Component Value Date   CHOL 139 04/06/2020   HDL 66.20 04/06/2020   LDLCALC 65 04/06/2020   TRIG 42.0 04/06/2020   CHOLHDL 2 04/06/2020

## 2020-04-18 NOTE — Assessment & Plan Note (Signed)
Blood pressure doing well.  Continue lisinopril.  Follow pressures.  Follow metabolic panel.

## 2020-04-18 NOTE — Assessment & Plan Note (Signed)
Follow cbc.  

## 2020-04-18 NOTE — Assessment & Plan Note (Signed)
Follow vitamin D level.  

## 2020-05-13 DIAGNOSIS — Z23 Encounter for immunization: Secondary | ICD-10-CM | POA: Diagnosis not present

## 2020-06-14 ENCOUNTER — Other Ambulatory Visit: Payer: Self-pay | Admitting: Internal Medicine

## 2020-07-19 ENCOUNTER — Other Ambulatory Visit: Payer: Self-pay | Admitting: Internal Medicine

## 2020-07-19 DIAGNOSIS — Z1231 Encounter for screening mammogram for malignant neoplasm of breast: Secondary | ICD-10-CM

## 2020-07-23 ENCOUNTER — Ambulatory Visit (INDEPENDENT_AMBULATORY_CARE_PROVIDER_SITE_OTHER): Payer: Medicare Other

## 2020-07-23 VITALS — Ht 66.0 in | Wt 202.0 lb

## 2020-07-23 DIAGNOSIS — Z0000 Encounter for general adult medical examination without abnormal findings: Secondary | ICD-10-CM | POA: Diagnosis not present

## 2020-07-23 NOTE — Patient Instructions (Addendum)
Ms. Janice Floyd , Thank you for taking time to come for your Medicare Wellness Visit. I appreciate your ongoing commitment to your health goals. Please review the following plan we discussed and let me know if I can assist you in the future.   These are the goals we discussed: Goals      Patient Stated   .  I want to lose 10lbs (pt-stated)      Healthy diet Stay active       This is a list of the screening recommended for you and due dates:  Health Maintenance  Topic Date Due  . Tetanus Vaccine  08/08/2019  . Colon Cancer Screening  07/14/2020  . Mammogram  08/25/2020  . DEXA scan (bone density measurement)  Completed  . COVID-19 Vaccine  Completed  .  Hepatitis C: One time screening is recommended by Center for Disease Control  (CDC) for  adults born from [birth year] through 1967.   Completed  . Pneumonia vaccines  Completed  . Flu Shot  Discontinued    Immunizations Immunization History  Administered Date(s) Administered  . Fluad Quad(high Dose 65+) 04/08/2020  . Influenza, High Dose Seasonal PF 05/10/2016, 06/05/2017, 05/30/2018, 06/20/2019  . Influenza,inj,Quad PF,6+ Mos 06/29/2014, 05/03/2015  . Pneumococcal Conjugate-13 07/21/2014  . Pneumococcal Polysaccharide-23 07/13/2016  . Pneumococcal-Unspecified 08/07/2010  . Zoster 02/23/2014  . Zoster Recombinat (Shingrix) 05/04/2018, 09/03/2018   Keep all routine maintenance appointments.   Next scheduled fasting lab 08/05/20 @ 8:00  Follow up 08/10/19 @ 10:30  Advanced directives: End of life planning; Advance aging; Advanced directives discussed.  Copy of current HCPOA/Living Will requested.    Conditions/risks identified: None new.  Follow up in one year for your annual wellness visit.   Preventive Care 64 Years and Older, Female Preventive care refers to lifestyle choices and visits with your health care provider that can promote health and wellness. What does preventive care include?  A yearly physical exam. This is also  called an annual well check.  Dental exams once or twice a year.  Routine eye exams. Ask your health care provider how often you should have your eyes checked.  Personal lifestyle choices, including:  Daily care of your teeth and gums.  Regular physical activity.  Eating a healthy diet.  Avoiding tobacco and drug use.  Limiting alcohol use.  Practicing safe sex.  Taking low-dose aspirin every day.  Taking vitamin and mineral supplements as recommended by your health care provider. What happens during an annual well check? The services and screenings done by your health care provider during your annual well check will depend on your age, overall health, lifestyle risk factors, and family history of disease. Counseling  Your health care provider may ask you questions about your:  Alcohol use.  Tobacco use.  Drug use.  Emotional well-being.  Home and relationship well-being.  Sexual activity.  Eating habits.  History of falls.  Memory and ability to understand (cognition).  Work and work Statistician.  Reproductive health. Screening  You may have the following tests or measurements:  Height, weight, and BMI.  Blood pressure.  Lipid and cholesterol levels. These may be checked every 5 years, or more frequently if you are over 13 years old.  Skin check.  Lung cancer screening. You may have this screening every year starting at age 1 if you have a 30-pack-year history of smoking and currently smoke or have quit within the past 15 years.  Fecal occult blood test (FOBT) of the stool.  You may have this test every year starting at age 23.  Flexible sigmoidoscopy or colonoscopy. You may have a sigmoidoscopy every 5 years or a colonoscopy every 10 years starting at age 24.  Hepatitis C blood test.  Hepatitis B blood test.  Sexually transmitted disease (STD) testing.  Diabetes screening. This is done by checking your blood sugar (glucose) after you have not  eaten for a while (fasting). You may have this done every 1-3 years.  Bone density scan. This is done to screen for osteoporosis. You may have this done starting at age 76.  Mammogram. This may be done every 1-2 years. Talk to your health care provider about how often you should have regular mammograms. Talk with your health care provider about your test results, treatment options, and if necessary, the need for more tests. Vaccines  Your health care provider may recommend certain vaccines, such as:  Influenza vaccine. This is recommended every year.  Tetanus, diphtheria, and acellular pertussis (Tdap, Td) vaccine. You may need a Td booster every 10 years.  Zoster vaccine. You may need this after age 32.  Pneumococcal 13-valent conjugate (PCV13) vaccine. One dose is recommended after age 77.  Pneumococcal polysaccharide (PPSV23) vaccine. One dose is recommended after age 59. Talk to your health care provider about which screenings and vaccines you need and how often you need them. This information is not intended to replace advice given to you by your health care provider. Make sure you discuss any questions you have with your health care provider. Document Released: 08/20/2015 Document Revised: 04/12/2016 Document Reviewed: 05/25/2015 Elsevier Interactive Patient Education  2017 [employer] Prevention in the Home Falls can cause injuries. They can happen to people of all ages. There are many things you can do to make your home safe and to help prevent falls. What can I do on the outside of my home?  Regularly fix the edges of walkways and driveways and fix any cracks.  Remove anything that might make you trip as you walk through a door, such as a raised step or threshold.  Trim any bushes or trees on the path to your home.  Use bright outdoor lighting.  Clear any walking paths of anything that might make someone trip, such as rocks or tools.  Regularly check to see if  handrails are loose or broken. Make sure that both sides of any steps have handrails.  Any raised decks and porches should have guardrails on the edges.  Have any leaves, snow, or ice cleared regularly.  Use sand or salt on walking paths during winter.  Clean up any spills in your garage right away. This includes oil or grease spills. What can I do in the bathroom?  Use night lights.  Install grab bars by the toilet and in the tub and shower. Do not use towel bars as grab bars.  Use non-skid mats or decals in the tub or shower.  If you need to sit down in the shower, use a plastic, non-slip stool.  Keep the floor dry. Clean up any water that spills on the floor as soon as it happens.  Remove soap buildup in the tub or shower regularly.  Attach bath mats securely with double-sided non-slip rug tape.  Do not have throw rugs and other things on the floor that can make you trip. What can I do in the bedroom?  Use night lights.  Make sure that you have a light by your bed that  is easy to reach.  Do not use any sheets or blankets that are too big for your bed. They should not hang down onto the floor.  Have a firm chair that has side arms. You can use this for support while you get dressed.  Do not have throw rugs and other things on the floor that can make you trip. What can I do in the kitchen?  Clean up any spills right away.  Avoid walking on wet floors.  Keep items that you use a lot in easy-to-reach places.  If you need to reach something above you, use a strong step stool that has a grab bar.  Keep electrical cords out of the way.  Do not use floor polish or wax that makes floors slippery. If you must use wax, use non-skid floor wax.  Do not have throw rugs and other things on the floor that can make you trip. What can I do with my stairs?  Do not leave any items on the stairs.  Make sure that there are handrails on both sides of the stairs and use them. Fix  handrails that are broken or loose. Make sure that handrails are as long as the stairways.  Check any carpeting to make sure that it is firmly attached to the stairs. Fix any carpet that is loose or worn.  Avoid having throw rugs at the top or bottom of the stairs. If you do have throw rugs, attach them to the floor with carpet tape.  Make sure that you have a light switch at the top of the stairs and the bottom of the stairs. If you do not have them, ask someone to add them for you. What else can I do to help prevent falls?  Wear shoes that:  Do not have high heels.  Have rubber bottoms.  Are comfortable and fit you well.  Are closed at the toe. Do not wear sandals.  If you use a stepladder:  Make sure that it is fully opened. Do not climb a closed stepladder.  Make sure that both sides of the stepladder are locked into place.  Ask someone to hold it for you, if possible.  Clearly mark and make sure that you can see:  Any grab bars or handrails.  First and last steps.  Where the edge of each step is.  Use tools that help you move around (mobility aids) if they are needed. These include:  Canes.  Walkers.  Scooters.  Crutches.  Turn on the lights when you go into a dark area. Replace any light bulbs as soon as they burn out.  Set up your furniture so you have a clear path. Avoid moving your furniture around.  If any of your floors are uneven, fix them.  If there are any pets around you, be aware of where they are.  Review your medicines with your doctor. Some medicines can make you feel dizzy. This can increase your chance of falling. Ask your doctor what other things that you can do to help prevent falls. This information is not intended to replace advice given to you by your health care provider. Make sure you discuss any questions you have with your health care provider. Document Released: 05/20/2009 Document Revised: 12/30/2015 Document Reviewed:  08/28/2014 Elsevier Interactive Patient Education  2017 [employer].

## 2020-07-23 NOTE — Progress Notes (Addendum)
Subjective:   Janice Floyd is a 69 y.o. female who presents for Medicare Annual (Subsequent) preventive examination.  Review of Systems    No ROS.  Medicare Wellness Virtual Visit.    Cardiac Risk Factors include: advanced age (>67men, >47 women);hypertension     Objective:    Today's Vitals   07/23/20 1138  Weight: 202 lb (91.6 kg)  Height: 5\' 6"  (1.676 m)   Body mass index is 32.6 kg/m.  Advanced Directives 07/23/2020 07/23/2019 07/16/2018 07/13/2017 04/16/2017 04/13/2017 07/13/2016  Does Patient Have a Medical Advance Directive? Yes No No No No No No  Type of Paramedic of [place];Living will - - - - - -  Does patient want to make changes to medical advance directive? No - Patient declined No - Patient declined - - - - -  Copy of [place] in Chart? No - copy requested - - - - - -  Would patient like information on creating a medical advance directive? - - No - Patient declined Yes (MAU/Ambulatory/Procedural Areas - Information given) No - Patient declined No - Patient declined Yes (MAU/Ambulatory/Procedural Areas - Information given)    Current Medications (verified) Outpatient Encounter Medications as of 07/23/2020  Medication Sig  . Cholecalciferol (VITAMIN D) 2000 units CAPS Take 2,000 Units by mouth daily.  . fluticasone (FLONASE) 50 MCG/ACT nasal spray Place 1 spray into both nostrils as needed for allergies or rhinitis. (Patient taking differently: Place 2 sprays into both nostrils as needed for allergies or rhinitis. )  . lisinopril (ZESTRIL) 10 MG tablet TAKE 1 TABLET BY MOUTH EVERY DAY  . rosuvastatin (CRESTOR) 10 MG tablet TAKE 1 TABLET BY MOUTH EVERY DAY  . vitamin E 400 UNIT capsule Take 400 Units by mouth daily.   No facility-administered encounter medications on file as of 07/23/2020.    Allergies (verified) Patient has no known allergies.   History: Past Medical History:  Diagnosis Date  . Anemia    iron  deficient  . Depression   . Hyperlipemia   . Hypertension   . Vitamin D deficiency    Past Surgical History:  Procedure Laterality Date  . APPENDECTOMY    . COLONOSCOPY WITH PROPOFOL N/A 07/15/2015   Procedure: COLONOSCOPY WITH PROPOFOL;  Surgeon: Hulen Luster, MD;  Location: [facility] ENDOSCOPY;  Service: Gastroenterology;  Laterality: N/A;  . CYSTOSCOPY WITH BIOPSY N/A 04/16/2017   Procedure: CYSTOSCOPY WITH BLADDER NECK BIOPSY AND URETHRAL BIOPSY;  Surgeon: Hollice Espy, MD;  Location: [facility] ORS;  Service: Urology;  Laterality: N/A;  . MYOMECTOMY     x2  . UTERINE FIBROID SURGERY  1978   Family History  Problem Relation Age of Onset  . Lung cancer Father   . Heart disease Mother        congestive heart failure  . Hypertension Mother   . Lung cancer Brother   . Diabetes Brother   . Colon polyps Sister   . Breast cancer Neg Hx   . Prostate cancer Neg Hx   . Kidney cancer Neg Hx    Social History   Socioeconomic History  . Marital status: Married    Spouse name: Not on file  . Number of children: Not on file  . Years of education: Not on file  . Highest education level: Not on file  Occupational History  . Not on file  Tobacco Use  . Smoking status: Former Research scientist (life sciences)  . Smokeless tobacco: Never Used  . Tobacco  comment: quit smoking 1990  Vaping Use  . Vaping Use: Never used  Substance and Sexual Activity  . Alcohol use: Yes    Alcohol/week: 0.0 standard drinks    Comment: glass of wine per day  . Drug use: No  . Sexual activity: Not Currently  Other Topics Concern  . Not on file  Social History Narrative  . Not on file   Social Determinants of Health   Financial Resource Strain: Low Risk   . Difficulty of Paying Living Expenses: Not hard at all  Food Insecurity: No Food Insecurity  . Worried About Charity fundraiser in the Last Year: Never true  . Ran Out of Food in the Last Year: Never true  Transportation Needs: No Transportation Needs  . Lack of Transportation  (Medical): No  . Lack of Transportation (Non-Medical): No  Physical Activity: Sufficiently Active  . Days of Exercise per Week: 6 days  . Minutes of Exercise per Session: 60 min  Stress: No Stress Concern Present  . Feeling of Stress : Not at all  Social Connections: Unknown  . Frequency of Communication with Friends and Family: Not on file  . Frequency of Social Gatherings with Friends and Family: Not on file  . Attends Religious Services: Not on file  . Active Member of Clubs or Organizations: Not on file  . Attends Archivist Meetings: Not on file  . Marital Status: Married    Tobacco Counseling Counseling given: Not Answered Comment: quit smoking 1990   Clinical Intake:  Pre-visit preparation completed: Yes        Diabetes: No  How often do you need to have someone help you when you read instructions, pamphlets, or other written materials from your doctor or pharmacy?: 1 - Never  Interpreter Needed?: No      Activities of Daily Living In your present state of health, do you have any difficulty performing the following activities: 07/23/2020  Hearing? N  Vision? N  Difficulty concentrating or making decisions? N  Walking or climbing stairs? N  Dressing or bathing? N  Doing errands, shopping? N  Preparing Food and eating ? N  Using the Toilet? N  In the past six months, have you accidently leaked urine? N  Do you have problems with loss of bowel control? N  Managing your Medications? N  Managing your Finances? N  Housekeeping or managing your Housekeeping? N  Some recent data might be hidden    Patient Care Team: Einar Pheasant, MD as PCP - General (Internal Medicine)  Indicate any recent Medical Services you may have received from other than [facility] providers in the past year (date may be approximate).     Assessment:   This is a routine wellness examination for Janice Floyd.  I connected with Shephanie today by video without sound. Nurse phoned  patient and verified that I am speaking with the correct person using two identifiers. Location patient: home Location provider: work Persons participating in the virtual visit: patient, Marine scientist.    I discussed the limitations, risks, security and privacy concerns of performing an evaluation and management service by telephone and the availability of in person appointments. The patient expressed understanding and verbally consented to this telephonic visit.    Interactive audio and video telecommunications were attempted between this provider and patient, however failed, due to patient having technical difficulties OR patient did not have access to video capability.  We continued and completed visit with audio only.  Some vital  signs may be absent or patient reported.   Hearing/Vision screen  Hearing Screening   125Hz  250Hz  500Hz  1000Hz  2000Hz  3000Hz  4000Hz  6000Hz  8000Hz   Right ear:           Left ear:           Comments: Patient is able to hear conversational tones without difficulty.  No issues reported.   Vision Screening Comments: Wears corrective lenses  Visual acuity not assessed per patient preference since they have regular follow up with the ophthalmologist  Dietary issues and exercise activities discussed: Current Exercise Habits: Home exercise routine, Type of exercise: walking;yoga (Zumba, Drums), Intensity: Mild  Healthy diet Good water intake  Goals      Patient Stated   .  I want to lose 10lbs (pt-stated)      Healthy diet Stay active      Depression Screen PHQ 2/9 Scores 07/23/2020 07/23/2019 03/24/2019 07/16/2018 08/09/2017 07/13/2017 01/17/2017  PHQ - 2 Score 0 0 0 0 0 0 0  PHQ- 9 Score - - 3 - - - -    Fall Risk Fall Risk  07/23/2020 07/23/2019 07/16/2018 08/09/2017 07/13/2017  Falls in the past year? 0 0 0 No No  Number falls in past yr: 0 - - - -  Injury with Fall? 0 - - - -  Follow up Falls evaluation completed Falls prevention discussed - - -    FALL RISK  PREVENTION PERTAINING TO THE HOME: Handrails in use when climbing stairs? Yes Home free of loose throw rugs in walkways, pet beds, electrical cords, etc? Yes  Adequate lighting in your home to reduce risk of falls? Yes   ASSISTIVE DEVICES UTILIZED TO PREVENT FALLS: Use of a cane, walker or w/c? No   TIMED UP AND GO:  Was the test performed? No . Virtual visit.   Cognitive Function: Patient is alert and oriented x3.  Denies difficulty focusing, making decisions, memory loss. Enjoys reading and Public house manager. MMSE/6CIT deferred. Normal by direct communication/observation.  MMSE - Mini Mental State Exam 07/16/2018 07/13/2017  Orientation to time 5 5  Orientation to Place 5 5  Registration 3 3  Attention/ Calculation 5 5  Recall 3 3  Language- name 2 objects 2 2  Language- repeat 1 1  Language- follow 3 step command 3 3  Language- read & follow direction 1 1  Write a sentence 1 1  Copy design 1 1  Total score 30 30     6CIT Screen 07/23/2019 07/13/2016  What Year? 0 points 0 points  What month? 0 points 0 points  What time? 0 points 0 points  Count back from 20 0 points 0 points  Months in reverse 0 points 0 points  Repeat phrase 0 points 0 points  Total Score 0 0    Immunizations Immunization History  Administered Date(s) Administered  . Fluad Quad(high Dose 65+) 04/08/2020  . Influenza, High Dose Seasonal PF 05/10/2016, 06/05/2017, 05/30/2018, 06/20/2019  . Influenza,inj,Quad PF,6+ Mos 06/29/2014, 05/03/2015  . PFIZER SARS-COV-2 Vaccination 09/26/2019, 10/17/2019, 05/07/2020  . Pneumococcal Conjugate-13 07/21/2014  . Pneumococcal Polysaccharide-23 07/13/2016  . Pneumococcal-Unspecified 08/07/2010  . Zoster 02/23/2014  . Zoster Recombinat (Shingrix) 05/04/2018, 09/03/2018    TDAP status: Due, Education has been provided regarding the importance of this vaccine. Advised may receive this vaccine at local pharmacy or Health Dept. Aware to provide a copy of the  vaccination record if obtained from local pharmacy or Health Dept. Verbalized acceptance and understanding. Deferred.  Health Maintenance Health Maintenance  Topic Date Due  . TETANUS/TDAP  08/08/2019  . COLONOSCOPY  07/14/2020  . MAMMOGRAM  08/25/2020  . DEXA SCAN  Completed  . COVID-19 Vaccine  Completed  . Hepatitis C Screening  Completed  . PNA vac Low Risk Adult  Completed  . INFLUENZA VACCINE  Discontinued   Colorectal cancer screening: Type of screening: Colonoscopy. Completed 07/15/15. Repeat every 5 years. Deferred for follow up with PCP per patient preference.  Mammogram status: Completed 08/26/19. Repeat every year. 3D Screen, Bilateral.  Bone Density- 10/01/18. Osteopenia. Repeat every 2 years. Cholecalciferol (VITAMIN D) 2000 units CAPS.   Lung Cancer Screening: (Low Dose CT Chest recommended if Age 40-80 years, 30 pack-year currently smoking OR have quit w/in 15years.) does not qualify.   Hepatitis C Screening: Completed 07/13/17.  Vision Screening: Recommended annual ophthalmology exams for early detection of glaucoma and other disorders of the eye. Is the patient up to date with their annual eye exam?  Yes   Dental Screening: Recommended annual dental exams for proper oral hygiene.  Community Resource Referral / Chronic Care Management: CRR required this visit?  No   CCM required this visit?  No      Plan:   Keep all routine maintenance appointments.   Next scheduled fasting lab 08/05/20 @ 8:00  Follow up 08/10/19 @ 10:30  I have personally reviewed and noted the following in the patient's chart:   . Medical and social history . Use of alcohol, tobacco or illicit drugs  . Current medications and supplements . Functional ability and status . Nutritional status . Physical activity . Advanced directives . List of other physicians . Hospitalizations, surgeries, and ER visits in previous 12 months . Vitals . Screenings to include cognitive, depression, and  falls . Referrals and appointments  In addition, I have reviewed and discussed with patient certain preventive protocols, quality metrics, and best practice recommendations. A written personalized care plan for preventive services as well as general preventive health recommendations were provided to patient via mail.     Varney Biles, LPN   [id number]

## 2020-08-04 ENCOUNTER — Other Ambulatory Visit: Payer: Self-pay

## 2020-08-05 ENCOUNTER — Other Ambulatory Visit (INDEPENDENT_AMBULATORY_CARE_PROVIDER_SITE_OTHER): Payer: Medicare Other

## 2020-08-05 ENCOUNTER — Other Ambulatory Visit: Payer: Self-pay

## 2020-08-05 DIAGNOSIS — I10 Essential (primary) hypertension: Secondary | ICD-10-CM | POA: Diagnosis not present

## 2020-08-05 DIAGNOSIS — E559 Vitamin D deficiency, unspecified: Secondary | ICD-10-CM | POA: Diagnosis not present

## 2020-08-05 DIAGNOSIS — E7800 Pure hypercholesterolemia, unspecified: Secondary | ICD-10-CM | POA: Diagnosis not present

## 2020-08-05 LAB — HEPATIC FUNCTION PANEL
ALT: 9 U/L (ref 0–35)
AST: 14 U/L (ref 0–37)
Albumin: 4 g/dL (ref 3.5–5.2)
Alkaline Phosphatase: 45 U/L (ref 39–117)
Bilirubin, Direct: 0.2 mg/dL (ref 0.0–0.3)
Total Bilirubin: 0.6 mg/dL (ref 0.2–1.2)
Total Protein: 7.1 g/dL (ref 6.0–8.3)

## 2020-08-05 LAB — LIPID PANEL
Cholesterol: 136 mg/dL (ref 0–200)
HDL: 68.6 mg/dL (ref >39.00)
LDL Cholesterol: 61 mg/dL (ref 0–99)
NonHDL: 67.61
Total CHOL/HDL Ratio: 2
Triglycerides: 35 mg/dL (ref 0.0–149.0)
VLDL: 7 mg/dL (ref 0.0–40.0)

## 2020-08-05 LAB — BASIC METABOLIC PANEL
BUN: 15 mg/dL (ref 6–23)
CO2: 29 mEq/L (ref 19–32)
Calcium: 9.3 mg/dL (ref 8.4–10.5)
Chloride: 105 mEq/L (ref 96–112)
Creatinine, Ser: 0.95 mg/dL (ref 0.40–1.20)
GFR: 60.17 mL/min (ref >60.00)
Glucose, Bld: 86 mg/dL (ref 70–99)
Potassium: 4.8 mEq/L (ref 3.5–5.1)
Sodium: 139 mEq/L (ref 135–145)

## 2020-08-05 LAB — VITAMIN D 25 HYDROXY (VIT D DEFICIENCY, FRACTURES): VITD: 61.84 ng/mL (ref 30.00–100.00)

## 2020-08-09 ENCOUNTER — Other Ambulatory Visit: Payer: Self-pay

## 2020-08-09 ENCOUNTER — Encounter: Payer: Self-pay | Admitting: Internal Medicine

## 2020-08-09 ENCOUNTER — Ambulatory Visit (INDEPENDENT_AMBULATORY_CARE_PROVIDER_SITE_OTHER): Payer: Medicare Other | Admitting: Internal Medicine

## 2020-08-09 VITALS — BP 132/78 | HR 88 | Temp 97.9°F | Resp 16 | Ht 66.0 in | Wt 203.8 lb

## 2020-08-09 DIAGNOSIS — E7800 Pure hypercholesterolemia, unspecified: Secondary | ICD-10-CM

## 2020-08-09 DIAGNOSIS — F439 Reaction to severe stress, unspecified: Secondary | ICD-10-CM | POA: Diagnosis not present

## 2020-08-09 DIAGNOSIS — Z86010 Personal history of colonic polyps: Secondary | ICD-10-CM | POA: Diagnosis not present

## 2020-08-09 DIAGNOSIS — Z1211 Encounter for screening for malignant neoplasm of colon: Secondary | ICD-10-CM | POA: Diagnosis not present

## 2020-08-09 DIAGNOSIS — D649 Anemia, unspecified: Secondary | ICD-10-CM | POA: Diagnosis not present

## 2020-08-09 DIAGNOSIS — I10 Essential (primary) hypertension: Secondary | ICD-10-CM

## 2020-08-09 NOTE — Progress Notes (Unsigned)
Patient ID: Janice Floyd, female   DOB: [birth year], 70 y.o.   MRN: [id number]   Subjective:    Patient ID: Janice Floyd, female    DOB: 1950-11-26, 70 y.o.   MRN: [id number]  HPI This visit occurred during the SARS-CoV-2 public health emergency.  Safety protocols were in place, including screening questions prior to the visit, additional usage of staff PPE, and extensive cleaning of exam room while observing appropriate contact time as indicated for disinfecting solutions.  Patient here for a scheduled follow up.  Here to follow up regarding her blood pressure and cholesterol.  She is doing well.  Feels good.  Trying to stay active.  No chest pain or sob.  No acid reflux or abdominal pain reported.  Bowels moving. Blood pressures averaging upper 120s-low 130/78-80.  Has mammogram scheduled.   Past Medical History:  Diagnosis Date  . Anemia    iron deficient  . Depression   . Hyperlipemia   . Hypertension   . Vitamin D deficiency    Past Surgical History:  Procedure Laterality Date  . APPENDECTOMY    . COLONOSCOPY WITH PROPOFOL N/A 07/15/2015   Procedure: COLONOSCOPY WITH PROPOFOL;  Surgeon: Wallace Cullens, MD;  Location[facility] ENDOSCOPY;  Service: Gastroenterology;  Laterality: N/A;  . CYSTOSCOPY WITH BIOPSY N/A 04/16/2017   Procedure: CYSTOSCOPY WITH BLADDER NECK BIOPSY AND URETHRAL BIOPSY;  Surgeon: Vanna Scotland, MD;  Location: [facility] ORS;  Service: Urology;  Laterality: N/A;  . MYOMECTOMY     x2  . UTERINE FIBROID SURGERY  1978   Family History  Problem Relation Age of Onset  . Lung cancer Father   . Heart disease Mother        congestive heart failure  . Hypertension Mother   . Lung cancer Brother   . Diabetes Brother   . Colon polyps Sister   . Breast cancer Neg Hx   . Prostate cancer Neg Hx   . Kidney cancer Neg Hx    Social History   Socioeconomic History  . Marital status: Married    Spouse name: Not on file  . Number of children: Not on file  . Years of education:  Not on file  . Highest education level: Not on file  Occupational History  . Not on file  Tobacco Use  . Smoking status: Former Games developer  . Smokeless tobacco: Never Used  . Tobacco comment: quit smoking 1990  Vaping Use  . Vaping Use: Never used  Substance and Sexual Activity  . Alcohol use: Yes    Alcohol/week: 0.0 standard drinks    Comment: glass of wine per day  . Drug use: No  . Sexual activity: Not Currently  Other Topics Concern  . Not on file  Social History Narrative  . Not on file   Social Determinants of Health   Financial Resource Strain: Low Risk   . Difficulty of Paying Living Expenses: Not hard at all  Food Insecurity: No Food Insecurity  . Worried About Programme researcher, broadcasting/film/video in the Last Year: Never true  . Ran Out of Food in the Last Year: Never true  Transportation Needs: No Transportation Needs  . Lack of Transportation (Medical): No  . Lack of Transportation (Non-Medical): No  Physical Activity: Sufficiently Active  . Days of Exercise per Week: 6 days  . Minutes of Exercise per Session: 60 min  Stress: No Stress Concern Present  . Feeling of Stress : Not at all  Social Connections:  Unknown  . Frequency of Communication with Friends and Family: Not on file  . Frequency of Social Gatherings with Friends and Family: Not on file  . Attends Religious Services: Not on file  . Active Member of Clubs or Organizations: Not on file  . Attends Archivist Meetings: Not on file  . Marital Status: Married    Outpatient Encounter Medications as of 08/09/2020  Medication Sig  . Cholecalciferol (VITAMIN D) 2000 units CAPS Take 2,000 Units by mouth daily.  . fluticasone (FLONASE) 50 MCG/ACT nasal spray Place 1 spray into both nostrils as needed for allergies or rhinitis. (Patient taking differently: Place 2 sprays into both nostrils as needed for allergies or rhinitis. )  . lisinopril (ZESTRIL) 10 MG tablet TAKE 1 TABLET BY MOUTH EVERY DAY  . rosuvastatin  (CRESTOR) 10 MG tablet TAKE 1 TABLET BY MOUTH EVERY DAY  . vitamin E 400 UNIT capsule Take 400 Units by mouth daily.   No facility-administered encounter medications on file as of 08/09/2020.    Review of Systems  Constitutional: Negative for appetite change and unexpected weight change.  HENT: Negative for congestion, sinus pressure and sore throat.   Respiratory: Negative for cough, chest tightness and shortness of breath.   Cardiovascular: Negative for chest pain, palpitations and leg swelling.  Gastrointestinal: Negative for abdominal pain, diarrhea, nausea and vomiting.  Genitourinary: Negative for difficulty urinating and dysuria.  Musculoskeletal: Negative for joint swelling and myalgias.  Skin: Negative for color change and rash.  Neurological: Negative for dizziness, light-headedness and headaches.  Psychiatric/Behavioral: Negative for agitation and dysphoric mood.       Objective:    Physical Exam Vitals reviewed.  Constitutional:      General: She is not in acute distress.    Appearance: Normal appearance.  HENT:     Head: Normocephalic and atraumatic.     Right Ear: External ear normal.     Left Ear: External ear normal.     Mouth/Throat:     Mouth: Oropharynx is clear and moist.  Eyes:     General: No scleral icterus.       Right eye: No discharge.        Left eye: No discharge.     Conjunctiva/sclera: Conjunctivae normal.  Neck:     Thyroid: No thyromegaly.  Cardiovascular:     Rate and Rhythm: Normal rate and regular rhythm.  Pulmonary:     Effort: No respiratory distress.     Breath sounds: Normal breath sounds. No wheezing.  Abdominal:     General: Bowel sounds are normal.     Palpations: Abdomen is soft.     Tenderness: There is no abdominal tenderness.  Musculoskeletal:        General: No swelling, tenderness or edema.     Cervical back: Neck supple. No tenderness.  Lymphadenopathy:     Cervical: No cervical adenopathy.  Skin:    Findings: No  erythema or rash.  Neurological:     Mental Status: She is alert.  Psychiatric:        Mood and Affect: Mood normal.        Behavior: Behavior normal.     BP 132/78   Pulse 88   Temp 97.9 F (36.6 C) (Oral)   Resp 16   Ht 5\' 6"  (1.676 m)   Wt 203 lb 12.8 oz (92.4 kg)   LMP 10/15/1999   SpO2 99%   BMI 32.89 kg/m  Wt Readings from Last 3  Encounters:  08/09/20 203 lb 12.8 oz (92.4 kg)  07/23/20 202 lb (91.6 kg)  04/08/20 202 lb 12.8 oz (92 kg)     Lab Results  Component Value Date   WBC 5.6 09/29/2019   HGB 13.1 09/29/2019   HCT 41.1 09/29/2019   PLT 235.0 09/29/2019   GLUCOSE 86 08/05/2020   CHOL 136 08/05/2020   TRIG 35.0 08/05/2020   HDL 68.60 08/05/2020   LDLCALC 61 08/05/2020   ALT 9 08/05/2020   AST 14 08/05/2020   NA 139 08/05/2020   K 4.8 08/05/2020   CL 105 08/05/2020   CREATININE 0.95 08/05/2020   BUN 15 08/05/2020   CO2 29 08/05/2020   TSH 2.42 09/29/2019    MM 3D SCREEN BREAST BILATERAL  Result Date: 08/26/2019 CLINICAL DATA:  Screening. EXAM: DIGITAL SCREENING BILATERAL MAMMOGRAM WITH TOMO AND CAD COMPARISON:  Previous exam(s). ACR Breast Density Category b: There are scattered areas of fibroglandular density. FINDINGS: There are no findings suspicious for malignancy. Images were processed with CAD. IMPRESSION: No mammographic evidence of malignancy. A result letter of this screening mammogram will be mailed directly to the patient. RECOMMENDATION: Screening mammogram in one year. (Code:SM-B-01Y) BI-RADS CATEGORY  1: Negative. Electronically Signed   By: Ammie Ferrier M.D.   On: 08/26/2019 14:43       Assessment & Plan:   Problem List Items Addressed This Visit    Essential hypertension, benign    Continues on lisinopril.  Blood pressure improved - rechecked by me.  Follow pressures.        Relevant Orders   Basic metabolic panel   Anemia    Last hgb 09/2019 - wnl.  Recheck cbc with next labs.        Relevant Orders   CBC with  Differential/Platelet   Stress    Handling stress.  Appears to be doing well.  Follow.        Hypercholesterolemia    On crestor.  Low cholesterol diet and exercise.  Discussed labs.  Lab Results  Component Value Date   CHOL 136 08/05/2020   HDL 68.60 08/05/2020   LDLCALC 61 08/05/2020   TRIG 35.0 08/05/2020   CHOLHDL 2 08/05/2020        Relevant Orders   Hepatic function panel   Lipid panel   History of colonic polyps    Colonoscopy 07/2015.  Recommended f/u colonoscopy in 5 years.  Refer to GI for f/u colonoscopy.        Relevant Orders   Ambulatory referral to Gastroenterology    Other Visit Diagnoses    Colon cancer screening    -  Primary   Relevant Orders   Ambulatory referral to Gastroenterology       Einar Pheasant, MD

## 2020-08-10 ENCOUNTER — Encounter: Payer: Self-pay | Admitting: Internal Medicine

## 2020-08-10 NOTE — Assessment & Plan Note (Signed)
Last hgb 09/2019 - wnl.  Recheck cbc with next labs.

## 2020-08-10 NOTE — Assessment & Plan Note (Signed)
On crestor.  Low cholesterol diet and exercise.  Discussed labs.  Lab Results  Component Value Date   CHOL 136 08/05/2020   HDL 68.60 08/05/2020   LDLCALC 61 08/05/2020   TRIG 35.0 08/05/2020   CHOLHDL 2 08/05/2020

## 2020-08-10 NOTE — Assessment & Plan Note (Signed)
Handling stress.  Appears to be doing well.  Follow.  

## 2020-08-10 NOTE — Assessment & Plan Note (Signed)
Colonoscopy 07/2015.  Recommended f/u colonoscopy in 5 years.  Refer to GI for f/u colonoscopy.

## 2020-08-10 NOTE — Assessment & Plan Note (Signed)
Continues on lisinopril.  Blood pressure improved - rechecked by me.  Follow pressures.

## 2020-08-30 ENCOUNTER — Other Ambulatory Visit: Payer: Self-pay | Admitting: Internal Medicine

## 2020-08-30 ENCOUNTER — Other Ambulatory Visit: Payer: Self-pay

## 2020-08-30 ENCOUNTER — Ambulatory Visit
Admission: RE | Admit: 2020-08-30 | Discharge: 2020-08-30 | Disposition: A | Discharge status: Home / Self Care | Payer: Medicare Other | Source: Ambulatory Visit | Attending: Internal Medicine | Admitting: Internal Medicine

## 2020-08-30 DIAGNOSIS — Z1231 Encounter for screening mammogram for malignant neoplasm of breast: Secondary | ICD-10-CM | POA: Insufficient documentation

## 2020-12-07 ENCOUNTER — Other Ambulatory Visit: Payer: Self-pay

## 2020-12-07 ENCOUNTER — Other Ambulatory Visit (INDEPENDENT_AMBULATORY_CARE_PROVIDER_SITE_OTHER): Payer: Medicare Other

## 2020-12-07 DIAGNOSIS — I10 Essential (primary) hypertension: Secondary | ICD-10-CM | POA: Diagnosis not present

## 2020-12-07 DIAGNOSIS — D649 Anemia, unspecified: Secondary | ICD-10-CM

## 2020-12-07 DIAGNOSIS — E7800 Pure hypercholesterolemia, unspecified: Secondary | ICD-10-CM

## 2020-12-07 LAB — HEPATIC FUNCTION PANEL
ALT: 9 U/L (ref 0–35)
AST: 15 U/L (ref 0–37)
Albumin: 3.9 g/dL (ref 3.5–5.2)
Alkaline Phosphatase: 47 U/L (ref 39–117)
Bilirubin, Direct: 0.1 mg/dL (ref 0.0–0.3)
Total Bilirubin: 0.8 mg/dL (ref 0.2–1.2)
Total Protein: 7 g/dL (ref 6.0–8.3)

## 2020-12-07 LAB — CBC WITH DIFFERENTIAL/PLATELET
Basophils Absolute: 0 10*3/uL (ref 0.0–0.1)
Basophils Relative: 0.5 % (ref 0.0–3.0)
Eosinophils Absolute: 0.4 10*3/uL (ref 0.0–0.7)
Eosinophils Relative: 6.6 % — ABNORMAL HIGH (ref 0.0–5.0)
HCT: 38.1 % (ref 36.0–46.0)
Hemoglobin: 12.4 g/dL (ref 12.0–15.0)
Lymphocytes Relative: 29 % (ref 12.0–46.0)
Lymphs Abs: 1.6 10*3/uL (ref 0.7–4.0)
MCHC: 32.7 g/dL (ref 30.0–36.0)
MCV: 86.3 fl (ref 78.0–100.0)
Monocytes Absolute: 0.6 10*3/uL (ref 0.1–1.0)
Monocytes Relative: 10.7 % (ref 3.0–12.0)
Neutro Abs: 2.9 10*3/uL (ref 1.4–7.7)
Neutrophils Relative %: 53.2 % (ref 43.0–77.0)
Platelets: 247 10*3/uL (ref 150.0–400.0)
RBC: 4.41 Mil/uL (ref 3.87–5.11)
RDW: 15.3 % (ref 11.5–15.5)
WBC: 5.5 10*3/uL (ref 4.0–10.5)

## 2020-12-07 LAB — LIPID PANEL
Cholesterol: 143 mg/dL (ref 0–200)
HDL: 66.7 mg/dL (ref >39.00)
LDL Cholesterol: 67 mg/dL (ref 0–99)
NonHDL: 76.26
Total CHOL/HDL Ratio: 2
Triglycerides: 47 mg/dL (ref 0.0–149.0)
VLDL: 9.4 mg/dL (ref 0.0–40.0)

## 2020-12-07 LAB — BASIC METABOLIC PANEL
BUN: 12 mg/dL (ref 6–23)
CO2: 30 mEq/L (ref 19–32)
Calcium: 9.3 mg/dL (ref 8.4–10.5)
Chloride: 104 mEq/L (ref 96–112)
Creatinine, Ser: 1.04 mg/dL (ref 0.40–1.20)
GFR: 53.85 mL/min — ABNORMAL LOW (ref >60.00)
Glucose, Bld: 90 mg/dL (ref 70–99)
Potassium: 4.4 mEq/L (ref 3.5–5.1)
Sodium: 140 mEq/L (ref 135–145)

## 2020-12-08 ENCOUNTER — Other Ambulatory Visit: Payer: Self-pay | Admitting: Internal Medicine

## 2020-12-09 ENCOUNTER — Ambulatory Visit (INDEPENDENT_AMBULATORY_CARE_PROVIDER_SITE_OTHER): Payer: Medicare Other | Admitting: Internal Medicine

## 2020-12-09 ENCOUNTER — Other Ambulatory Visit: Payer: Self-pay

## 2020-12-09 DIAGNOSIS — Z86010 Personal history of colonic polyps: Secondary | ICD-10-CM | POA: Diagnosis not present

## 2020-12-09 DIAGNOSIS — E7800 Pure hypercholesterolemia, unspecified: Secondary | ICD-10-CM | POA: Diagnosis not present

## 2020-12-09 DIAGNOSIS — Z0000 Encounter for general adult medical examination without abnormal findings: Secondary | ICD-10-CM | POA: Diagnosis not present

## 2020-12-09 DIAGNOSIS — D649 Anemia, unspecified: Secondary | ICD-10-CM

## 2020-12-09 DIAGNOSIS — I10 Essential (primary) hypertension: Secondary | ICD-10-CM

## 2020-12-09 NOTE — Progress Notes (Signed)
Subjective:    Patient ID: Janice Floyd, female    DOB: [birth year], 70 y.o.   MRN: [id number]  HPI This visit occurred during the SARS-CoV-2 public health emergency.  Safety protocols were in place, including screening questions prior to the visit, additional usage of staff PPE, and extensive cleaning of exam room while observing appropriate contact time as indicated for disinfecting solutions.  Patient with past history of hypertension and hypercholesterolemia.  She comes in today to follow up on these issues as well as for a complete physical exam.  She is doing well.  Stays active.  Doing yoga two days per week.  Started walking.  No chest pain or sob with increased activity or exertion.  No acid reflux reported.  No abdominal pain.  No problems with her bowels.  Discussed labs.  Discussed due colonoscopy.    Past Medical History:  Diagnosis Date  . Anemia    iron deficient  . Depression   . Hyperlipemia   . Hypertension   . Vitamin D deficiency    Past Surgical History:  Procedure Laterality Date  . APPENDECTOMY    . COLONOSCOPY WITH PROPOFOL N/A 07/15/2015   Procedure: COLONOSCOPY WITH PROPOFOL;  Surgeon: Hulen Luster, MD;  Location: [facility] ENDOSCOPY;  Service: Gastroenterology;  Laterality: N/A;  . CYSTOSCOPY WITH BIOPSY N/A 04/16/2017   Procedure: CYSTOSCOPY WITH BLADDER NECK BIOPSY AND URETHRAL BIOPSY;  Surgeon: Hollice Espy, MD;  Location: [facility] ORS;  Service: Urology;  Laterality: N/A;  . MYOMECTOMY     x2  . UTERINE FIBROID SURGERY  1978   Family History  Problem Relation Age of Onset  . Lung cancer Father   . Heart disease Mother        congestive heart failure  . Hypertension Mother   . Lung cancer Brother   . Diabetes Brother   . Colon polyps Sister   . Breast cancer Neg Hx   . Prostate cancer Neg Hx   . Kidney cancer Neg Hx    Social History   Socioeconomic History  . Marital status: Married    Spouse name: Not on file  . Number of children: Not on file   . Years of education: Not on file  . Highest education level: Not on file  Occupational History  . Not on file  Tobacco Use  . Smoking status: Former Research scientist (life sciences)  . Smokeless tobacco: Never Used  . Tobacco comment: quit smoking 1990  Vaping Use  . Vaping Use: Never used  Substance and Sexual Activity  . Alcohol use: Yes    Alcohol/week: 0.0 standard drinks    Comment: glass of wine per day  . Drug use: No  . Sexual activity: Not Currently  Other Topics Concern  . Not on file  Social History Narrative  . Not on file   Social Determinants of Health   Financial Resource Strain: Low Risk   . Difficulty of Paying Living Expenses: Not hard at all  Food Insecurity: No Food Insecurity  . Worried About Charity fundraiser in the Last Year: Never true  . Ran Out of Food in the Last Year: Never true  Transportation Needs: No Transportation Needs  . Lack of Transportation (Medical): No  . Lack of Transportation (Non-Medical): No  Physical Activity: Sufficiently Active  . Days of Exercise per Week: 6 days  . Minutes of Exercise per Session: 60 min  Stress: No Stress Concern Present  . Feeling of Stress : Not at  all  Social Connections: Unknown  . Frequency of Communication with Friends and Family: Not on file  . Frequency of Social Gatherings with Friends and Family: Not on file  . Attends Religious Services: Not on file  . Active Member of Clubs or Organizations: Not on file  . Attends Archivist Meetings: Not on file  . Marital Status: Married    Outpatient Encounter Medications as of 12/09/2020  Medication Sig  . Cholecalciferol (VITAMIN D) 2000 units CAPS Take 2,000 Units by mouth daily.  . fluticasone (FLONASE) 50 MCG/ACT nasal spray Place 1 spray into both nostrils as needed for allergies or rhinitis. (Patient taking differently: Place 2 sprays into both nostrils as needed for allergies or rhinitis.)  . lisinopril (ZESTRIL) 10 MG tablet TAKE 1 TABLET BY MOUTH EVERY DAY   . rosuvastatin (CRESTOR) 10 MG tablet TAKE 1 TABLET BY MOUTH EVERY DAY  . vitamin E 400 UNIT capsule Take 400 Units by mouth daily.   No facility-administered encounter medications on file as of 12/09/2020.    Review of Systems  Constitutional: Negative for appetite change and unexpected weight change.  HENT: Negative for congestion, sinus pressure and sore throat.   Eyes: Negative for pain and visual disturbance.  Respiratory: Negative for cough, chest tightness and shortness of breath.   Cardiovascular: Negative for chest pain, palpitations and leg swelling.  Gastrointestinal: Negative for abdominal pain, diarrhea, nausea and vomiting.  Genitourinary: Negative for difficulty urinating and dysuria.  Musculoskeletal: Negative for joint swelling and myalgias.  Skin: Negative for color change and rash.  Neurological: Negative for dizziness, light-headedness and headaches.  Hematological: Negative for adenopathy. Does not bruise/bleed easily.  Psychiatric/Behavioral: Negative for agitation and dysphoric mood.       Objective:    Physical Exam Vitals reviewed.  Constitutional:      General: She is not in acute distress.    Appearance: Normal appearance. She is well-developed.  HENT:     Head: Normocephalic and atraumatic.     Right Ear: External ear normal.     Left Ear: External ear normal.  Eyes:     General: No scleral icterus.       Right eye: No discharge.        Left eye: No discharge.  Neck:     Thyroid: No thyromegaly.  Cardiovascular:     Rate and Rhythm: Normal rate and regular rhythm.  Pulmonary:     Effort: No tachypnea, accessory muscle usage or respiratory distress.     Breath sounds: Normal breath sounds. No decreased breath sounds or wheezing.  Chest:  Breasts:     Right: No inverted nipple, mass, nipple discharge or tenderness (no axillary adenopathy).     Left: No inverted nipple, mass, nipple discharge or tenderness (no axilarry adenopathy).     Abdominal:     General: Bowel sounds are normal.     Palpations: Abdomen is soft.     Tenderness: There is no abdominal tenderness.  Musculoskeletal:        General: No swelling or tenderness.     Cervical back: Neck supple. No tenderness.  Lymphadenopathy:     Cervical: No cervical adenopathy.  Skin:    Findings: No erythema or rash.  Neurological:     Mental Status: She is alert and oriented to person, place, and time.  Psychiatric:        Mood and Affect: Mood normal.        Behavior: Behavior normal.  BP 122/78   Pulse 90   Temp (!) 97 F (36.1 C) (Temporal)   Resp 16   Ht 5\' 6"  (1.676 m)   Wt 208 lb (94.3 kg)   LMP 10/15/1999   SpO2 99%   BMI 33.57 kg/m  Wt Readings from Last 3 Encounters:  12/09/20 208 lb (94.3 kg)  08/09/20 203 lb 12.8 oz (92.4 kg)  07/23/20 202 lb (91.6 kg)     Lab Results  Component Value Date   WBC 5.5 12/07/2020   HGB 12.4 12/07/2020   HCT 38.1 12/07/2020   PLT 247.0 12/07/2020   GLUCOSE 90 12/07/2020   CHOL 143 12/07/2020   TRIG 47.0 12/07/2020   HDL 66.70 12/07/2020   LDLCALC 67 12/07/2020   ALT 9 12/07/2020   AST 15 12/07/2020   NA 140 12/07/2020   K 4.4 12/07/2020   CL 104 12/07/2020   CREATININE 1.04 12/07/2020   BUN 12 12/07/2020   CO2 30 12/07/2020   TSH 2.42 09/29/2019    MM 3D SCREEN BREAST BILATERAL  Result Date: 08/30/2020 CLINICAL DATA:  Screening. EXAM: DIGITAL SCREENING BILATERAL MAMMOGRAM WITH TOMO AND CAD COMPARISON:  Previous exam(s). ACR Breast Density Category b: There are scattered areas of fibroglandular density. FINDINGS: There are no findings suspicious for malignancy. The images were evaluated with computer-aided detection. IMPRESSION: No mammographic evidence of malignancy. A result letter of this screening mammogram will be mailed directly to the patient. RECOMMENDATION: Screening mammogram in one year. (Code:SM-B-01Y) BI-RADS CATEGORY  1: Negative. Electronically Signed   By: Evangeline Dakin  M.D.   On: 08/30/2020 12:08       Assessment & Plan:   Problem List Items Addressed This Visit    Anemia    Follow cbc.       Essential hypertension, benign    Continue lisinopril.  Blood pressure doing well.  Follow pressures.  Follow metabolic panel.       Relevant Orders   TSH   Basic metabolic panel   Health care maintenance    Physical today 12/09/20.  Colonoscopy 07/2015 - three polyps and diverticulosis.  Recommended f/u colonoscopy in 5 years.  Due f/u colonoscopy.  Mammogram 08/30/20 - birads I.       History of colonic polyps    Colonoscopy 2016.  Recommended f/u colonoscopy in 5 years.  Overdue.  Refer to GI.        Relevant Orders   Ambulatory referral to Gastroenterology   Hypercholesterolemia    Continue crestor.  Low cholesterol diet and exercise.  Follow lipid panel and liver function tests.        Relevant Orders   Lipid panel   Hepatic function panel       Einar Pheasant, MD

## 2020-12-09 NOTE — Assessment & Plan Note (Addendum)
Physical today 12/09/20.  Colonoscopy 07/2015 - three polyps and diverticulosis.  Recommended f/u colonoscopy in 5 years.  Due f/u colonoscopy.  Mammogram 08/30/20 - birads I.

## 2020-12-19 ENCOUNTER — Encounter: Payer: Self-pay | Admitting: Internal Medicine

## 2020-12-19 NOTE — Assessment & Plan Note (Signed)
Continue crestor.  Low cholesterol diet and exercise. Follow lipid panel and liver function tests.   

## 2020-12-19 NOTE — Assessment & Plan Note (Signed)
Continue lisinopril.  Blood pressure doing well.  Follow pressures.  Follow metabolic panel.  

## 2020-12-19 NOTE — Assessment & Plan Note (Signed)
Colonoscopy 2016.  Recommended f/u colonoscopy in 5 years.  Overdue.  Refer to GI.

## 2020-12-19 NOTE — Assessment & Plan Note (Signed)
Follow cbc.  

## 2020-12-21 DIAGNOSIS — H2513 Age-related nuclear cataract, bilateral: Secondary | ICD-10-CM | POA: Diagnosis not present

## 2021-02-15 ENCOUNTER — Other Ambulatory Visit: Payer: Self-pay | Admitting: Internal Medicine

## 2021-02-18 DIAGNOSIS — K5730 Diverticulosis of large intestine without perforation or abscess without bleeding: Secondary | ICD-10-CM | POA: Diagnosis not present

## 2021-02-18 DIAGNOSIS — Z8371 Family history of colonic polyps: Secondary | ICD-10-CM | POA: Diagnosis not present

## 2021-02-18 DIAGNOSIS — K635 Polyp of colon: Secondary | ICD-10-CM | POA: Diagnosis not present

## 2021-04-04 ENCOUNTER — Telehealth: Payer: Self-pay | Admitting: Internal Medicine

## 2021-04-04 NOTE — Telephone Encounter (Signed)
Given blood pressures are remaining elevated, I would like to adjust her medication.  Per note, she is currently on lisinopril '10mg'$  q day.  Have her increase to '20mg'$  q day.  Continue to monitor blood pressure and keep f/u.  Any problems before, let us know.

## 2021-04-04 NOTE — Telephone Encounter (Signed)
Patient stated that she was told to monitor her BP. Confirmed no acute symptoms. No new stressors added. She is taking her lisinopril 10 mg q day. She has noticed over the last couple weeks her BP has been running slightly higher than usual for her. 139/90 and 140/85 were two examples. She has upcoming appt 9/14. She is ok to wait until then to address if you are. Advised her to continue her medication and monitoring BP until advised otherwise. Pt gave verbal understanding.

## 2021-04-04 NOTE — Telephone Encounter (Signed)
Patient is aware of below. 

## 2021-04-04 NOTE — Telephone Encounter (Signed)
Patient states blood pressure has been elevated over the past couple weeks.  Has been running 139/90 or 140/85. Patient declines any symptoms. Patient taking lisinopril 10 mg once daily. States this is elevated for her.    Please advise

## 2021-04-12 DIAGNOSIS — Z1211 Encounter for screening for malignant neoplasm of colon: Secondary | ICD-10-CM | POA: Diagnosis not present

## 2021-04-12 DIAGNOSIS — K5730 Diverticulosis of large intestine without perforation or abscess without bleeding: Secondary | ICD-10-CM | POA: Diagnosis not present

## 2021-04-12 DIAGNOSIS — I10 Essential (primary) hypertension: Secondary | ICD-10-CM | POA: Diagnosis not present

## 2021-04-12 DIAGNOSIS — Z8371 Family history of colonic polyps: Secondary | ICD-10-CM | POA: Diagnosis not present

## 2021-04-12 DIAGNOSIS — K640 First degree hemorrhoids: Secondary | ICD-10-CM | POA: Diagnosis not present

## 2021-04-12 LAB — HM COLONOSCOPY

## 2021-04-18 ENCOUNTER — Other Ambulatory Visit (INDEPENDENT_AMBULATORY_CARE_PROVIDER_SITE_OTHER): Payer: Medicare Other

## 2021-04-18 ENCOUNTER — Other Ambulatory Visit: Payer: Self-pay

## 2021-04-18 DIAGNOSIS — I10 Essential (primary) hypertension: Secondary | ICD-10-CM | POA: Diagnosis not present

## 2021-04-18 DIAGNOSIS — E7800 Pure hypercholesterolemia, unspecified: Secondary | ICD-10-CM

## 2021-04-18 LAB — HEPATIC FUNCTION PANEL
ALT: 10 U/L (ref 0–35)
AST: 14 U/L (ref 0–37)
Albumin: 4 g/dL (ref 3.5–5.2)
Alkaline Phosphatase: 54 U/L (ref 39–117)
Bilirubin, Direct: 0.2 mg/dL (ref 0.0–0.3)
Total Bilirubin: 0.8 mg/dL (ref 0.2–1.2)
Total Protein: 7 g/dL (ref 6.0–8.3)

## 2021-04-18 LAB — BASIC METABOLIC PANEL
BUN: 17 mg/dL (ref 6–23)
CO2: 28 mEq/L (ref 19–32)
Calcium: 9.5 mg/dL (ref 8.4–10.5)
Chloride: 103 mEq/L (ref 96–112)
Creatinine, Ser: 1.04 mg/dL (ref 0.40–1.20)
GFR: 53.71 mL/min — ABNORMAL LOW (ref >60.00)
Glucose, Bld: 91 mg/dL (ref 70–99)
Potassium: 4.2 mEq/L (ref 3.5–5.1)
Sodium: 140 mEq/L (ref 135–145)

## 2021-04-18 LAB — LIPID PANEL
Cholesterol: 141 mg/dL (ref 0–200)
HDL: 62 mg/dL (ref >39.00)
LDL Cholesterol: 69 mg/dL (ref 0–99)
NonHDL: 79.05
Total CHOL/HDL Ratio: 2
Triglycerides: 50 mg/dL (ref 0.0–149.0)
VLDL: 10 mg/dL (ref 0.0–40.0)

## 2021-04-18 LAB — TSH: TSH: 2.59 u[IU]/mL (ref 0.35–5.50)

## 2021-04-20 ENCOUNTER — Other Ambulatory Visit: Payer: Self-pay

## 2021-04-20 ENCOUNTER — Ambulatory Visit (INDEPENDENT_AMBULATORY_CARE_PROVIDER_SITE_OTHER): Payer: Medicare Other | Admitting: Internal Medicine

## 2021-04-20 VITALS — BP 138/80 | HR 69 | Temp 97.8°F | Resp 16 | Ht 66.0 in | Wt 206.6 lb

## 2021-04-20 DIAGNOSIS — Z86010 Personal history of colonic polyps: Secondary | ICD-10-CM | POA: Diagnosis not present

## 2021-04-20 DIAGNOSIS — F439 Reaction to severe stress, unspecified: Secondary | ICD-10-CM

## 2021-04-20 DIAGNOSIS — Z23 Encounter for immunization: Secondary | ICD-10-CM | POA: Diagnosis not present

## 2021-04-20 DIAGNOSIS — E7800 Pure hypercholesterolemia, unspecified: Secondary | ICD-10-CM

## 2021-04-20 DIAGNOSIS — I10 Essential (primary) hypertension: Secondary | ICD-10-CM

## 2021-04-20 MED ORDER — LISINOPRIL 20 MG PO TABS: 20.0000 mg | ORAL_TABLET | Freq: Every day | ORAL | 1 refills | Status: DC

## 2021-04-20 MED ORDER — AMLODIPINE BESYLATE 2.5 MG PO TABS: 2.5000 mg | ORAL_TABLET | Freq: Every day | ORAL | 2 refills | Status: DC

## 2021-04-20 NOTE — Progress Notes (Signed)
Patient ID: Janice Floyd, female   DOB: 29-Nov-1950, 70 y.o.   MRN: [id number]   Subjective:    Patient ID: Janice Floyd, female    DOB: 31-Oct-1950, 70 y.o.   MRN: [id number]  This visit occurred during the SARS-CoV-2 public health emergency.  Safety protocols were in place, including screening questions prior to the visit, additional usage of staff PPE, and extensive cleaning of exam room while observing appropriate contact time as indicated for disinfecting solutions.   Patient here for a scheduled follow up.    Chief Complaint  Patient presents with   Hypertension   .   HPI Blood pressure has been elevated recently. Lisinopril was increased to '20mg'$  recently.  She stays active.  No chest pain.  No sob.  No acid reflux.  No abdominal pain or bowel change reported.     Past Medical History:  Diagnosis Date   Anemia    iron deficient   Depression    Hyperlipemia    Hypertension    Vitamin D deficiency    Past Surgical History:  Procedure Laterality Date   APPENDECTOMY     COLONOSCOPY WITH PROPOFOL N/A 07/15/2015   Procedure: COLONOSCOPY WITH PROPOFOL;  Surgeon: Hulen Luster, MD;  Location[facility] ENDOSCOPY;  Service: Gastroenterology;  Laterality: N/A;   CYSTOSCOPY WITH BIOPSY N/A 04/16/2017   Procedure: CYSTOSCOPY WITH BLADDER NECK BIOPSY AND URETHRAL BIOPSY;  Surgeon: Hollice Espy, MD;  Location: [facility] ORS;  Service: Urology;  Laterality: N/A;   MYOMECTOMY     x2   UTERINE FIBROID SURGERY  1978   Family History  Problem Relation Age of Onset   Lung cancer Father    Heart disease Mother        congestive heart failure   Hypertension Mother    Lung cancer Brother    Diabetes Brother    Colon polyps Sister    Breast cancer Neg Hx    Prostate cancer Neg Hx    Kidney cancer Neg Hx    Social History   Socioeconomic History   Marital status: Married    Spouse name: Not on file   Number of children: Not on file   Years of education: Not on file   Highest education  level: Not on file  Occupational History   Not on file  Tobacco Use   Smoking status: Former   Smokeless tobacco: Never   Tobacco comments:    quit smoking 1990  Vaping Use   Vaping Use: Never used  Substance and Sexual Activity   Alcohol use: Yes    Alcohol/week: 0.0 standard drinks    Comment: glass of wine per day   Drug use: No   Sexual activity: Not Currently  Other Topics Concern   Not on file  Social History Narrative   Not on file   Social Determinants of Health   Financial Resource Strain: Low Risk    Difficulty of Paying Living Expenses: Not hard at all  Food Insecurity: No Food Insecurity   Worried About Charity fundraiser in the Last Year: Never true   Craig in the Last Year: Never true  Transportation Needs: No Transportation Needs   Lack of Transportation (Medical): No   Lack of Transportation (Non-Medical): No  Physical Activity: Sufficiently Active   Days of Exercise per Week: 6 days   Minutes of Exercise per Session: 60 min  Stress: No Stress Concern Present   Feeling of Stress : Not  at all  Social Connections: Unknown   Frequency of Communication with Friends and Family: Not on file   Frequency of Social Gatherings with Friends and Family: Not on file   Attends Religious Services: Not on file   Active Member of Clubs or Organizations: Not on file   Attends Archivist Meetings: Not on file   Marital Status: Married    Review of Systems  Constitutional:  Negative for appetite change and unexpected weight change.  HENT:  Negative for congestion and sinus pressure.   Respiratory:  Negative for cough, chest tightness and shortness of breath.   Cardiovascular:  Negative for chest pain, palpitations and leg swelling.  Gastrointestinal:  Negative for abdominal pain, diarrhea, nausea and vomiting.  Genitourinary:  Negative for difficulty urinating and dysuria.  Musculoskeletal:  Negative for joint swelling and myalgias.  Skin:   Negative for color change and rash.  Neurological:  Negative for dizziness, light-headedness and headaches.  Psychiatric/Behavioral:  Negative for agitation and dysphoric mood.       Objective:     BP 138/80   Pulse 69   Temp 97.8 F (36.6 C)   Resp 16   Ht '5\' 6"'$  (1.676 m)   Wt 206 lb 9.6 oz (93.7 kg)   LMP 10/15/1999   SpO2 97%   BMI 33.35 kg/m  Wt Readings from Last 3 Encounters:  04/20/21 206 lb 9.6 oz (93.7 kg)  12/09/20 208 lb (94.3 kg)  08/09/20 203 lb 12.8 oz (92.4 kg)    Physical Exam Vitals reviewed.  Constitutional:      General: She is not in acute distress.    Appearance: Normal appearance.  HENT:     Head: Normocephalic and atraumatic.     Right Ear: External ear normal.     Left Ear: External ear normal.  Eyes:     General: No scleral icterus.       Right eye: No discharge.        Left eye: No discharge.     Conjunctiva/sclera: Conjunctivae normal.  Neck:     Thyroid: No thyromegaly.  Cardiovascular:     Rate and Rhythm: Normal rate and regular rhythm.  Pulmonary:     Effort: No respiratory distress.     Breath sounds: Normal breath sounds. No wheezing.  Abdominal:     General: Bowel sounds are normal.     Palpations: Abdomen is soft.     Tenderness: There is no abdominal tenderness.  Musculoskeletal:        General: No swelling or tenderness.     Cervical back: Neck supple. No tenderness.  Lymphadenopathy:     Cervical: No cervical adenopathy.  Skin:    Findings: No erythema or rash.  Neurological:     Mental Status: She is alert.  Psychiatric:        Mood and Affect: Mood normal.        Behavior: Behavior normal.     Outpatient Encounter Medications as of 04/20/2021  Medication Sig   amLODipine (NORVASC) 2.5 MG tablet Take 1 tablet (2.5 mg total) by mouth daily.   lisinopril (ZESTRIL) 20 MG tablet Take 1 tablet (20 mg total) by mouth daily.   Cholecalciferol (VITAMIN D) 2000 units CAPS Take 2,000 Units by mouth daily.   fluticasone  (FLONASE) 50 MCG/ACT nasal spray Place 1 spray into both nostrils as needed for allergies or rhinitis. (Patient taking differently: Place 2 sprays into both nostrils as needed for allergies or rhinitis.)  rosuvastatin (CRESTOR) 10 MG tablet TAKE 1 TABLET BY MOUTH EVERY DAY   vitamin E 400 UNIT capsule Take 400 Units by mouth daily.   [DISCONTINUED] lisinopril (ZESTRIL) 10 MG tablet TAKE 1 TABLET BY MOUTH EVERY DAY (Patient taking differently: 20 mg.)   No facility-administered encounter medications on file as of 04/20/2021.     Lab Results  Component Value Date   WBC 5.5 12/07/2020   HGB 12.4 12/07/2020   HCT 38.1 12/07/2020   PLT 247.0 12/07/2020   GLUCOSE 91 04/18/2021   CHOL 141 04/18/2021   TRIG 50.0 04/18/2021   HDL 62.00 04/18/2021   LDLCALC 69 04/18/2021   ALT 10 04/18/2021   AST 14 04/18/2021   NA 140 04/18/2021   K 4.2 04/18/2021   CL 103 04/18/2021   CREATININE 1.04 04/18/2021   BUN 17 04/18/2021   CO2 28 04/18/2021   TSH 2.59 04/18/2021    MM 3D SCREEN BREAST BILATERAL  Result Date: 08/30/2020 CLINICAL DATA:  Screening. EXAM: DIGITAL SCREENING BILATERAL MAMMOGRAM WITH TOMO AND CAD COMPARISON:  Previous exam(s). ACR Breast Density Category b: There are scattered areas of fibroglandular density. FINDINGS: There are no findings suspicious for malignancy. The images were evaluated with computer-aided detection. IMPRESSION: No mammographic evidence of malignancy. A result letter of this screening mammogram will be mailed directly to the patient. RECOMMENDATION: Screening mammogram in one year. (Code:SM-B-01Y) BI-RADS CATEGORY  1: Negative. Electronically Signed   By: Evangeline Dakin M.D.   On: 08/30/2020 12:08       Assessment & Plan:   Problem List Items Addressed This Visit     Essential hypertension, benign    On lisinopril.  Just increased to '20mg'$  q day.  Pressure as outlined.  Still elevated above goal.  Start amlodipine 2.5 mg q day.  Have her spot check her  pressure.  Get her back in soon to reassess.        Relevant Medications   lisinopril (ZESTRIL) 20 MG tablet   amLODipine (NORVASC) 2.5 MG tablet   History of colonic polyps    Was referred to GI.  Need results of latest colonoscopy.       Hypercholesterolemia    Continue crestor.  Low cholesterol diet and exercise.  Follow lipid panel and liver function tests.        Relevant Medications   lisinopril (ZESTRIL) 20 MG tablet   amLODipine (NORVASC) 2.5 MG tablet   Stress    Overall appears to be handling things well.  Doing well.  Follow.       Other Visit Diagnoses     Need for immunization against influenza    -  Primary   Relevant Orders   Flu Vaccine QUAD High Dose(Fluad) (Completed)        Einar Pheasant, MD

## 2021-04-30 ENCOUNTER — Encounter: Payer: Self-pay | Admitting: Internal Medicine

## 2021-04-30 NOTE — Assessment & Plan Note (Signed)
On lisinopril.  Just increased to 20mg  q day.  Pressure as outlined.  Still elevated above goal.  Start amlodipine 2.5 mg q day.  Have her spot check her pressure.  Get her back in soon to reassess.

## 2021-04-30 NOTE — Assessment & Plan Note (Signed)
Continue crestor.  Low cholesterol diet and exercise. Follow lipid panel and liver function tests.   

## 2021-04-30 NOTE — Assessment & Plan Note (Signed)
Overall appears to be handling things well.  Doing well.  Follow.

## 2021-04-30 NOTE — Assessment & Plan Note (Signed)
Was referred to GI.  Need results of latest colonoscopy.

## 2021-06-09 ENCOUNTER — Other Ambulatory Visit: Payer: Self-pay | Admitting: Internal Medicine

## 2021-07-15 ENCOUNTER — Ambulatory Visit (INDEPENDENT_AMBULATORY_CARE_PROVIDER_SITE_OTHER): Payer: Medicare Other | Admitting: Internal Medicine

## 2021-07-15 ENCOUNTER — Other Ambulatory Visit: Payer: Self-pay

## 2021-07-15 ENCOUNTER — Encounter: Payer: Self-pay | Admitting: Internal Medicine

## 2021-07-15 DIAGNOSIS — E7800 Pure hypercholesterolemia, unspecified: Secondary | ICD-10-CM

## 2021-07-15 DIAGNOSIS — Z86010 Personal history of colonic polyps: Secondary | ICD-10-CM

## 2021-07-15 DIAGNOSIS — I10 Essential (primary) hypertension: Secondary | ICD-10-CM | POA: Diagnosis not present

## 2021-07-15 DIAGNOSIS — F439 Reaction to severe stress, unspecified: Secondary | ICD-10-CM | POA: Diagnosis not present

## 2021-07-15 DIAGNOSIS — R059 Cough, unspecified: Secondary | ICD-10-CM

## 2021-07-15 DIAGNOSIS — R591 Generalized enlarged lymph nodes: Secondary | ICD-10-CM | POA: Diagnosis not present

## 2021-07-15 DIAGNOSIS — D649 Anemia, unspecified: Secondary | ICD-10-CM | POA: Diagnosis not present

## 2021-07-15 MED ORDER — TELMISARTAN 40 MG PO TABS: 40.0000 mg | ORAL_TABLET | Freq: Every day | ORAL | 2 refills | Status: DC

## 2021-07-15 NOTE — Patient Instructions (Addendum)
Delsym cough syrup   Stop lisinopril and start micardis 40mg  per day.

## 2021-07-15 NOTE — Progress Notes (Signed)
Patient ID: Janice Floyd, female   DOB: 03-12-1951, 70 y.o.   MRN: [id number]   Subjective:    Patient ID: Janice Floyd, female    DOB: 1951/05/02, 70 y.o.   MRN: [id number]  This visit occurred during the SARS-CoV-2 public health emergency.  Safety protocols were in place, including screening questions prior to the visit, additional usage of staff PPE, and extensive cleaning of exam room while observing appropriate contact time as indicated for disinfecting solutions.   Patient here for a scheduled follow up.   Chief Complaint  Patient presents with   Follow-up   Hypertension   .   HPI Taking micardis and amlodipine.  Has noticed a little ankle/pedal swelling since starting amlodipine.  No significant swelling.  Blood pressure 139/70-80.  Remains higher than goal.  No chest pain or sob reported.  Stays active.  Repots that two days before Thanksgiving developed scratchy throat and throat congestion.  Increased cough.  Right ear - bothered her.  Also noticed swelling - right lymph node (neck).  Symptoms have improved.  No congestion and not cough now.  Decreased size of swollen lymph node.  No fever. No abdominal pain.  No diarrhea.    Past Medical History:  Diagnosis Date   Anemia    iron deficient   Depression    Hyperlipemia    Hypertension    Vitamin D deficiency    Past Surgical History:  Procedure Laterality Date   APPENDECTOMY     COLONOSCOPY WITH PROPOFOL N/A 07/15/2015   Procedure: COLONOSCOPY WITH PROPOFOL;  Surgeon: Hulen Luster, MD;  Location: [facility] ENDOSCOPY;  Service: Gastroenterology;  Laterality: N/A;   CYSTOSCOPY WITH BIOPSY N/A 04/16/2017   Procedure: CYSTOSCOPY WITH BLADDER NECK BIOPSY AND URETHRAL BIOPSY;  Surgeon: Hollice Espy, MD;  Location: [facility] ORS;  Service: Urology;  Laterality: N/A;   MYOMECTOMY     x2   UTERINE FIBROID SURGERY  1978   Family History  Problem Relation Age of Onset   Lung cancer Father    Heart disease Mother        congestive heart  failure   Hypertension Mother    Lung cancer Brother    Diabetes Brother    Colon polyps Sister    Breast cancer Neg Hx    Prostate cancer Neg Hx    Kidney cancer Neg Hx    Social History   Socioeconomic History   Marital status: Married    Spouse name: Not on file   Number of children: Not on file   Years of education: Not on file   Highest education level: Not on file  Occupational History   Not on file  Tobacco Use   Smoking status: Former   Smokeless tobacco: Never   Tobacco comments:    quit smoking 1990  Vaping Use   Vaping Use: Never used  Substance and Sexual Activity   Alcohol use: Yes    Alcohol/week: 0.0 standard drinks    Comment: glass of wine per day   Drug use: No   Sexual activity: Not Currently  Other Topics Concern   Not on file  Social History Narrative   Not on file   Social Determinants of Health   Financial Resource Strain: Low Risk    Difficulty of Paying Living Expenses: Not hard at all  Food Insecurity: No Food Insecurity   Worried About Charity fundraiser in the Last Year: Never true   Arboriculturist in  the Last Year: Never true  Transportation Needs: No Transportation Needs   Lack of Transportation (Medical): No   Lack of Transportation (Non-Medical): No  Physical Activity: Sufficiently Active   Days of Exercise per Week: 6 days   Minutes of Exercise per Session: 60 min  Stress: No Stress Concern Present   Feeling of Stress : Not at all  Social Connections: Unknown   Frequency of Communication with Friends and Family: Not on file   Frequency of Social Gatherings with Friends and Family: Not on file   Attends Religious Services: Not on file   Active Member of Clubs or Organizations: Not on file   Attends Archivist Meetings: Not on file   Marital Status: Married     Review of Systems  Constitutional:  Negative for appetite change and unexpected weight change.  HENT:  Negative for congestion and sinus pressure.    Respiratory:  Negative for cough, chest tightness and shortness of breath.   Cardiovascular:  Negative for chest pain and palpitations.       Describes minimal swelling as outlined.   Gastrointestinal:  Negative for abdominal pain, diarrhea, nausea and vomiting.  Genitourinary:  Negative for difficulty urinating and dysuria.  Musculoskeletal:  Negative for joint swelling and myalgias.  Skin:  Negative for color change and rash.  Neurological:  Negative for dizziness, light-headedness and headaches.  Psychiatric/Behavioral:  Negative for agitation and dysphoric mood.       Objective:     BP 138/80   Pulse 84   Temp 98.1 F (36.7 C) (Oral)   Resp 16   Ht 5\' 6"  (1.676 m)   Wt 200 lb 6 oz (90.9 kg)   LMP 10/15/1999   SpO2 98%   BMI 32.34 kg/m  Wt Readings from Last 3 Encounters:  07/15/21 200 lb 6 oz (90.9 kg)  04/20/21 206 lb 9.6 oz (93.7 kg)  12/09/20 208 lb (94.3 kg)    Physical Exam Vitals reviewed.  Constitutional:      General: She is not in acute distress.    Appearance: Normal appearance.  HENT:     Head: Normocephalic and atraumatic.     Right Ear: External ear normal.     Left Ear: External ear normal.  Eyes:     General: No scleral icterus.       Right eye: No discharge.        Left eye: No discharge.     Conjunctiva/sclera: Conjunctivae normal.  Neck:     Thyroid: No thyromegaly.  Cardiovascular:     Rate and Rhythm: Normal rate and regular rhythm.  Pulmonary:     Effort: No respiratory distress.     Breath sounds: Normal breath sounds. No wheezing.  Abdominal:     General: Bowel sounds are normal.     Palpations: Abdomen is soft.     Tenderness: There is no abdominal tenderness.  Musculoskeletal:        General: No swelling or tenderness.     Cervical back: Neck supple. No tenderness.  Lymphadenopathy:     Cervical: No cervical adenopathy.  Skin:    Findings: No erythema or rash.  Neurological:     Mental Status: She is alert.  Psychiatric:         Mood and Affect: Mood normal.        Behavior: Behavior normal.     Outpatient Encounter Medications as of 07/15/2021  Medication Sig   amLODipine (NORVASC) 2.5 MG tablet Take 1  tablet (2.5 mg total) by mouth daily.   Cholecalciferol (VITAMIN D) 2000 units CAPS Take 2,000 Units by mouth daily.   fluticasone (FLONASE) 50 MCG/ACT nasal spray Place 1 spray into both nostrils as needed for allergies or rhinitis. (Patient taking differently: Place 2 sprays into both nostrils as needed for allergies or rhinitis.)   rosuvastatin (CRESTOR) 10 MG tablet TAKE 1 TABLET BY MOUTH EVERY DAY   telmisartan (MICARDIS) 40 MG tablet Take 1 tablet (40 mg total) by mouth daily.   vitamin E 400 UNIT capsule Take 400 Units by mouth daily.   [DISCONTINUED] lisinopril (ZESTRIL) 20 MG tablet Take 1 tablet (20 mg total) by mouth daily.   No facility-administered encounter medications on file as of 07/15/2021.     Lab Results  Component Value Date   WBC 5.5 12/07/2020   HGB 12.4 12/07/2020   HCT 38.1 12/07/2020   PLT 247.0 12/07/2020   GLUCOSE 91 04/18/2021   CHOL 141 04/18/2021   TRIG 50.0 04/18/2021   HDL 62.00 04/18/2021   LDLCALC 69 04/18/2021   ALT 10 04/18/2021   AST 14 04/18/2021   NA 140 04/18/2021   K 4.2 04/18/2021   CL 103 04/18/2021   CREATININE 1.04 04/18/2021   BUN 17 04/18/2021   CO2 28 04/18/2021   TSH 2.59 04/18/2021    MM 3D SCREEN BREAST BILATERAL  Result Date: 08/30/2020 CLINICAL DATA:  Screening. EXAM: DIGITAL SCREENING BILATERAL MAMMOGRAM WITH TOMO AND CAD COMPARISON:  Previous exam(s). ACR Breast Density Category b: There are scattered areas of fibroglandular density. FINDINGS: There are no findings suspicious for malignancy. The images were evaluated with computer-aided detection. IMPRESSION: No mammographic evidence of malignancy. A result letter of this screening mammogram will be mailed directly to the patient. RECOMMENDATION: Screening mammogram in one year.  (Code:SM-B-01Y) BI-RADS CATEGORY  1: Negative. Electronically Signed   By: Evangeline Dakin M.D.   On: 08/30/2020 12:08       Assessment & Plan:   Problem List Items Addressed This Visit     Anemia    Follow cbc.       Cough    Minimal residual cough.  Delsym.  Change lisinopril to micardis as outlined.  Follow.  Notify me if persistent cough.       Essential hypertension, benign    Blood pressure as outlined.  Will continue low dose amlodipine for now.  Monitor for swelling.  Given dry cough, will change lisinopril to micardis and start micardis 40mg  q day.  Follow pressures.  Follow metabolic panel.        Relevant Medications   telmisartan (MICARDIS) 40 MG tablet   History of colonic polyps    Colonoscopy 04/12/21 - internal hemorrhoids and diverticulosis.  Recommended f/u colonoscopy in 5 years.        Hypercholesterolemia    Continue crestor.  Low cholesterol diet and exercise.  Follow lipid panel and liver function tests.        Relevant Medications   telmisartan (MICARDIS) 40 MG tablet   Lymphadenopathy    Enlarged lymph node - posterior cervical.  Per pt, smaller.  Has had increased congestion, etc.  Better now.  Follow to confirm complete resolution.        Stress    Overall appears to be handling things well.  Doing well.  Follow.         Einar Pheasant, MD

## 2021-07-16 ENCOUNTER — Encounter: Payer: Self-pay | Admitting: Internal Medicine

## 2021-07-16 DIAGNOSIS — R059 Cough, unspecified: Secondary | ICD-10-CM | POA: Insufficient documentation

## 2021-07-16 DIAGNOSIS — R591 Generalized enlarged lymph nodes: Secondary | ICD-10-CM | POA: Insufficient documentation

## 2021-07-16 NOTE — Assessment & Plan Note (Signed)
Minimal residual cough.  Delsym.  Change lisinopril to micardis as outlined.  Follow.  Notify me if persistent cough.

## 2021-07-16 NOTE — Assessment & Plan Note (Signed)
Overall appears to be handling things well.  Doing well.  Follow.

## 2021-07-16 NOTE — Assessment & Plan Note (Signed)
Continue crestor.  Low cholesterol diet and exercise. Follow lipid panel and liver function tests.   

## 2021-07-16 NOTE — Assessment & Plan Note (Signed)
Enlarged lymph node - posterior cervical.  Per pt, smaller.  Has had increased congestion, etc.  Better now.  Follow to confirm complete resolution.

## 2021-07-16 NOTE — Assessment & Plan Note (Signed)
Colonoscopy 04/12/21 - internal hemorrhoids and diverticulosis.  Recommended f/u colonoscopy in 5 years.

## 2021-07-16 NOTE — Assessment & Plan Note (Signed)
Follow cbc.  

## 2021-07-16 NOTE — Assessment & Plan Note (Signed)
Blood pressure as outlined.  Will continue low dose amlodipine for now.  Monitor for swelling.  Given dry cough, will change lisinopril to micardis and start micardis 40mg  q day.  Follow pressures.  Follow metabolic panel.

## 2021-07-17 ENCOUNTER — Other Ambulatory Visit: Payer: Self-pay | Admitting: Internal Medicine

## 2021-07-26 ENCOUNTER — Ambulatory Visit: Payer: Medicare Other

## 2021-08-09 ENCOUNTER — Other Ambulatory Visit: Payer: Self-pay | Admitting: Internal Medicine

## 2021-08-09 DIAGNOSIS — Z1231 Encounter for screening mammogram for malignant neoplasm of breast: Secondary | ICD-10-CM

## 2021-08-16 ENCOUNTER — Other Ambulatory Visit: Payer: Self-pay | Admitting: Internal Medicine

## 2021-08-31 ENCOUNTER — Ambulatory Visit
Admission: RE | Admit: 2021-08-31 | Discharge: 2021-08-31 | Disposition: A | Discharge status: Home / Self Care | Payer: Medicare Other | Source: Ambulatory Visit | Attending: Internal Medicine | Admitting: Internal Medicine

## 2021-08-31 ENCOUNTER — Other Ambulatory Visit: Payer: Self-pay

## 2021-08-31 DIAGNOSIS — Z1231 Encounter for screening mammogram for malignant neoplasm of breast: Secondary | ICD-10-CM | POA: Diagnosis not present

## 2021-09-13 ENCOUNTER — Ambulatory Visit: Payer: Medicare Other | Admitting: Internal Medicine

## 2021-10-21 IMAGING — MG DIGITAL SCREENING BILAT W/ TOMO W/ CAD
8 series · 8 of 24 positions shown · non-contrast
Comparison: Previous exam(s).

CLINICAL DATA: Screening.

EXAM:
DIGITAL SCREENING BILATERAL MAMMOGRAM WITH TOMO AND CAD

[R MLO synth-2D]
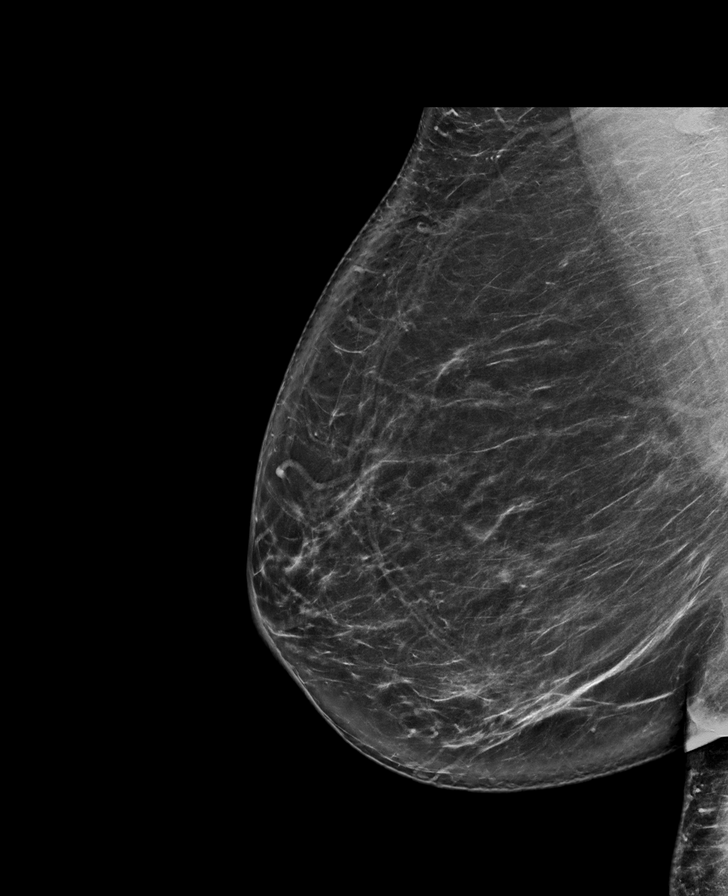

[L MLO synth-2D]
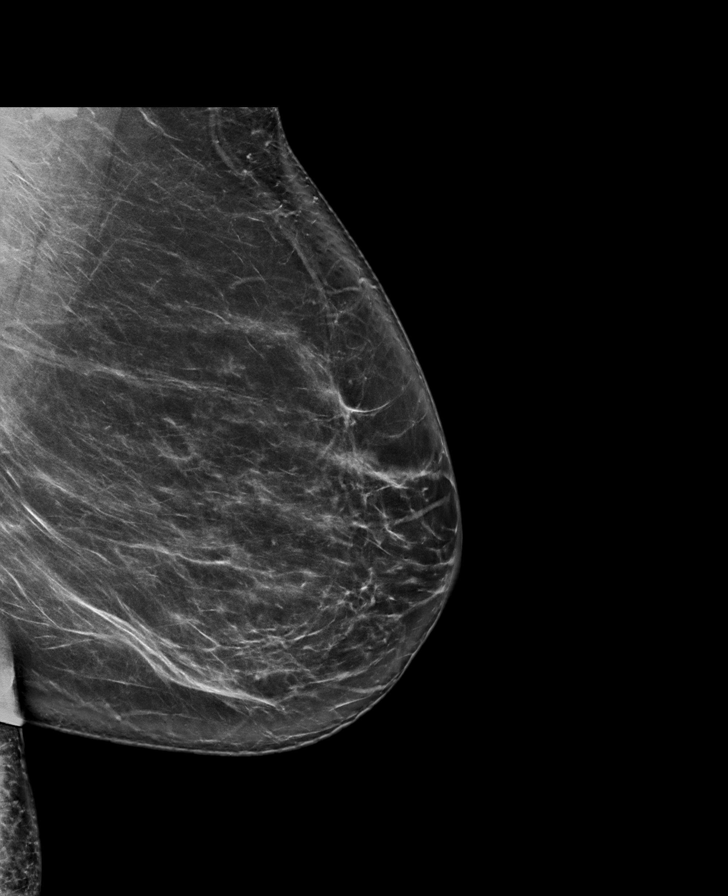

[L CC synth-2D]
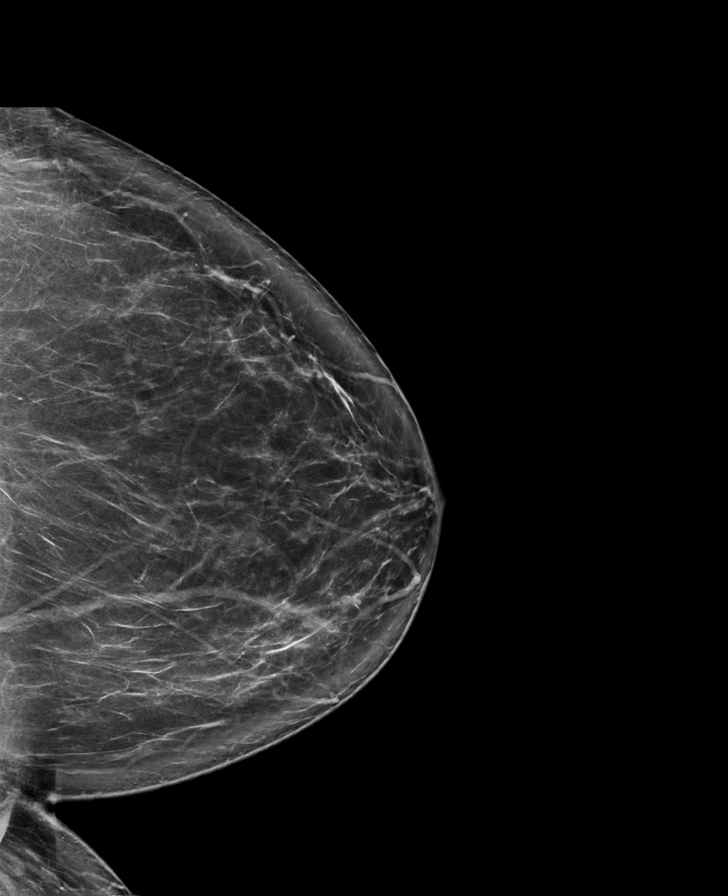

[R CC synth-2D]
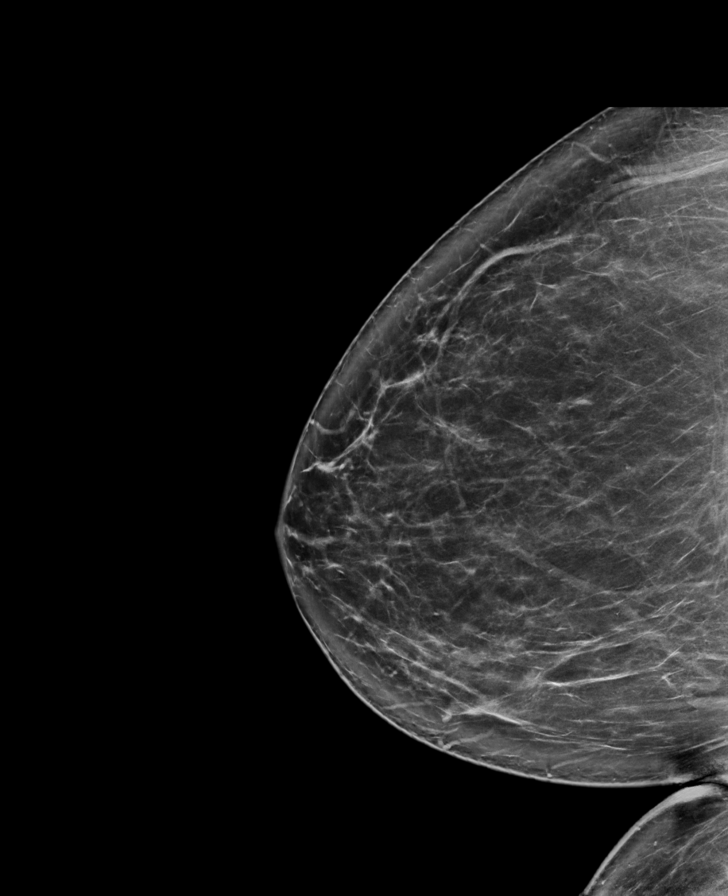

[R MLO tomo · tomo slice 47/94.0]
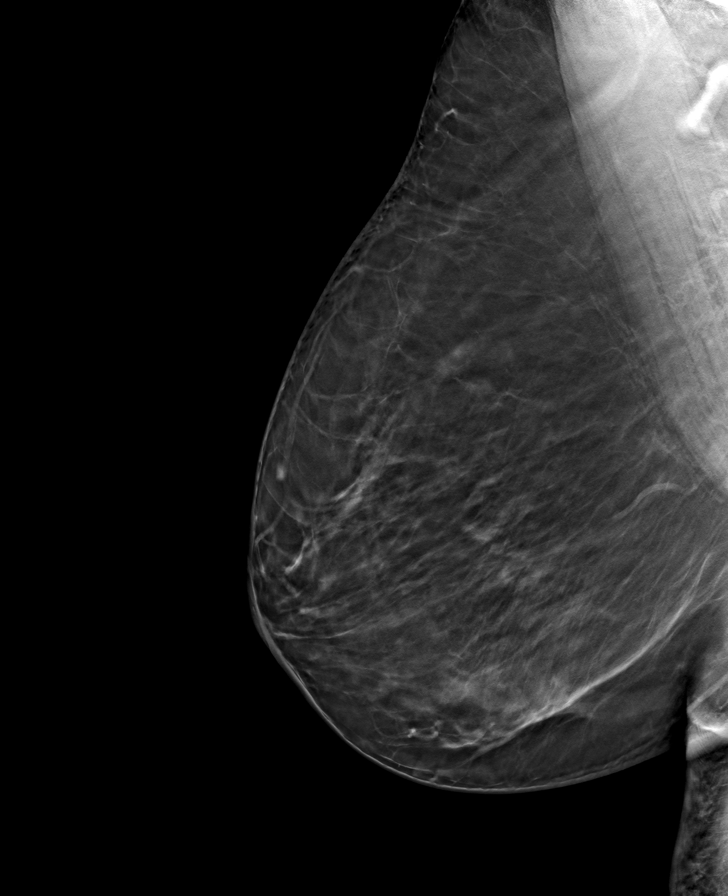

[L MLO tomo · tomo slice 47/93.0]
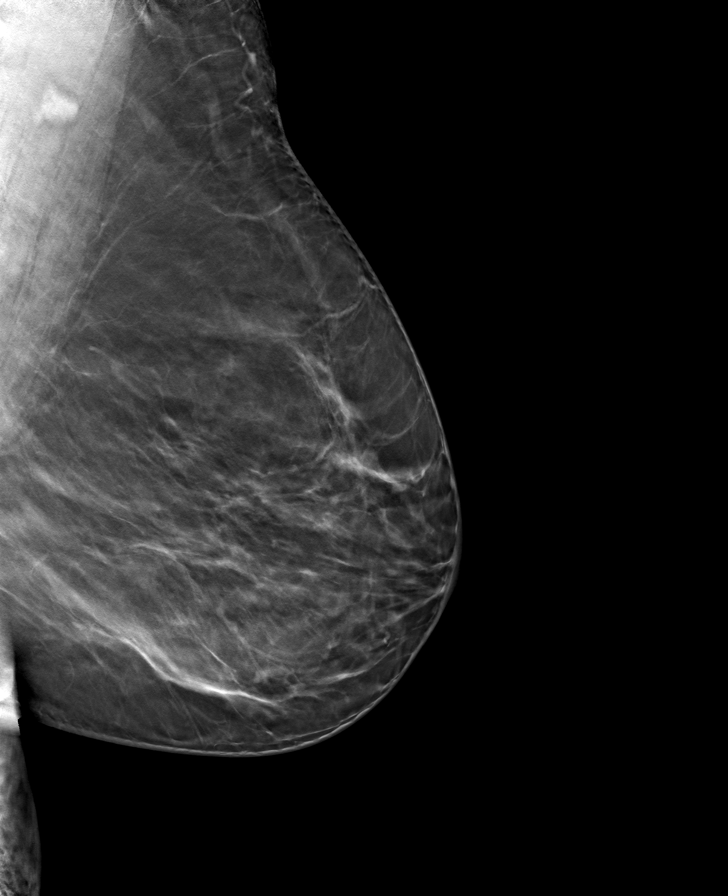

[R CC tomo · tomo slice 47/93.0]
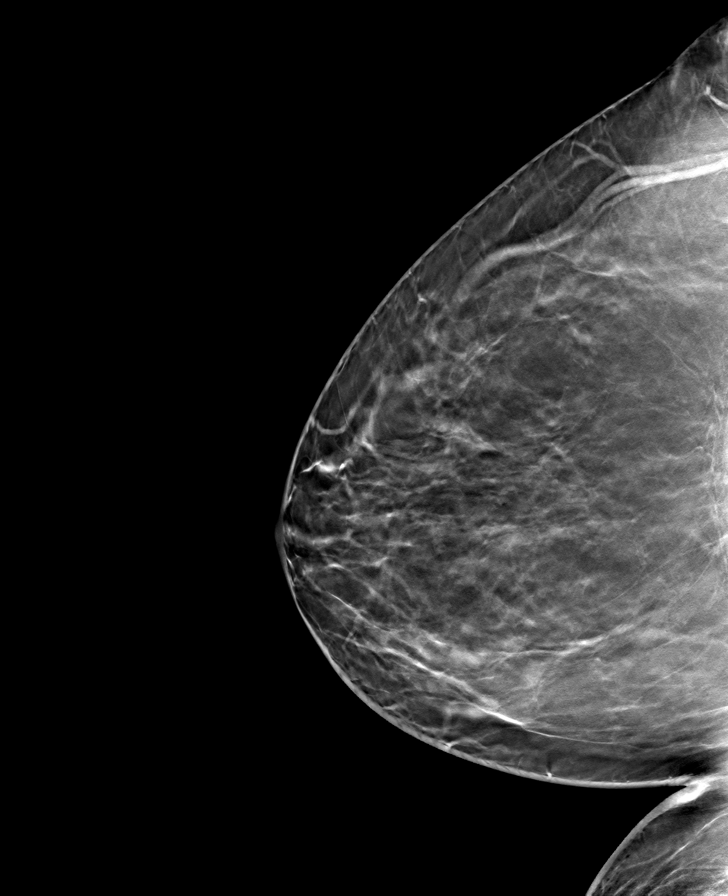

[L CC tomo · tomo slice 47/93.0]
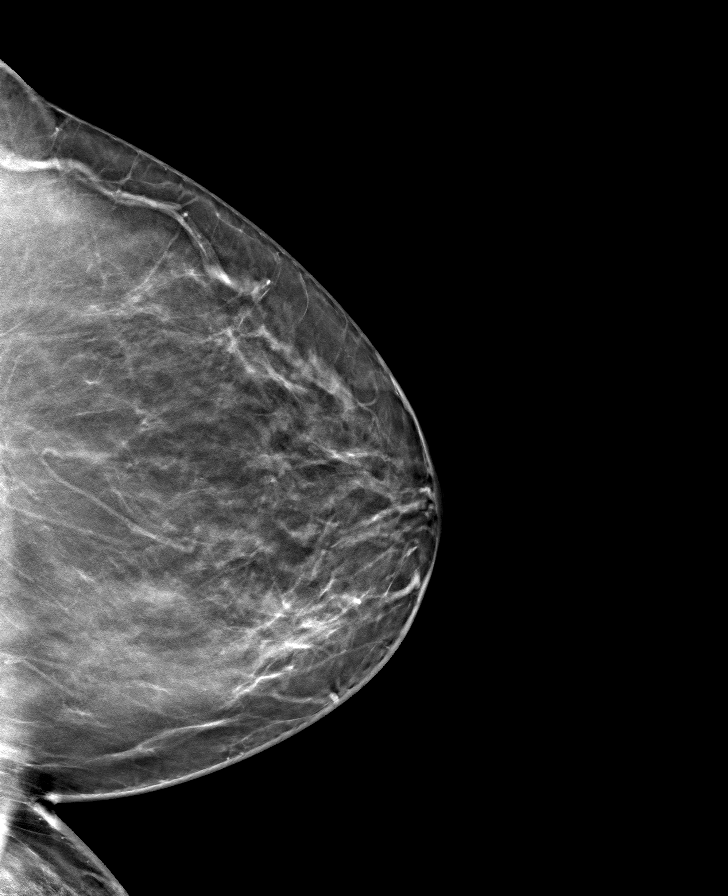

[8 of 24 positions shown; findings below may reference images not displayed]

ACR Breast Density Category b: There are scattered areas of
fibroglandular density.
FINDINGS: There are no findings suspicious for malignancy. Images were
processed with CAD.
IMPRESSION: No mammographic evidence of malignancy. A result letter of this
screening mammogram will be mailed directly to the patient.

RECOMMENDATION:
Screening mammogram in one year. (Code:CN-U-775)

BI-RADS CATEGORY  1: Negative.

## 2021-11-24 ENCOUNTER — Encounter: Payer: Self-pay | Admitting: Internal Medicine

## 2021-11-24 ENCOUNTER — Ambulatory Visit (INDEPENDENT_AMBULATORY_CARE_PROVIDER_SITE_OTHER): Payer: Medicare Other | Admitting: Internal Medicine

## 2021-11-24 VITALS — BP 128/80 | HR 93 | Temp 98.1°F | Resp 16 | Ht 66.0 in | Wt 199.6 lb

## 2021-11-24 DIAGNOSIS — F439 Reaction to severe stress, unspecified: Secondary | ICD-10-CM

## 2021-11-24 DIAGNOSIS — E7800 Pure hypercholesterolemia, unspecified: Secondary | ICD-10-CM

## 2021-11-24 DIAGNOSIS — Z86010 Personal history of colonic polyps: Secondary | ICD-10-CM

## 2021-11-24 DIAGNOSIS — R591 Generalized enlarged lymph nodes: Secondary | ICD-10-CM

## 2021-11-24 DIAGNOSIS — R059 Cough, unspecified: Secondary | ICD-10-CM | POA: Diagnosis not present

## 2021-11-24 DIAGNOSIS — I10 Essential (primary) hypertension: Secondary | ICD-10-CM

## 2021-11-24 DIAGNOSIS — D649 Anemia, unspecified: Secondary | ICD-10-CM

## 2021-11-24 LAB — BASIC METABOLIC PANEL
BUN: 16 mg/dL (ref 6–23)
CO2: 29 mEq/L (ref 19–32)
Calcium: 9.3 mg/dL (ref 8.4–10.5)
Chloride: 106 mEq/L (ref 96–112)
Creatinine, Ser: 1.04 mg/dL (ref 0.40–1.20)
GFR: 53.49 mL/min — ABNORMAL LOW (ref >60.00)
Glucose, Bld: 95 mg/dL (ref 70–99)
Potassium: 4.4 mEq/L (ref 3.5–5.1)
Sodium: 141 mEq/L (ref 135–145)

## 2021-11-24 LAB — CBC WITH DIFFERENTIAL/PLATELET
Basophils Absolute: 0 10*3/uL (ref 0.0–0.1)
Basophils Relative: 0.8 % (ref 0.0–3.0)
Eosinophils Absolute: 0.6 10*3/uL (ref 0.0–0.7)
Eosinophils Relative: 10.7 % — ABNORMAL HIGH (ref 0.0–5.0)
HCT: 39.8 % (ref 36.0–46.0)
Hemoglobin: 13 g/dL (ref 12.0–15.0)
Lymphocytes Relative: 27.4 % (ref 12.0–46.0)
Lymphs Abs: 1.5 10*3/uL (ref 0.7–4.0)
MCHC: 32.6 g/dL (ref 30.0–36.0)
MCV: 86.2 fl (ref 78.0–100.0)
Monocytes Absolute: 0.6 10*3/uL (ref 0.1–1.0)
Monocytes Relative: 11.3 % (ref 3.0–12.0)
Neutro Abs: 2.8 10*3/uL (ref 1.4–7.7)
Neutrophils Relative %: 49.8 % (ref 43.0–77.0)
Platelets: 221 10*3/uL (ref 150.0–400.0)
RBC: 4.62 Mil/uL (ref 3.87–5.11)
RDW: 14.9 % (ref 11.5–15.5)
WBC: 5.5 10*3/uL (ref 4.0–10.5)

## 2021-11-24 LAB — HEPATIC FUNCTION PANEL
ALT: 9 U/L (ref 0–35)
AST: 24 U/L (ref 0–37)
Albumin: 4.1 g/dL (ref 3.5–5.2)
Alkaline Phosphatase: 50 U/L (ref 39–117)
Bilirubin, Direct: 0.1 mg/dL (ref 0.0–0.3)
Total Bilirubin: 0.6 mg/dL (ref 0.2–1.2)
Total Protein: 7.3 g/dL (ref 6.0–8.3)

## 2021-11-24 LAB — LIPID PANEL
Cholesterol: 140 mg/dL (ref 0–200)
HDL: 70.2 mg/dL (ref >39.00)
LDL Cholesterol: 62 mg/dL (ref 0–99)
NonHDL: 69.51
Total CHOL/HDL Ratio: 2
Triglycerides: 36 mg/dL (ref 0.0–149.0)
VLDL: 7.2 mg/dL (ref 0.0–40.0)

## 2021-11-24 MED ORDER — TELMISARTAN 80 MG PO TABS
80.0000 mg | ORAL_TABLET | Freq: Every day | ORAL | 1 refills | Status: DC
Start: 2021-11-24 — End: 2022-03-16

## 2021-11-24 NOTE — Progress Notes (Signed)
Patient ID: ALORA GOREY, female   DOB: [birth year], 70 y.o.   MRN: [id number] ? ? ?Subjective:  ? ? Patient ID: ENAYA HOWZE, female    DOB: Dec 09, 1950, 71 y.o.   MRN: [id number] ? ?This visit occurred during the SARS-CoV-2 public health emergency.  Safety protocols were in place, including screening questions prior to the visit, additional usage of staff PPE, and extensive cleaning of exam room while observing appropriate contact time as indicated for disinfecting solutions.  ? ?Patient here for a scheduled follow up.  ? ?Chief Complaint  ?Patient presents with  ? Follow-up  ?  65mof/u - HTN  ? .  ? ?HPI ?Last visit lisinopril was changed to micardis.  Cough has resolved.  Blood pressure fluctuating - readings in 130s/80s.  No headache or dizziness reported.  No abdominal pain or bowel change reported.  No chest pain or sob.  Steroid nasal spray helped with nasal congestion. Not an issue now.  Some watery eyes at times.  Does not feel needs any further intervention.  Handling stress.   ? ? ?Past Medical History:  ?Diagnosis Date  ? Anemia   ? iron deficient  ? Depression   ? Hyperlipemia   ? Hypertension   ? Vitamin D deficiency   ? ?Past Surgical History:  ?Procedure Laterality Date  ? APPENDECTOMY    ? COLONOSCOPY WITH PROPOFOL N/A 07/15/2015  ? Procedure: COLONOSCOPY WITH PROPOFOL;  Surgeon: PHulen Luster MD;  [facility];  Service: Gastroenterology;  Laterality: N/A;  ? CYSTOSCOPY WITH BIOPSY N/A 04/16/2017  ? Procedure: CYSTOSCOPY WITH BLADDER NECK BIOPSY AND URETHRAL BIOPSY;  Surgeon: BHollice Espy MD;  Location: [facility] ORS;  Service: Urology;  Laterality: N/A;  ? MYOMECTOMY    ? x2  ? [place] ? ?Family History  ?Problem Relation Age of Onset  ? Lung cancer Father   ? Heart disease Mother   ?     congestive heart failure  ? Hypertension Mother   ? Lung cancer Brother   ? Diabetes Brother   ? Colon polyps Sister   ? Breast cancer Neg Hx   ? Prostate cancer Neg Hx   ? Kidney  cancer Neg Hx   ? ?Social History  ? ?Socioeconomic History  ? Marital status: Married  ?  Spouse name: Not on file  ? Number of children: Not on file  ? Years of education: Not on file  ? Highest education level: Not on file  ?Occupational History  ? Not on file  ?Tobacco Use  ? Smoking status: Former  ? Smokeless tobacco: Never  ? Tobacco comments:  ?  quit smoking 1990  ?Vaping Use  ? Vaping Use: Never used  ?Substance and Sexual Activity  ? Alcohol use: Yes  ?  Alcohol/week: 0.0 standard drinks  ?  Comment: glass of wine per day  ? Drug use: No  ? Sexual activity: Not Currently  ?Other Topics Concern  ? Not on file  ?Social History Narrative  ? Not on file  ? ?Social Determinants of Health  ? ?Financial Resource Strain: Not on file  ?Food Insecurity: Not on file  ?Transportation Needs: Not on file  ?Physical Activity: Not on file  ?Stress: Not on file  ?Social Connections: Not on file  ? ? ? ?Review of Systems  ?Constitutional:  Negative for appetite change and unexpected weight change.  ?HENT:  Negative for congestion and sinus pressure.   ?Respiratory:  Negative for cough, chest tightness and shortness of breath.   ?Cardiovascular:  Negative for chest pain, palpitations and leg swelling.  ?Gastrointestinal:  Negative for abdominal pain, diarrhea, nausea and vomiting.  ?Genitourinary:  Negative for difficulty urinating and dysuria.  ?Musculoskeletal:  Negative for joint swelling and myalgias.  ?Skin:  Negative for color change and rash.  ?Neurological:  Negative for dizziness, light-headedness and headaches.  ?Psychiatric/Behavioral:  Negative for agitation and dysphoric mood.   ? ?   ?Objective:  ?  ? ?BP 128/80 (BP Location: Left Arm, Patient Position: Sitting, Cuff Size: Large)   Pulse 93   Temp 98.1 ?F (36.7 ?C) (Temporal)   Resp 16   Ht '5\' 6"'$  (1.676 m)   Wt 199 lb 9.6 oz (90.5 kg)   LMP 10/15/1999   SpO2 99%   BMI 32.22 kg/m?  ?Wt Readings from Last 3 Encounters:  ?11/24/21 199 lb 9.6 oz (90.5 kg)   ?07/15/21 200 lb 6 oz (90.9 kg)  ?04/20/21 206 lb 9.6 oz (93.7 kg)  ? ? ?Physical Exam ?Vitals reviewed.  ?Constitutional:   ?   General: She is not in acute distress. ?   Appearance: Normal appearance.  ?HENT:  ?   Head: Normocephalic and atraumatic.  ?   Right Ear: External ear normal.  ?   Left Ear: External ear normal.  ?Eyes:  ?   General: No scleral icterus.    ?   Right eye: No discharge.     ?   Left eye: No discharge.  ?   Conjunctiva/sclera: Conjunctivae normal.  ?Neck:  ?   Thyroid: No thyromegaly.  ?Cardiovascular:  ?   Rate and Rhythm: Normal rate and regular rhythm.  ?Pulmonary:  ?   Effort: No respiratory distress.  ?   Breath sounds: Normal breath sounds. No wheezing.  ?Abdominal:  ?   General: Bowel sounds are normal.  ?   Palpations: Abdomen is soft.  ?   Tenderness: There is no abdominal tenderness.  ?Musculoskeletal:     ?   General: No swelling or tenderness.  ?   Cervical back: Neck supple. No tenderness.  ?Lymphadenopathy:  ?   Cervical: No cervical adenopathy.  ?Skin: ?   Findings: No erythema or rash.  ?Neurological:  ?   Mental Status: She is alert.  ?Psychiatric:     ?   Mood and Affect: Mood normal.     ?   Behavior: Behavior normal.  ? ? ? ?Outpatient Encounter Medications as of 11/24/2021  ?Medication Sig  ? amLODipine (NORVASC) 2.5 MG tablet TAKE 1 TABLET BY MOUTH EVERY DAY  ? Cholecalciferol (VITAMIN D) 2000 units CAPS Take 2,000 Units by mouth daily.  ? fluticasone (FLONASE) 50 MCG/ACT nasal spray Place 1 spray into both nostrils as needed for allergies or rhinitis. (Patient taking differently: Place 2 sprays into both nostrils as needed for allergies or rhinitis.)  ? rosuvastatin (CRESTOR) 10 MG tablet TAKE 1 TABLET BY MOUTH EVERY DAY  ? telmisartan (MICARDIS) 80 MG tablet Take 1 tablet (80 mg total) by mouth daily.  ? vitamin E 400 UNIT capsule Take 400 Units by mouth daily.  ? [DISCONTINUED] telmisartan (MICARDIS) 40 MG tablet TAKE 1 TABLET BY MOUTH EVERY DAY  ? ?No  facility-administered encounter medications on file as of 11/24/2021.  ?  ? ?Lab Results  ?Component Value Date  ? WBC 5.5 11/24/2021  ? HGB 13.0 11/24/2021  ? HCT 39.8 11/24/2021  ? PLT 221.0 11/24/2021  ?  GLUCOSE 95 11/24/2021  ? CHOL 140 11/24/2021  ? TRIG 36.0 11/24/2021  ? HDL 70.20 11/24/2021  ? [place] 62 11/24/2021  ? ALT 9 11/24/2021  ? AST 24 11/24/2021  ? NA 141 11/24/2021  ? K 4.4 11/24/2021  ? CL 106 11/24/2021  ? CREATININE 1.04 11/24/2021  ? BUN 16 11/24/2021  ? CO2 29 11/24/2021  ? TSH 2.59 04/18/2021  ? ? ?MM 3D SCREEN BREAST BILATERAL ? ?Result Date: 08/31/2021 ?CLINICAL DATA:  Screening. EXAM: DIGITAL SCREENING BILATERAL MAMMOGRAM WITH TOMOSYNTHESIS AND CAD TECHNIQUE: Bilateral screening digital craniocaudal and mediolateral oblique mammograms were obtained. Bilateral screening digital breast tomosynthesis was performed. The images were evaluated with computer-aided detection. COMPARISON:  Previous exam(s). ACR Breast Density Category b: There are scattered areas of fibroglandular density. FINDINGS: There are no findings suspicious for malignancy. IMPRESSION: No mammographic evidence of malignancy. A result letter of this screening mammogram will be mailed directly to the patient. RECOMMENDATION: Screening mammogram in one year. (Code:SM-B-01Y) BI-RADS CATEGORY  1: Negative. Electronically Signed   By: Abelardo Diesel M.D.   On: 08/31/2021 10:26 ? ? ?   ?Assessment & Plan:  ? ?Problem List Items Addressed This Visit   ? ? Anemia  ?  Follow cbc.  ? ?  ?  ? Cough  ?  Off ace inhibitor now.  Resolved.  ? ?  ?  ? Essential hypertension, benign - Primary  ?  Blood pressure as outlined.  Will continue low dose amlodipine.  Monitor for swelling.  Given dry cough, lisinopril was changed to micardis last visit.  Blood pressure still varying.  Discussed goal blood pressure.  Will increase micardis to '80mg'$  q day.  Follow pressures.  Follow metabolic panel.   ? ?  ?  ? Relevant Medications  ? telmisartan  (MICARDIS) 80 MG tablet  ? Other Relevant Orders  ? Basic Metabolic Panel (BMET) (Completed)  ? History of colonic polyps  ?  Colonoscopy 04/12/21 - internal hemorrhoids.  Recommended f/u colonoscopy in 5 years.   ? ?  ?  ? Hypercholest

## 2021-11-24 NOTE — Patient Instructions (Signed)
Increase micardis to '80mg'$  per day ?

## 2021-11-25 ENCOUNTER — Encounter: Payer: Self-pay | Admitting: Internal Medicine

## 2021-11-25 NOTE — Assessment & Plan Note (Signed)
Follow cbc.  

## 2021-11-25 NOTE — Assessment & Plan Note (Signed)
Blood pressure as outlined.  Will continue low dose amlodipine.  Monitor for swelling.  Given dry cough, lisinopril was changed to micardis last visit.  Blood pressure still varying.  Discussed goal blood pressure.  Will increase micardis to '80mg'$  q day.  Follow pressures.  Follow metabolic panel.   ?

## 2021-11-25 NOTE — Assessment & Plan Note (Signed)
Off ace inhibitor now.  Resolved.  ?

## 2021-11-25 NOTE — Assessment & Plan Note (Signed)
Continue crestor.  Low cholesterol diet and exercise. Follow lipid panel and liver function tests.   

## 2021-11-25 NOTE — Assessment & Plan Note (Signed)
Overall appears to be handling things well.  Doing well.  Follow.  ?

## 2021-11-25 NOTE — Assessment & Plan Note (Signed)
Resolved

## 2021-11-25 NOTE — Assessment & Plan Note (Signed)
Colonoscopy 04/12/21 - internal hemorrhoids.  Recommended f/u colonoscopy in 5 years.   

## 2021-12-29 ENCOUNTER — Other Ambulatory Visit: Payer: Self-pay | Admitting: Internal Medicine

## 2022-01-09 DIAGNOSIS — H2513 Age-related nuclear cataract, bilateral: Secondary | ICD-10-CM | POA: Diagnosis not present

## 2022-01-09 DIAGNOSIS — H40053 Ocular hypertension, bilateral: Secondary | ICD-10-CM | POA: Diagnosis not present

## 2022-01-09 DIAGNOSIS — H43813 Vitreous degeneration, bilateral: Secondary | ICD-10-CM | POA: Diagnosis not present

## 2022-01-13 ENCOUNTER — Encounter: Payer: Self-pay | Admitting: Internal Medicine

## 2022-01-16 ENCOUNTER — Other Ambulatory Visit: Payer: Self-pay | Admitting: Internal Medicine

## 2022-01-24 ENCOUNTER — Encounter: Payer: Medicare Other | Admitting: Internal Medicine

## 2022-02-06 ENCOUNTER — Other Ambulatory Visit: Payer: Self-pay | Admitting: Internal Medicine

## 2022-03-16 ENCOUNTER — Encounter: Payer: Self-pay | Admitting: Internal Medicine

## 2022-03-16 ENCOUNTER — Ambulatory Visit (INDEPENDENT_AMBULATORY_CARE_PROVIDER_SITE_OTHER): Payer: Medicare Other | Admitting: Internal Medicine

## 2022-03-16 ENCOUNTER — Ambulatory Visit (INDEPENDENT_AMBULATORY_CARE_PROVIDER_SITE_OTHER): Payer: Medicare Other

## 2022-03-16 VITALS — BP 128/76 | HR 76 | Temp 98.7°F | Resp 16 | Ht 66.0 in | Wt 198.6 lb

## 2022-03-16 DIAGNOSIS — E7800 Pure hypercholesterolemia, unspecified: Secondary | ICD-10-CM

## 2022-03-16 DIAGNOSIS — D649 Anemia, unspecified: Secondary | ICD-10-CM

## 2022-03-16 DIAGNOSIS — Z0000 Encounter for general adult medical examination without abnormal findings: Secondary | ICD-10-CM | POA: Diagnosis not present

## 2022-03-16 DIAGNOSIS — F439 Reaction to severe stress, unspecified: Secondary | ICD-10-CM | POA: Diagnosis not present

## 2022-03-16 DIAGNOSIS — M25561 Pain in right knee: Secondary | ICD-10-CM

## 2022-03-16 DIAGNOSIS — Z86010 Personal history of colonic polyps: Secondary | ICD-10-CM | POA: Diagnosis not present

## 2022-03-16 DIAGNOSIS — I10 Essential (primary) hypertension: Secondary | ICD-10-CM | POA: Diagnosis not present

## 2022-03-16 MED ORDER — TELMISARTAN 80 MG PO TABS: 80.0000 mg | ORAL_TABLET | Freq: Every day | ORAL | 3 refills | Status: DC

## 2022-03-16 NOTE — Progress Notes (Signed)
Patient ID: Janice Floyd, female   DOB: 08/12/1950, 71 y.o.   MRN: [id number]   Subjective:    Patient ID: Janice Floyd, female    DOB: 1950-09-28, 71 y.o.   MRN: [id number]   Patient here for her physical exam.   Chief Complaint  Patient presents with   Follow-up    Yearly CPE   .   HPI Doing well.  Staying active.  Had some pain in her right achilles previously.  This has resolved.  Since, she has noticed increased right knee pain.  Persistent.  Worse after sitting for a while and then getting up.  Also, standing for awhile - aggravates.  No back pain.  No chest pain or sob reported.  No abdominal pain.  Bowels moving.     Past Medical History:  Diagnosis Date   Anemia    iron deficient   Depression    Hyperlipemia    Hypertension    Vitamin D deficiency    Past Surgical History:  Procedure Laterality Date   APPENDECTOMY     COLONOSCOPY WITH PROPOFOL N/A 07/15/2015   Procedure: COLONOSCOPY WITH PROPOFOL;  Surgeon: Hulen Luster, MD;  Location: [facility] ENDOSCOPY;  Service: Gastroenterology;  Laterality: N/A;   CYSTOSCOPY WITH BIOPSY N/A 04/16/2017   Procedure: CYSTOSCOPY WITH BLADDER NECK BIOPSY AND URETHRAL BIOPSY;  Surgeon: Hollice Espy, MD;  Location: [facility] ORS;  Service: Urology;  Laterality: N/A;   MYOMECTOMY     x2   UTERINE FIBROID SURGERY  1978   Family History  Problem Relation Age of Onset   Lung cancer Father    Heart disease Mother        congestive heart failure   Hypertension Mother    Lung cancer Brother    Diabetes Brother    Colon polyps Sister    Breast cancer Neg Hx    Prostate cancer Neg Hx    Kidney cancer Neg Hx    Social History   Socioeconomic History   Marital status: Married    Spouse name: Not on file   Number of children: Not on file   Years of education: Not on file   Highest education level: Not on file  Occupational History   Not on file  Tobacco Use   Smoking status: Former   Smokeless tobacco: Never   Tobacco comments:     quit smoking 1990  Vaping Use   Vaping Use: Never used  Substance and Sexual Activity   Alcohol use: Yes    Alcohol/week: 0.0 standard drinks of alcohol    Comment: glass of wine per day   Drug use: No   Sexual activity: Not Currently  Other Topics Concern   Not on file  Social History Narrative   Not on file   Social Determinants of Health   Financial Resource Strain: Low Risk  (07/23/2020)   Overall Financial Resource Strain (CARDIA)    Difficulty of Paying Living Expenses: Not hard at all  Food Insecurity: No Food Insecurity (07/23/2020)   Hunger Vital Sign    Worried About Running Out of Food in the Last Year: Never true    Ran Out of Food in the Last Year: Never true  Transportation Needs: No Transportation Needs (07/23/2020)   PRAPARE - Hydrologist (Medical): No    Lack of Transportation (Non-Medical): No  Physical Activity: Sufficiently Active (07/23/2020)   Exercise Vital Sign    Days of Exercise per Week: 6  days    Minutes of Exercise per Session: 60 min  Stress: No Stress Concern Present (07/23/2020)   [place]    Feeling of Stress : Not at all  Social Connections: Unknown (07/23/2020)   Social Connection and Isolation Panel [NHANES]    Frequency of Communication with Friends and Family: Not on file    Frequency of Social Gatherings with Friends and Family: Not on file    Attends Religious Services: Not on file    Active Member of Clubs or Organizations: Not on file    Attends Archivist Meetings: Not on file    Marital Status: Married     Review of Systems  Constitutional:  Negative for appetite change and unexpected weight change.  HENT:  Negative for congestion, sinus pressure and sore throat.   Eyes:  Negative for pain and visual disturbance.  Respiratory:  Negative for cough, chest tightness and shortness of breath.   Cardiovascular:  Negative for  chest pain, palpitations and leg swelling.  Gastrointestinal:  Negative for abdominal pain, diarrhea, nausea and vomiting.  Genitourinary:  Negative for difficulty urinating and dysuria.  Musculoskeletal:  Negative for joint swelling and myalgias.  Skin:  Negative for color change and rash.  Neurological:  Negative for dizziness, light-headedness and headaches.  Hematological:  Negative for adenopathy. Does not bruise/bleed easily.  Psychiatric/Behavioral:  Negative for agitation and dysphoric mood.        Objective:     BP 128/76 (BP Location: Left Arm, Patient Position: Sitting, Cuff Size: Small)   Pulse 76   Temp 98.7 F (37.1 C) (Temporal)   Resp 16   Ht '5\' 6"'$  (1.676 m)   Wt 198 lb 9.6 oz (90.1 kg)   LMP 10/15/1999   SpO2 100%   BMI 32.05 kg/m  Wt Readings from Last 3 Encounters:  03/16/22 198 lb 9.6 oz (90.1 kg)  11/24/21 199 lb 9.6 oz (90.5 kg)  07/15/21 200 lb 6 oz (90.9 kg)    Physical Exam Vitals reviewed.  Constitutional:      General: She is not in acute distress.    Appearance: Normal appearance. She is well-developed.  HENT:     Head: Normocephalic and atraumatic.     Right Ear: External ear normal.     Left Ear: External ear normal.  Eyes:     General: No scleral icterus.       Right eye: No discharge.        Left eye: No discharge.     Conjunctiva/sclera: Conjunctivae normal.  Neck:     Thyroid: No thyromegaly.  Cardiovascular:     Rate and Rhythm: Normal rate and regular rhythm.  Pulmonary:     Effort: No tachypnea, accessory muscle usage or respiratory distress.     Breath sounds: Normal breath sounds. No decreased breath sounds or wheezing.  Chest:  Breasts:    Right: No inverted nipple, mass, nipple discharge or tenderness (no axillary adenopathy).     Left: No inverted nipple, mass, nipple discharge or tenderness (no axilarry adenopathy).  Abdominal:     General: Bowel sounds are normal.     Palpations: Abdomen is soft.     Tenderness:  There is no abdominal tenderness.  Musculoskeletal:        General: No swelling or tenderness.     Cervical back: Neck supple.  Lymphadenopathy:     Cervical: No cervical adenopathy.  Skin:    Findings: No  erythema or rash.  Neurological:     Mental Status: She is alert and oriented to person, place, and time.  Psychiatric:        Mood and Affect: Mood normal.        Behavior: Behavior normal.      Outpatient Encounter Medications as of 03/16/2022  Medication Sig   amLODipine (NORVASC) 2.5 MG tablet TAKE 1 TABLET BY MOUTH EVERY DAY   Cholecalciferol (VITAMIN D) 2000 units CAPS Take 2,000 Units by mouth daily.   fluticasone (FLONASE) 50 MCG/ACT nasal spray Place 1 spray into both nostrils as needed for allergies or rhinitis. (Patient taking differently: Place 2 sprays into both nostrils as needed for allergies or rhinitis.)   rosuvastatin (CRESTOR) 10 MG tablet TAKE 1 TABLET BY MOUTH EVERY DAY   vitamin E 400 UNIT capsule Take 400 Units by mouth daily.   [DISCONTINUED] telmisartan (MICARDIS) 80 MG tablet Take 1 tablet (80 mg total) by mouth daily.   telmisartan (MICARDIS) 80 MG tablet Take 1 tablet (80 mg total) by mouth daily.   No facility-administered encounter medications on file as of 03/16/2022.     Lab Results  Component Value Date   WBC 5.5 11/24/2021   HGB 13.0 11/24/2021   HCT 39.8 11/24/2021   PLT 221.0 11/24/2021   GLUCOSE 95 11/24/2021   CHOL 140 11/24/2021   TRIG 36.0 11/24/2021   HDL 70.20 11/24/2021   LDLCALC 62 11/24/2021   ALT 9 11/24/2021   AST 24 11/24/2021   NA 141 11/24/2021   K 4.4 11/24/2021   CL 106 11/24/2021   CREATININE 1.04 11/24/2021   BUN 16 11/24/2021   CO2 29 11/24/2021   TSH 2.59 04/18/2021    MM 3D SCREEN BREAST BILATERAL  Result Date: 08/31/2021 CLINICAL DATA:  Screening. EXAM: DIGITAL SCREENING BILATERAL MAMMOGRAM WITH TOMOSYNTHESIS AND CAD TECHNIQUE: Bilateral screening digital craniocaudal and mediolateral oblique mammograms  were obtained. Bilateral screening digital breast tomosynthesis was performed. The images were evaluated with computer-aided detection. COMPARISON:  Previous exam(s). ACR Breast Density Category b: There are scattered areas of fibroglandular density. FINDINGS: There are no findings suspicious for malignancy. IMPRESSION: No mammographic evidence of malignancy. A result letter of this screening mammogram will be mailed directly to the patient. RECOMMENDATION: Screening mammogram in one year. (Code:SM-B-01Y) BI-RADS CATEGORY  1: Negative. Electronically Signed   By: Abelardo Diesel M.D.   On: 08/31/2021 10:26      Assessment & Plan:   Problem List Items Addressed This Visit     Anemia    Follow cbc.       Relevant Orders   CBC w/Diff   Essential hypertension, benign    Continue on amlodipine and micardis.  Blood pressure as outlined.  Continue current medications.  Follow pressures.  Follow metabolic panel.       Relevant Medications   telmisartan (MICARDIS) 80 MG tablet   Other Relevant Orders   Basic Metabolic Panel (BMET)   Health care maintenance    Physical today 03/16/22.  Colonoscopy 04/2021 - recommended f/u in 5 years.  Mammogram 08/31/21 - Birads I      History of colonic polyps    Colonoscopy 04/12/21 - internal hemorrhoids.  Recommended f/u colonoscopy in 5 years.        Hypercholesterolemia    Continue crestor.  Low cholesterol diet and exercise.  Follow lipid panel and liver function tests.        Relevant Medications   telmisartan (MICARDIS) 80 MG tablet  Other Relevant Orders   Hepatic function panel   Lipid Profile   TSH   Right knee pain    Persistent knee pain as outlined.  Check xray.  Further w/up pending results.        Relevant Orders   DG Knee 1-2 Views Right (Completed)   Stress    Doing well.  Follow.       Other Visit Diagnoses     Routine general medical examination at a health care facility    -  Primary        Einar Pheasant, MD

## 2022-03-16 NOTE — Assessment & Plan Note (Signed)
Physical today 03/16/22.  Colonoscopy 04/2021 - recommended f/u in 5 years.  Mammogram 08/31/21 - Birads I

## 2022-03-26 ENCOUNTER — Encounter: Payer: Self-pay | Admitting: Internal Medicine

## 2022-03-26 NOTE — Assessment & Plan Note (Signed)
Colonoscopy 04/12/21 - internal hemorrhoids.  Recommended f/u colonoscopy in 5 years.   

## 2022-03-26 NOTE — Assessment & Plan Note (Signed)
Follow cbc.  

## 2022-03-26 NOTE — Assessment & Plan Note (Signed)
Doing well.  Follow.  

## 2022-03-26 NOTE — Assessment & Plan Note (Signed)
Continue crestor.  Low cholesterol diet and exercise. Follow lipid panel and liver function tests.   

## 2022-03-26 NOTE — Assessment & Plan Note (Signed)
Persistent knee pain as outlined.  Check xray.  Further w/up pending results.

## 2022-03-26 NOTE — Assessment & Plan Note (Signed)
Continue on amlodipine and micardis.  Blood pressure as outlined.  Continue current medications.  Follow pressures.  Follow metabolic panel.

## 2022-05-09 ENCOUNTER — Other Ambulatory Visit: Payer: Self-pay | Admitting: Internal Medicine

## 2022-07-14 ENCOUNTER — Other Ambulatory Visit (INDEPENDENT_AMBULATORY_CARE_PROVIDER_SITE_OTHER): Payer: Medicare Other

## 2022-07-14 DIAGNOSIS — D649 Anemia, unspecified: Secondary | ICD-10-CM

## 2022-07-14 DIAGNOSIS — E7800 Pure hypercholesterolemia, unspecified: Secondary | ICD-10-CM

## 2022-07-14 DIAGNOSIS — I10 Essential (primary) hypertension: Secondary | ICD-10-CM

## 2022-07-14 LAB — CBC WITH DIFFERENTIAL/PLATELET
Basophils Absolute: 0 10*3/uL (ref 0.0–0.1)
Basophils Relative: 0.6 % (ref 0.0–3.0)
Eosinophils Absolute: 0.5 10*3/uL (ref 0.0–0.7)
Eosinophils Relative: 7.9 % — ABNORMAL HIGH (ref 0.0–5.0)
HCT: 41.4 % (ref 36.0–46.0)
Hemoglobin: 13.5 g/dL (ref 12.0–15.0)
Lymphocytes Relative: 26.9 % (ref 12.0–46.0)
Lymphs Abs: 1.6 10*3/uL (ref 0.7–4.0)
MCHC: 32.6 g/dL (ref 30.0–36.0)
MCV: 87.1 fl (ref 78.0–100.0)
Monocytes Absolute: 0.7 10*3/uL (ref 0.1–1.0)
Monocytes Relative: 11.2 % (ref 3.0–12.0)
Neutro Abs: 3.2 10*3/uL (ref 1.4–7.7)
Neutrophils Relative %: 53.4 % (ref 43.0–77.0)
Platelets: 237 10*3/uL (ref 150.0–400.0)
RBC: 4.76 Mil/uL (ref 3.87–5.11)
RDW: 15 % (ref 11.5–15.5)
WBC: 6 10*3/uL (ref 4.0–10.5)

## 2022-07-14 LAB — HEPATIC FUNCTION PANEL
ALT: 9 U/L (ref 0–35)
AST: 20 U/L (ref 0–37)
Albumin: 4.2 g/dL (ref 3.5–5.2)
Alkaline Phosphatase: 53 U/L (ref 39–117)
Bilirubin, Direct: 0.2 mg/dL (ref 0.0–0.3)
Total Bilirubin: 0.9 mg/dL (ref 0.2–1.2)
Total Protein: 7.1 g/dL (ref 6.0–8.3)

## 2022-07-14 LAB — BASIC METABOLIC PANEL
BUN: 16 mg/dL (ref 6–23)
CO2: 30 mEq/L (ref 19–32)
Calcium: 9.4 mg/dL (ref 8.4–10.5)
Chloride: 103 mEq/L (ref 96–112)
Creatinine, Ser: 1.02 mg/dL (ref 0.40–1.20)
GFR: 54.5 mL/min — ABNORMAL LOW (ref >60.00)
Glucose, Bld: 86 mg/dL (ref 70–99)
Potassium: 4.2 mEq/L (ref 3.5–5.1)
Sodium: 139 mEq/L (ref 135–145)

## 2022-07-14 LAB — LIPID PANEL
Cholesterol: 162 mg/dL (ref 0–200)
HDL: 72.3 mg/dL (ref >39.00)
LDL Cholesterol: 80 mg/dL (ref 0–99)
NonHDL: 89.47
Total CHOL/HDL Ratio: 2
Triglycerides: 46 mg/dL (ref 0.0–149.0)
VLDL: 9.2 mg/dL (ref 0.0–40.0)

## 2022-07-14 LAB — TSH: TSH: 2.77 u[IU]/mL (ref 0.35–5.50)

## 2022-07-17 ENCOUNTER — Other Ambulatory Visit: Payer: Self-pay | Admitting: Internal Medicine

## 2022-07-19 ENCOUNTER — Ambulatory Visit (INDEPENDENT_AMBULATORY_CARE_PROVIDER_SITE_OTHER): Payer: Medicare Other | Admitting: Internal Medicine

## 2022-07-19 ENCOUNTER — Encounter: Payer: Self-pay | Admitting: Internal Medicine

## 2022-07-19 VITALS — BP 134/80 | HR 84 | Temp 98.3°F | Resp 15 | Ht 66.0 in | Wt 205.4 lb

## 2022-07-19 DIAGNOSIS — E7800 Pure hypercholesterolemia, unspecified: Secondary | ICD-10-CM

## 2022-07-19 DIAGNOSIS — Z86010 Personal history of colonic polyps: Secondary | ICD-10-CM | POA: Diagnosis not present

## 2022-07-19 DIAGNOSIS — I10 Essential (primary) hypertension: Secondary | ICD-10-CM | POA: Diagnosis not present

## 2022-07-19 DIAGNOSIS — D649 Anemia, unspecified: Secondary | ICD-10-CM

## 2022-07-19 DIAGNOSIS — Z1231 Encounter for screening mammogram for malignant neoplasm of breast: Secondary | ICD-10-CM

## 2022-07-19 MED ORDER — ROSUVASTATIN CALCIUM 10 MG PO TABS: 10.0000 mg | ORAL_TABLET | Freq: Every day | ORAL | 1 refills | Status: DC

## 2022-07-19 NOTE — Progress Notes (Signed)
Patient ID: Janice Floyd, female   DOB: 08/04/1951, 71 y.o.   MRN: [id number]   Subjective:    Patient ID: Janice Floyd, female    DOB: [birth year], 71 y.o.   MRN: [id number]   Patient here for a scheduled follow up.   Chief Complaint  Patient presents with   Hypertension   .   HPI Here to follow up regarding her blood pressure. On amlodipine and micardis. Last visit, noticed right knee pain.  Xray - degenerative changes.  No pain now.  Stays active.  No headache or dizziness reported.  No abdominal pain or bowel change reported.  No chest pain or sob. Handling stress.  Overall feels good.  Taking magnesium.    Past Medical History:  Diagnosis Date   Anemia    iron deficient   Depression    Hyperlipemia    Hypertension    Vitamin D deficiency    Past Surgical History:  Procedure Laterality Date   APPENDECTOMY     COLONOSCOPY WITH PROPOFOL N/A 07/15/2015   Procedure: COLONOSCOPY WITH PROPOFOL;  Surgeon: Hulen Luster, MD;  Location: [facility] ENDOSCOPY;  Service: Gastroenterology;  Laterality: N/A;   CYSTOSCOPY WITH BIOPSY N/A 04/16/2017   Procedure: CYSTOSCOPY WITH BLADDER NECK BIOPSY AND URETHRAL BIOPSY;  Surgeon: Hollice Espy, MD;  Location: [facility] ORS;  Service: Urology;  Laterality: N/A;   MYOMECTOMY     x2   UTERINE FIBROID SURGERY  1978   Family History  Problem Relation Age of Onset   Lung cancer Father    Heart disease Mother        congestive heart failure   Hypertension Mother    Lung cancer Brother    Diabetes Brother    Colon polyps Sister    Breast cancer Neg Hx    Prostate cancer Neg Hx    Kidney cancer Neg Hx    Social History   Socioeconomic History   Marital status: Married    Spouse name: Not on file   Number of children: Not on file   Years of education: Not on file   Highest education level: Not on file  Occupational History   Not on file  Tobacco Use   Smoking status: Former   Smokeless tobacco: Never   Tobacco comments:    quit smoking 1990   Vaping Use   Vaping Use: Never used  Substance and Sexual Activity   Alcohol use: Yes    Alcohol/week: 0.0 standard drinks of alcohol    Comment: glass of wine per day   Drug use: No   Sexual activity: Not Currently  Other Topics Concern   Not on file  Social History Narrative   Not on file   Social Determinants of Health   Financial Resource Strain: Low Risk  (07/23/2020)   Overall Financial Resource Strain (CARDIA)    Difficulty of Paying Living Expenses: Not hard at all  Food Insecurity: No Food Insecurity (07/23/2020)   Hunger Vital Sign    Worried About Running Out of Food in the Last Year: Never true    Ran Out of Food in the Last Year: Never true  Transportation Needs: No Transportation Needs (07/23/2020)   PRAPARE - Hydrologist (Medical): No    Lack of Transportation (Non-Medical): No  Physical Activity: Sufficiently Active (07/23/2020)   Exercise Vital Sign    Days of Exercise per Week: 6 days    Minutes of Exercise per Session: 60 min  Stress: No Stress Concern Present (07/23/2020)   [place]    Feeling of Stress : Not at all  Social Connections: Unknown (07/23/2020)   Social Connection and Isolation Panel [NHANES]    Frequency of Communication with Friends and Family: Not on file    Frequency of Social Gatherings with Friends and Family: Not on file    Attends Religious Services: Not on file    Active Member of Clubs or Organizations: Not on file    Attends Archivist Meetings: Not on file    Marital Status: Married     Review of Systems  Constitutional:  Negative for appetite change and unexpected weight change.  HENT:  Negative for congestion and sinus pressure.   Respiratory:  Negative for cough, chest tightness and shortness of breath.   Cardiovascular:  Negative for chest pain, palpitations and leg swelling.  Gastrointestinal:  Negative for  abdominal pain, diarrhea, nausea and vomiting.  Genitourinary:  Negative for difficulty urinating and dysuria.  Musculoskeletal:  Negative for joint swelling and myalgias.  Skin:  Negative for color change and rash.  Neurological:  Negative for dizziness, light-headedness and headaches.  Psychiatric/Behavioral:  Negative for agitation and dysphoric mood.        Objective:     BP 134/80 (BP Location: Left Arm, Patient Position: Sitting, Cuff Size: Large)   Pulse 84   Temp 98.3 F (36.8 C) (Temporal)   Resp 15   Ht '5\' 6"'$  (1.676 m)   Wt 205 lb 6.4 oz (93.2 kg)   LMP 10/15/1999   SpO2 97%   BMI 33.15 kg/m  Wt Readings from Last 3 Encounters:  07/19/22 205 lb 6.4 oz (93.2 kg)  03/16/22 198 lb 9.6 oz (90.1 kg)  11/24/21 199 lb 9.6 oz (90.5 kg)    Physical Exam Vitals reviewed.  Constitutional:      General: She is not in acute distress.    Appearance: Normal appearance.  HENT:     Head: Normocephalic and atraumatic.     Right Ear: External ear normal.     Left Ear: External ear normal.  Eyes:     General: No scleral icterus.       Right eye: No discharge.        Left eye: No discharge.     Conjunctiva/sclera: Conjunctivae normal.  Neck:     Thyroid: No thyromegaly.  Cardiovascular:     Rate and Rhythm: Normal rate and regular rhythm.  Pulmonary:     Effort: No respiratory distress.     Breath sounds: Normal breath sounds. No wheezing.  Abdominal:     General: Bowel sounds are normal.     Palpations: Abdomen is soft.     Tenderness: There is no abdominal tenderness.  Musculoskeletal:        General: No swelling or tenderness.     Cervical back: Neck supple. No tenderness.  Lymphadenopathy:     Cervical: No cervical adenopathy.  Skin:    Findings: No erythema or rash.  Neurological:     Mental Status: She is alert.  Psychiatric:        Mood and Affect: Mood normal.        Behavior: Behavior normal.      Outpatient Encounter Medications as of 07/19/2022   Medication Sig   amLODipine (NORVASC) 2.5 MG tablet TAKE 1 TABLET BY MOUTH EVERY DAY   Cholecalciferol (VITAMIN D) 2000 units CAPS Take 2,000 Units by  mouth daily.   fluticasone (FLONASE) 50 MCG/ACT nasal spray Place 1 spray into both nostrils as needed for allergies or rhinitis. (Patient taking differently: Place 2 sprays into both nostrils as needed for allergies or rhinitis.)   telmisartan (MICARDIS) 80 MG tablet Take 1 tablet (80 mg total) by mouth daily.   vitamin E 400 UNIT capsule Take 400 Units by mouth daily.   [DISCONTINUED] rosuvastatin (CRESTOR) 10 MG tablet TAKE 1 TABLET BY MOUTH EVERY DAY   rosuvastatin (CRESTOR) 10 MG tablet Take 1 tablet (10 mg total) by mouth daily.   No facility-administered encounter medications on file as of 07/19/2022.     Lab Results  Component Value Date   WBC 6.0 07/14/2022   HGB 13.5 07/14/2022   HCT 41.4 07/14/2022   PLT 237.0 07/14/2022   GLUCOSE 86 07/14/2022   CHOL 162 07/14/2022   TRIG 46.0 07/14/2022   HDL 72.30 07/14/2022   LDLCALC 80 07/14/2022   ALT 9 07/14/2022   AST 20 07/14/2022   NA 139 07/14/2022   K 4.2 07/14/2022   CL 103 07/14/2022   CREATININE 1.02 07/14/2022   BUN 16 07/14/2022   CO2 30 07/14/2022   TSH 2.77 07/14/2022    MM 3D SCREEN BREAST BILATERAL  Result Date: 08/31/2021 CLINICAL DATA:  Screening. EXAM: DIGITAL SCREENING BILATERAL MAMMOGRAM WITH TOMOSYNTHESIS AND CAD TECHNIQUE: Bilateral screening digital craniocaudal and mediolateral oblique mammograms were obtained. Bilateral screening digital breast tomosynthesis was performed. The images were evaluated with computer-aided detection. COMPARISON:  Previous exam(s). ACR Breast Density Category b: There are scattered areas of fibroglandular density. FINDINGS: There are no findings suspicious for malignancy. IMPRESSION: No mammographic evidence of malignancy. A result letter of this screening mammogram will be mailed directly to the patient. RECOMMENDATION:  Screening mammogram in one year. (Code:SM-B-01Y) BI-RADS CATEGORY  1: Negative. Electronically Signed   By: Abelardo Diesel M.D.   On: 08/31/2021 10:26      Assessment & Plan:   Problem List Items Addressed This Visit     Anemia    Follow cbc.  Recent hgb wnl.       Essential hypertension, benign    Continue on amlodipine and micardis.  Blood pressure as outlined.  Outside checks averaging 120s/70s. Continue current medications.  Follow pressures.  Follow metabolic panel.       Relevant Medications   rosuvastatin (CRESTOR) 10 MG tablet   Other Relevant Orders   Basic Metabolic Panel (BMET)   History of colonic polyps    Colonoscopy 04/12/21 - internal hemorrhoids.  Recommended f/u colonoscopy in 5 years.        Hypercholesterolemia    Continue crestor.  Low cholesterol diet and exercise.  Follow lipid panel and liver function tests.        Relevant Medications   rosuvastatin (CRESTOR) 10 MG tablet   Other Relevant Orders   Hepatic function panel   Lipid Profile   Other Visit Diagnoses     Encounter for screening mammogram for malignant neoplasm of breast    -  Primary   Relevant Orders   MM 3D SCREEN BREAST BILATERAL       Einar Pheasant, MD

## 2022-07-19 NOTE — Assessment & Plan Note (Signed)
Colonoscopy 04/12/21 - internal hemorrhoids.  Recommended f/u colonoscopy in 5 years.

## 2022-07-19 NOTE — Assessment & Plan Note (Signed)
Continue crestor.  Low cholesterol diet and exercise. Follow lipid panel and liver function tests.   

## 2022-07-19 NOTE — Assessment & Plan Note (Addendum)
Continue on amlodipine and micardis.  Blood pressure as outlined.  Outside checks averaging 120s/70s. Continue current medications.  Follow pressures.  Follow metabolic panel.

## 2022-07-19 NOTE — Assessment & Plan Note (Signed)
Follow cbc.  Recent hgb wnl.  

## 2022-09-04 ENCOUNTER — Ambulatory Visit
Admission: RE | Admit: 2022-09-04 | Discharge: 2022-09-04 | Disposition: A | Discharge status: Home / Self Care | Payer: Medicare Other | Source: Ambulatory Visit | Attending: Internal Medicine | Admitting: Internal Medicine

## 2022-09-04 DIAGNOSIS — Z1231 Encounter for screening mammogram for malignant neoplasm of breast: Secondary | ICD-10-CM | POA: Insufficient documentation

## 2022-11-16 ENCOUNTER — Other Ambulatory Visit (INDEPENDENT_AMBULATORY_CARE_PROVIDER_SITE_OTHER): Payer: Medicare Other

## 2022-11-16 DIAGNOSIS — I10 Essential (primary) hypertension: Secondary | ICD-10-CM

## 2022-11-16 DIAGNOSIS — E7800 Pure hypercholesterolemia, unspecified: Secondary | ICD-10-CM | POA: Diagnosis not present

## 2022-11-17 LAB — LIPID PANEL
Cholesterol: 149 mg/dL (ref 0–200)
HDL: 75.9 mg/dL (ref >39.00)
LDL Cholesterol: 66 mg/dL (ref 0–99)
NonHDL: 73.59
Total CHOL/HDL Ratio: 2
Triglycerides: 38 mg/dL (ref 0.0–149.0)
VLDL: 7.6 mg/dL (ref 0.0–40.0)

## 2022-11-17 LAB — BASIC METABOLIC PANEL
BUN: 16 mg/dL (ref 6–23)
CO2: 28 mEq/L (ref 19–32)
Calcium: 9.3 mg/dL (ref 8.4–10.5)
Chloride: 103 mEq/L (ref 96–112)
Creatinine, Ser: 1.08 mg/dL (ref 0.40–1.20)
GFR: 50.77 mL/min — ABNORMAL LOW (ref >60.00)
Glucose, Bld: 83 mg/dL (ref 70–99)
Potassium: 4.5 mEq/L (ref 3.5–5.1)
Sodium: 139 mEq/L (ref 135–145)

## 2022-11-17 LAB — HEPATIC FUNCTION PANEL
ALT: 10 U/L (ref 0–35)
AST: 22 U/L (ref 0–37)
Albumin: 4.1 g/dL (ref 3.5–5.2)
Alkaline Phosphatase: 55 U/L (ref 39–117)
Bilirubin, Direct: 0.1 mg/dL (ref 0.0–0.3)
Total Bilirubin: 0.5 mg/dL (ref 0.2–1.2)
Total Protein: 7.3 g/dL (ref 6.0–8.3)

## 2022-11-20 ENCOUNTER — Encounter: Payer: Self-pay | Admitting: Internal Medicine

## 2022-11-20 ENCOUNTER — Ambulatory Visit (INDEPENDENT_AMBULATORY_CARE_PROVIDER_SITE_OTHER): Payer: Medicare Other | Admitting: Internal Medicine

## 2022-11-20 VITALS — BP 128/74 | HR 89 | Temp 98.3°F | Resp 16 | Ht 65.0 in | Wt 206.0 lb

## 2022-11-20 DIAGNOSIS — F439 Reaction to severe stress, unspecified: Secondary | ICD-10-CM

## 2022-11-20 DIAGNOSIS — I10 Essential (primary) hypertension: Secondary | ICD-10-CM

## 2022-11-20 DIAGNOSIS — E7800 Pure hypercholesterolemia, unspecified: Secondary | ICD-10-CM

## 2022-11-20 DIAGNOSIS — D649 Anemia, unspecified: Secondary | ICD-10-CM

## 2022-11-20 DIAGNOSIS — Z86010 Personal history of colonic polyps: Secondary | ICD-10-CM

## 2022-11-20 MED ORDER — TELMISARTAN 80 MG PO TABS: 80.0000 mg | ORAL_TABLET | Freq: Every day | ORAL | 3 refills | Status: DC

## 2022-11-20 MED ORDER — AMLODIPINE BESYLATE 2.5 MG PO TABS: 2.5000 mg | ORAL_TABLET | Freq: Every day | ORAL | 3 refills | Status: DC

## 2022-11-20 MED ORDER — ROSUVASTATIN CALCIUM 10 MG PO TABS: 10.0000 mg | ORAL_TABLET | Freq: Every day | ORAL | 3 refills | Status: DC

## 2022-11-20 NOTE — Progress Notes (Signed)
Subjective:    Patient ID: Janice Floyd, female    DOB: May 31, 1951, 72 y.o.   MRN: [id number]  Patient here for  Chief Complaint  Patient presents with   Medical Management of Chronic Issues    HPI Here to follow up regarding her blood pressure. On amlodipine and micardis.  Reports blood pressure is doing well.  Home blood pressure readings - 120's/70-82.  No chest pain.  Exercises.  Also yoga 2 days per week.  No sob.  Some allergy - eyes.  No chest congestion or cough.  No abdominal pain or bowel change.     Past Medical History:  Diagnosis Date   Anemia    iron deficient   Depression    Hyperlipemia    Hypertension    Vitamin D deficiency    Past Surgical History:  Procedure Laterality Date   APPENDECTOMY     COLONOSCOPY WITH PROPOFOL N/A 07/15/2015   Procedure: COLONOSCOPY WITH PROPOFOL;  Surgeon: Wallace Cullens, MD;  Location: [facility] ENDOSCOPY;  Service: Gastroenterology;  Laterality: N/A;   CYSTOSCOPY WITH BIOPSY N/A 04/16/2017   Procedure: CYSTOSCOPY WITH BLADDER NECK BIOPSY AND URETHRAL BIOPSY;  Surgeon: Vanna Scotland, MD;  Location: [facility] ORS;  Service: Urology;  Laterality: N/A;   MYOMECTOMY     x2   UTERINE FIBROID SURGERY  1978   Family History  Problem Relation Age of Onset   Lung cancer Father    Heart disease Mother        congestive heart failure   Hypertension Mother    Lung cancer Brother    Diabetes Brother    Colon polyps Sister    Breast cancer Neg Hx    Prostate cancer Neg Hx    Kidney cancer Neg Hx    Social History   Socioeconomic History   Marital status: Married    Spouse name: Not on file   Number of children: Not on file   Years of education: Not on file   Highest education level: Not on file  Occupational History   Not on file  Tobacco Use   Smoking status: Former   Smokeless tobacco: Never   Tobacco comments:    quit smoking 1990  Vaping Use   Vaping Use: Never used  Substance and Sexual Activity   Alcohol use: Yes     Alcohol/week: 0.0 standard drinks of alcohol    Comment: glass of wine per day   Drug use: No   Sexual activity: Not Currently  Other Topics Concern   Not on file  Social History Narrative   Not on file   Social Determinants of Health   Financial Resource Strain: Low Risk  (07/23/2020)   Overall Financial Resource Strain (CARDIA)    Difficulty of Paying Living Expenses: Not hard at all  Food Insecurity: No Food Insecurity (07/23/2020)   Hunger Vital Sign    Worried About Running Out of Food in the Last Year: Never true    Ran Out of Food in the Last Year: Never true  Transportation Needs: No Transportation Needs (07/23/2020)   PRAPARE - Administrator, Civil Service (Medical): No    Lack of Transportation (Non-Medical): No  Physical Activity: Sufficiently Active (07/23/2020)   Exercise Vital Sign    Days of Exercise per Week: 6 days    Minutes of Exercise per Session: 60 min  Stress: No Stress Concern Present (07/23/2020)   [employer] of Occupational Health - Occupational Stress Questionnaire  Feeling of Stress : Not at all  Social Connections: Unknown (07/23/2020)   Social Connection and Isolation Panel [NHANES]    Frequency of Communication with Friends and Family: Not on file    Frequency of Social Gatherings with Friends and Family: Not on file    Attends Religious Services: Not on file    Active Member of Clubs or Organizations: Not on file    Attends Banker Meetings: Not on file    Marital Status: Married     Review of Systems  Constitutional:  Negative for appetite change and unexpected weight change.  HENT:  Negative for congestion and sinus pressure.   Respiratory:  Negative for cough, chest tightness and shortness of breath.   Cardiovascular:  Negative for chest pain, palpitations and leg swelling.  Gastrointestinal:  Negative for abdominal pain, diarrhea, nausea and vomiting.  Genitourinary:  Negative for difficulty urinating  and dysuria.  Musculoskeletal:  Negative for joint swelling and myalgias.  Skin:  Negative for color change and rash.  Neurological:  Negative for dizziness and headaches.  Psychiatric/Behavioral:  Negative for agitation and dysphoric mood.        Objective:     BP 128/74   Pulse 89   Temp 98.3 F (36.8 C)   Resp 16   Ht  (1.651 m)   Wt 206 lb (93.4 kg)   LMP 10/15/1999   SpO2 98%   BMI 34.28 kg/m  Wt Readings from Last 3 Encounters:  11/20/22 206 lb (93.4 kg)  07/19/22 205 lb 6.4 oz (93.2 kg)  03/16/22 198 lb 9.6 oz (90.1 kg)    Physical Exam Vitals reviewed.  Constitutional:      General: She is not in acute distress.    Appearance: Normal appearance.  HENT:     Head: Normocephalic and atraumatic.     Right Ear: External ear normal.     Left Ear: External ear normal.  Eyes:     General: No scleral icterus.       Right eye: No discharge.        Left eye: No discharge.     Conjunctiva/sclera: Conjunctivae normal.  Neck:     Thyroid: No thyromegaly.  Cardiovascular:     Rate and Rhythm: Normal rate and regular rhythm.  Pulmonary:     Effort: No respiratory distress.     Breath sounds: Normal breath sounds. No wheezing.  Abdominal:     General: Bowel sounds are normal.     Palpations: Abdomen is soft.     Tenderness: There is no abdominal tenderness.  Musculoskeletal:        General: No swelling or tenderness.     Cervical back: Neck supple. No tenderness.  Lymphadenopathy:     Cervical: No cervical adenopathy.  Skin:    Findings: No erythema or rash.  Neurological:     Mental Status: She is alert.  Psychiatric:        Mood and Affect: Mood normal.        Behavior: Behavior normal.      Outpatient Encounter Medications as of 11/20/2022  Medication Sig   Cholecalciferol (VITAMIN D) 2000 units CAPS Take 2,000 Units by mouth daily.   fluticasone (FLONASE) 50 MCG/ACT nasal spray Place 1 spray into both nostrils as needed for allergies or rhinitis.  (Patient taking differently: Place 2 sprays into both nostrils as needed for allergies or rhinitis.)   vitamin E 400 UNIT capsule Take 400 Units by mouth daily.   [  DISCONTINUED] amLODipine (NORVASC) 2.5 MG tablet TAKE 1 TABLET BY MOUTH EVERY DAY   [DISCONTINUED] rosuvastatin (CRESTOR) 10 MG tablet Take 1 tablet (10 mg total) by mouth daily.   [DISCONTINUED] telmisartan (MICARDIS) 80 MG tablet Take 1 tablet (80 mg total) by mouth daily.   amLODipine (NORVASC) 2.5 MG tablet Take 1 tablet (2.5 mg total) by mouth daily.   rosuvastatin (CRESTOR) 10 MG tablet Take 1 tablet (10 mg total) by mouth daily.   telmisartan (MICARDIS) 80 MG tablet Take 1 tablet (80 mg total) by mouth daily.   No facility-administered encounter medications on file as of 11/20/2022.     Lab Results  Component Value Date   WBC 6.0 07/14/2022   HGB 13.5 07/14/2022   HCT 41.4 07/14/2022   PLT 237.0 07/14/2022   GLUCOSE 83 11/16/2022   CHOL 149 11/16/2022   TRIG 38.0 11/16/2022   HDL 75.90 11/16/2022   LDLCALC 66 11/16/2022   ALT 10 11/16/2022   AST 22 11/16/2022   NA 139 11/16/2022   K 4.5 11/16/2022   CL 103 11/16/2022   CREATININE 1.08 11/16/2022   BUN 16 11/16/2022   CO2 28 11/16/2022   TSH 2.77 07/14/2022    MM 3D SCREEN BREAST BILATERAL  Result Date: 09/06/2022 CLINICAL DATA:  Screening. EXAM: DIGITAL SCREENING BILATERAL MAMMOGRAM WITH TOMOSYNTHESIS AND CAD TECHNIQUE: Bilateral screening digital craniocaudal and mediolateral oblique mammograms were obtained. Bilateral screening digital breast tomosynthesis was performed. The images were evaluated with computer-aided detection. COMPARISON:  Previous exam(s). ACR Breast Density Category b: There are scattered areas of fibroglandular density. FINDINGS: There are no findings suspicious for malignancy. IMPRESSION: No mammographic evidence of malignancy. A result letter of this screening mammogram will be mailed directly to the patient. RECOMMENDATION: Screening  mammogram in one year. (Code:SM-B-01Y) BI-RADS CATEGORY  1: Negative. Electronically Signed   By: Baird Lyons M.D.   On: 09/06/2022 09:41       Assessment & Plan:  Hypercholesterolemia Assessment & Plan: Continue crestor.  Low cholesterol diet and exercise.  Follow lipid panel and liver function tests.    Orders: -     Lipid panel; Future -     Hepatic function panel; Future -     Basic metabolic panel; Future  Anemia, unspecified type Assessment & Plan: Follow cbc.  Recent hgb wnl.    Essential hypertension, benign Assessment & Plan: Continue on amlodipine and micardis.  Blood pressure as outlined.  Outside checks averaging 120s/70s. Continue current medications.  Follow pressures.  Follow metabolic panel.    History of colonic polyps Assessment & Plan: Colonoscopy 04/12/21 - internal hemorrhoids.  Recommended f/u colonoscopy in 5 years.     Stress Assessment & Plan: Doing well.  Follow.    Other orders -     amLODIPine Besylate; Take 1 tablet (2.5 mg total) by mouth daily.  Dispense: 90 tablet; Refill: 3 -     Rosuvastatin Calcium; Take 1 tablet (10 mg total) by mouth daily.  Dispense: 90 tablet; Refill: 3 -     Telmisartan; Take 1 tablet (80 mg total) by mouth daily.  Dispense: 90 tablet; Refill: 3     Dale [place], MD

## 2022-11-26 ENCOUNTER — Encounter: Payer: Self-pay | Admitting: Internal Medicine

## 2022-11-26 NOTE — Assessment & Plan Note (Signed)
Follow cbc.  Recent hgb wnl.  

## 2022-11-26 NOTE — Assessment & Plan Note (Signed)
Colonoscopy 04/12/21 - internal hemorrhoids.  Recommended f/u colonoscopy in 5 years.   ?

## 2022-11-26 NOTE — Assessment & Plan Note (Signed)
Continue crestor.  Low cholesterol diet and exercise.  Follow lipid panel and liver function tests.  

## 2022-11-26 NOTE — Assessment & Plan Note (Signed)
Doing well.  Follow.  

## 2022-11-26 NOTE — Assessment & Plan Note (Signed)
Continue on amlodipine and micardis.  Blood pressure as outlined.  Outside checks averaging 120s/70s. Continue current medications.  Follow pressures.  Follow metabolic panel.  

## 2023-01-11 DIAGNOSIS — H1013 Acute atopic conjunctivitis, bilateral: Secondary | ICD-10-CM | POA: Diagnosis not present

## 2023-01-11 DIAGNOSIS — H2513 Age-related nuclear cataract, bilateral: Secondary | ICD-10-CM | POA: Diagnosis not present

## 2023-01-11 DIAGNOSIS — H40053 Ocular hypertension, bilateral: Secondary | ICD-10-CM | POA: Diagnosis not present

## 2023-03-07 ENCOUNTER — Encounter (INDEPENDENT_AMBULATORY_CARE_PROVIDER_SITE_OTHER): Payer: Self-pay

## 2023-03-22 ENCOUNTER — Other Ambulatory Visit (INDEPENDENT_AMBULATORY_CARE_PROVIDER_SITE_OTHER): Payer: Medicare Other

## 2023-03-22 DIAGNOSIS — E7800 Pure hypercholesterolemia, unspecified: Secondary | ICD-10-CM | POA: Diagnosis not present

## 2023-03-22 LAB — BASIC METABOLIC PANEL
BUN: 17 mg/dL (ref 6–23)
CO2: 29 mEq/L (ref 19–32)
Calcium: 9.3 mg/dL (ref 8.4–10.5)
Chloride: 103 mEq/L (ref 96–112)
Creatinine, Ser: 1.01 mg/dL (ref 0.40–1.20)
GFR: 54.89 mL/min — ABNORMAL LOW (ref >60.00)
Glucose, Bld: 95 mg/dL (ref 70–99)
Potassium: 4.1 mEq/L (ref 3.5–5.1)
Sodium: 138 mEq/L (ref 135–145)

## 2023-03-22 LAB — LIPID PANEL
Cholesterol: 148 mg/dL (ref 0–200)
HDL: 62.9 mg/dL (ref >39.00)
LDL Cholesterol: 74 mg/dL (ref 0–99)
NonHDL: 84.74
Total CHOL/HDL Ratio: 2
Triglycerides: 53 mg/dL (ref 0.0–149.0)
VLDL: 10.6 mg/dL (ref 0.0–40.0)

## 2023-03-22 LAB — HEPATIC FUNCTION PANEL
ALT: 13 U/L (ref 0–35)
AST: 21 U/L (ref 0–37)
Albumin: 3.9 g/dL (ref 3.5–5.2)
Alkaline Phosphatase: 50 U/L (ref 39–117)
Bilirubin, Direct: 0.1 mg/dL (ref 0.0–0.3)
Total Bilirubin: 0.7 mg/dL (ref 0.2–1.2)
Total Protein: 7.2 g/dL (ref 6.0–8.3)

## 2023-03-27 ENCOUNTER — Ambulatory Visit: Payer: Medicare Other | Admitting: Internal Medicine

## 2023-04-03 ENCOUNTER — Ambulatory Visit (INDEPENDENT_AMBULATORY_CARE_PROVIDER_SITE_OTHER): Payer: Medicare Other | Admitting: Internal Medicine

## 2023-04-03 ENCOUNTER — Encounter: Payer: Self-pay | Admitting: Internal Medicine

## 2023-04-03 VITALS — BP 122/72 | HR 67 | Temp 98.4°F | Ht 66.0 in | Wt 207.2 lb

## 2023-04-03 DIAGNOSIS — Z86010 Personal history of colonic polyps: Secondary | ICD-10-CM | POA: Diagnosis not present

## 2023-04-03 DIAGNOSIS — D649 Anemia, unspecified: Secondary | ICD-10-CM | POA: Diagnosis not present

## 2023-04-03 DIAGNOSIS — Z23 Encounter for immunization: Secondary | ICD-10-CM | POA: Diagnosis not present

## 2023-04-03 DIAGNOSIS — E559 Vitamin D deficiency, unspecified: Secondary | ICD-10-CM | POA: Diagnosis not present

## 2023-04-03 DIAGNOSIS — Z0000 Encounter for general adult medical examination without abnormal findings: Secondary | ICD-10-CM

## 2023-04-03 DIAGNOSIS — I10 Essential (primary) hypertension: Secondary | ICD-10-CM | POA: Diagnosis not present

## 2023-04-03 DIAGNOSIS — E7800 Pure hypercholesterolemia, unspecified: Secondary | ICD-10-CM

## 2023-04-03 NOTE — Assessment & Plan Note (Signed)
Continue on amlodipine and micardis.  Blood pressure as outlined.  Outside checks averaging 120s/70s. Continue current medications.  Follow pressures.  Follow metabolic panel.  

## 2023-04-03 NOTE — Assessment & Plan Note (Signed)
Physical today 04/03/23.  Colonoscopy 04/2021 - recommended f/u in 5 years.  Mammogram 09/04/22 - Birads I

## 2023-04-03 NOTE — Assessment & Plan Note (Signed)
Follow vitamin D level.  

## 2023-04-03 NOTE — Progress Notes (Signed)
Subjective:    Patient ID: Janice Floyd, female    DOB: 08-19-1950, 72 y.o.   MRN: [id number]  Patient here for  Chief Complaint  Patient presents with   Annual Exam    HPI With past history of hypercholesterolemia and hypertension.  She comes in today to follow up on these issues as well as for a complete physical exam.  Exercises.  Yoga. No chest pain or sob reported.  No abdominal pain or bowel change reported.  Trying to stay active.  Knees better.    Past Medical History:  Diagnosis Date   Anemia    iron deficient   Depression    Hyperlipemia    Hypertension    Vitamin D deficiency    Past Surgical History:  Procedure Laterality Date   APPENDECTOMY     COLONOSCOPY WITH PROPOFOL N/A 07/15/2015   Procedure: COLONOSCOPY WITH PROPOFOL;  Surgeon: Wallace Cullens, MD;  Location: [facility] ENDOSCOPY;  Service: Gastroenterology;  Laterality: N/A;   CYSTOSCOPY WITH BIOPSY N/A 04/16/2017   Procedure: CYSTOSCOPY WITH BLADDER NECK BIOPSY AND URETHRAL BIOPSY;  Surgeon: Vanna Scotland, MD;  Location: [facility] ORS;  Service: Urology;  Laterality: N/A;   MYOMECTOMY     x2   UTERINE FIBROID SURGERY  1978   Family History  Problem Relation Age of Onset   Lung cancer Father    Heart disease Mother        congestive heart failure   Hypertension Mother    Lung cancer Brother    Diabetes Brother    Colon polyps Sister    Breast cancer Neg Hx    Prostate cancer Neg Hx    Kidney cancer Neg Hx    Social History   Socioeconomic History   Marital status: Married    Spouse name: Not on file   Number of children: Not on file   Years of education: Not on file   Highest education level: Some college, no degree  Occupational History   Not on file  Tobacco Use   Smoking status: Former   Smokeless tobacco: Never   Tobacco comments:    quit smoking 1990  Vaping Use   Vaping status: Never Used  Substance and Sexual Activity   Alcohol use: Yes    Alcohol/week: 0.0 standard drinks of alcohol     Comment: glass of wine per day   Drug use: No   Sexual activity: Not Currently  Other Topics Concern   Not on file  Social History Narrative   Not on file   Social Determinants of Health   Financial Resource Strain: Low Risk  (03/30/2023)   Overall Financial Resource Strain (CARDIA)    Difficulty of Paying Living Expenses: Not hard at all  Food Insecurity: No Food Insecurity (03/30/2023)   Hunger Vital Sign    Worried About Running Out of Food in the Last Year: Never true    Ran Out of Food in the Last Year: Never true  Transportation Needs: No Transportation Needs (03/30/2023)   PRAPARE - Administrator, Civil Service (Medical): No    Lack of Transportation (Non-Medical): No  Physical Activity: Sufficiently Active (03/30/2023)   Exercise Vital Sign    Days of Exercise per Week: 3 days    Minutes of Exercise per Session: 60 min  Stress: No Stress Concern Present (03/30/2023)   [employer] of Occupational Health - Occupational Stress Questionnaire    Feeling of Stress : Not at all  Social  Connections: Unknown (03/30/2023)   Social Connection and Isolation Panel [NHANES]    Frequency of Communication with Friends and Family: Three times a week    Frequency of Social Gatherings with Friends and Family: Once a week    Attends Religious Services: Patient declined    Database administrator or Organizations: Patient declined    Attends Engineer, structural: Not on file    Marital Status: Married     Review of Systems  Constitutional:  Negative for appetite change and unexpected weight change.  HENT:  Negative for congestion, sinus pressure and sore throat.   Eyes:  Negative for pain and visual disturbance.  Respiratory:  Negative for cough, chest tightness and shortness of breath.   Cardiovascular:  Negative for chest pain, palpitations and leg swelling.  Gastrointestinal:  Negative for abdominal pain, diarrhea, nausea and vomiting.  Genitourinary:  Negative  for difficulty urinating and dysuria.  Musculoskeletal:  Negative for joint swelling and myalgias.  Skin:  Negative for color change and rash.  Neurological:  Negative for dizziness and headaches.  Hematological:  Negative for adenopathy. Does not bruise/bleed easily.  Psychiatric/Behavioral:  Negative for agitation and dysphoric mood.        Objective:     BP 122/72   Pulse 67   Temp 98.4 F (36.9 C)   Ht 5\' 6"  (1.676 m)   Wt 207 lb 3.2 oz (94 kg)   LMP 10/15/1999   SpO2 98%   BMI 33.44 kg/m  Wt Readings from Last 3 Encounters:  04/03/23 207 lb 3.2 oz (94 kg)  11/20/22 206 lb (93.4 kg)  07/19/22 205 lb 6.4 oz (93.2 kg)    Physical Exam Vitals reviewed.  Constitutional:      General: She is not in acute distress.    Appearance: Normal appearance. She is well-developed.  HENT:     Head: Normocephalic and atraumatic.     Right Ear: External ear normal.     Left Ear: External ear normal.  Eyes:     General: No scleral icterus.       Right eye: No discharge.        Left eye: No discharge.     Conjunctiva/sclera: Conjunctivae normal.  Neck:     Thyroid: No thyromegaly.  Cardiovascular:     Rate and Rhythm: Normal rate and regular rhythm.  Pulmonary:     Effort: No tachypnea, accessory muscle usage or respiratory distress.     Breath sounds: Normal breath sounds. No decreased breath sounds or wheezing.  Chest:  Breasts:    Right: No inverted nipple, mass, nipple discharge or tenderness (no axillary adenopathy).     Left: No inverted nipple, mass, nipple discharge or tenderness (no axilarry adenopathy).  Abdominal:     General: Bowel sounds are normal.     Palpations: Abdomen is soft.     Tenderness: There is no abdominal tenderness.  Musculoskeletal:        General: No swelling or tenderness.     Cervical back: Neck supple.  Lymphadenopathy:     Cervical: No cervical adenopathy.  Skin:    Findings: No erythema or rash.  Neurological:     Mental Status: She  is alert and oriented to person, place, and time.  Psychiatric:        Mood and Affect: Mood normal.        Behavior: Behavior normal.      Outpatient Encounter Medications as of 04/03/2023  Medication Sig  amLODipine (NORVASC) 2.5 MG tablet Take 1 tablet (2.5 mg total) by mouth daily.   Cholecalciferol (VITAMIN D) 2000 units CAPS Take 2,000 Units by mouth daily.   fluticasone (FLONASE) 50 MCG/ACT nasal spray Place 1 spray into both nostrils as needed for allergies or rhinitis. (Patient taking differently: Place 2 sprays into both nostrils as needed for allergies or rhinitis.)   rosuvastatin (CRESTOR) 10 MG tablet Take 1 tablet (10 mg total) by mouth daily.   telmisartan (MICARDIS) 80 MG tablet Take 1 tablet (80 mg total) by mouth daily.   vitamin E 400 UNIT capsule Take 400 Units by mouth daily.   No facility-administered encounter medications on file as of 04/03/2023.     Lab Results  Component Value Date   WBC 6.0 07/14/2022   HGB 13.5 07/14/2022   HCT 41.4 07/14/2022   PLT 237.0 07/14/2022   GLUCOSE 95 03/22/2023   CHOL 148 03/22/2023   TRIG 53.0 03/22/2023   HDL 62.90 03/22/2023   LDLCALC 74 03/22/2023   ALT 13 03/22/2023   AST 21 03/22/2023   NA 138 03/22/2023   K 4.1 03/22/2023   CL 103 03/22/2023   CREATININE 1.01 03/22/2023   BUN 17 03/22/2023   CO2 29 03/22/2023   TSH 2.77 07/14/2022    MM 3D SCREEN BREAST BILATERAL  Result Date: 09/06/2022 CLINICAL DATA:  Screening. EXAM: DIGITAL SCREENING BILATERAL MAMMOGRAM WITH TOMOSYNTHESIS AND CAD TECHNIQUE: Bilateral screening digital craniocaudal and mediolateral oblique mammograms were obtained. Bilateral screening digital breast tomosynthesis was performed. The images were evaluated with computer-aided detection. COMPARISON:  Previous exam(s). ACR Breast Density Category b: There are scattered areas of fibroglandular density. FINDINGS: There are no findings suspicious for malignancy. IMPRESSION: No mammographic evidence  of malignancy. A result letter of this screening mammogram will be mailed directly to the patient. RECOMMENDATION: Screening mammogram in one year. (Code:SM-B-01Y) BI-RADS CATEGORY  1: Negative. Electronically Signed   By: Baird Lyons M.D.   On: 09/06/2022 09:41       Assessment & Plan:  Health care maintenance Assessment & Plan: Physical today 04/03/23.  Colonoscopy 04/2021 - recommended f/u in 5 years.  Mammogram 09/04/22 - [facility]ads I   Hypercholesterolemia Assessment & Plan: Continue crestor.  Low cholesterol diet and exercise.  Follow lipid panel and liver function tests.    Orders: -     Basic metabolic panel; Future -     Hepatic function panel; Future -     Lipid panel; Future  Anemia, unspecified type Assessment & Plan: Follow cbc.  Recent hgb wnl.   Orders: -     CBC with Differential/Platelet; Future -     TSH; Future  Essential hypertension, benign Assessment & Plan: Continue on amlodipine and micardis.  Blood pressure as outlined.  Outside checks averaging 120s/70s. Continue current medications.  Follow pressures.  Follow metabolic panel.    History of colonic polyps Assessment & Plan: Colonoscopy 04/12/21 - internal hemorrhoids.  Recommended f/u colonoscopy in 5 years.     Vitamin D deficiency Assessment & Plan: Follow vitamin D level.   Orders: -     VITAMIN D 25 Hydroxy (Vit-D Deficiency, Fractures); Future     Dale Gilbert, MD

## 2023-04-03 NOTE — Assessment & Plan Note (Signed)
Follow cbc.  Recent hgb wnl.

## 2023-04-03 NOTE — Assessment & Plan Note (Signed)
Colonoscopy 04/12/21 - internal hemorrhoids.  Recommended f/u colonoscopy in 5 years.   

## 2023-04-03 NOTE — Assessment & Plan Note (Signed)
Continue crestor.  Low cholesterol diet and exercise.  Follow lipid panel and liver function tests.  

## 2023-08-07 ENCOUNTER — Other Ambulatory Visit (INDEPENDENT_AMBULATORY_CARE_PROVIDER_SITE_OTHER): Payer: Medicare Other

## 2023-08-07 DIAGNOSIS — E7800 Pure hypercholesterolemia, unspecified: Secondary | ICD-10-CM | POA: Diagnosis not present

## 2023-08-07 DIAGNOSIS — D649 Anemia, unspecified: Secondary | ICD-10-CM | POA: Diagnosis not present

## 2023-08-07 DIAGNOSIS — E559 Vitamin D deficiency, unspecified: Secondary | ICD-10-CM | POA: Diagnosis not present

## 2023-08-07 LAB — LIPID PANEL
Cholesterol: 150 mg/dL (ref 0–200)
HDL: 68.4 mg/dL (ref >39.00)
LDL Cholesterol: 74 mg/dL (ref 0–99)
NonHDL: 82.06
Total CHOL/HDL Ratio: 2
Triglycerides: 39 mg/dL (ref 0.0–149.0)
VLDL: 7.8 mg/dL (ref 0.0–40.0)

## 2023-08-07 LAB — CBC WITH DIFFERENTIAL/PLATELET
Basophils Absolute: 0 10*3/uL (ref 0.0–0.1)
Basophils Relative: 0.5 % (ref 0.0–3.0)
Eosinophils Absolute: 0.5 10*3/uL (ref 0.0–0.7)
Eosinophils Relative: 7.8 % — ABNORMAL HIGH (ref 0.0–5.0)
HCT: 39.7 % (ref 36.0–46.0)
Hemoglobin: 13 g/dL (ref 12.0–15.0)
Lymphocytes Relative: 28.6 % (ref 12.0–46.0)
Lymphs Abs: 1.7 10*3/uL (ref 0.7–4.0)
MCHC: 32.9 g/dL (ref 30.0–36.0)
MCV: 87.5 fL (ref 78.0–100.0)
Monocytes Absolute: 0.7 10*3/uL (ref 0.1–1.0)
Monocytes Relative: 11.4 % (ref 3.0–12.0)
Neutro Abs: 3.1 10*3/uL (ref 1.4–7.7)
Neutrophils Relative %: 51.7 % (ref 43.0–77.0)
Platelets: 267 10*3/uL (ref 150.0–400.0)
RBC: 4.53 Mil/uL (ref 3.87–5.11)
RDW: 15.3 % (ref 11.5–15.5)
WBC: 5.9 10*3/uL (ref 4.0–10.5)

## 2023-08-07 LAB — VITAMIN D 25 HYDROXY (VIT D DEFICIENCY, FRACTURES): VITD: 77.74 ng/mL (ref 30.00–100.00)

## 2023-08-07 LAB — BASIC METABOLIC PANEL
BUN: 15 mg/dL (ref 6–23)
CO2: 29 meq/L (ref 19–32)
Calcium: 9.4 mg/dL (ref 8.4–10.5)
Chloride: 104 meq/L (ref 96–112)
Creatinine, Ser: 1.02 mg/dL (ref 0.40–1.20)
GFR: 54.1 mL/min — ABNORMAL LOW (ref >60.00)
Glucose, Bld: 106 mg/dL — ABNORMAL HIGH (ref 70–99)
Potassium: 4.3 meq/L (ref 3.5–5.1)
Sodium: 141 meq/L (ref 135–145)

## 2023-08-07 LAB — HEPATIC FUNCTION PANEL
ALT: 10 U/L (ref 0–35)
AST: 22 U/L (ref 0–37)
Albumin: 4.1 g/dL (ref 3.5–5.2)
Alkaline Phosphatase: 55 U/L (ref 39–117)
Bilirubin, Direct: 0.1 mg/dL (ref 0.0–0.3)
Total Bilirubin: 0.7 mg/dL (ref 0.2–1.2)
Total Protein: 7.3 g/dL (ref 6.0–8.3)

## 2023-08-07 LAB — TSH: TSH: 2.44 u[IU]/mL (ref 0.35–5.50)

## 2023-08-08 NOTE — Progress Notes (Signed)
 Subjective:    Patient ID: Janice Floyd, female    DOB: 1951-01-29, 73 y.o.   MRN: [id number]  Patient here for  Chief Complaint  Patient presents with   Medical Management of Chronic Issues    HPI Here for a scheduled follow up - follow up regarding hypercholesterolemia and hypertension. Exercises.  Yoga. Continues on amlodipine  and micardis . Feels good. No chest pain or sob reported. No abdominal pain or bowel change reported.    Past Medical History:  Diagnosis Date   Anemia    iron deficient   Depression    Hyperlipemia    Hypertension    Vitamin D  deficiency    Past Surgical History:  Procedure Laterality Date   APPENDECTOMY     COLONOSCOPY WITH PROPOFOL  N/A 07/15/2015   Procedure: COLONOSCOPY WITH PROPOFOL ;  Surgeon: Deward CINDERELLA Piedmont, MD;  Location: [facility];  Service: Gastroenterology;  Laterality: N/A;   CYSTOSCOPY WITH BIOPSY N/A 04/16/2017   Procedure: CYSTOSCOPY WITH BLADDER NECK BIOPSY AND URETHRAL BIOPSY;  Surgeon: Penne Knee, MD;  Location: [facility];  Service: Urology;  Laterality: N/A;   MYOMECTOMY     x2   UTERINE FIBROID SURGERY  1978   Family History  Problem Relation Age of Onset   Lung cancer Father    Heart disease Mother        congestive heart failure   Hypertension Mother    Lung cancer Brother    Diabetes Brother    Colon polyps Sister    Breast cancer Neg Hx    Prostate cancer Neg Hx    Kidney cancer Neg Hx    Social History   Socioeconomic History   Marital status: Married    Spouse name: Not on file   Number of children: Not on file   Years of education: Not on file   Highest education level: Some college, no degree  Occupational History   Not on file  Tobacco Use   Smoking status: Former   Smokeless tobacco: Never   Tobacco comments:    quit smoking 1990  Vaping Use   Vaping status: Never Used  Substance and Sexual Activity   Alcohol use: Yes    Alcohol/week: 0.0 standard drinks of alcohol    Comment: glass of wine  per day   Drug use: No   Sexual activity: Not Currently  Other Topics Concern   Not on file  Social History Narrative   Not on file   Social Drivers of Health   Financial Resource Strain: Low Risk  (08/06/2023)   Overall Financial Resource Strain (CARDIA)    Difficulty of Paying Living Expenses: Not hard at all  Food Insecurity: No Food Insecurity (08/06/2023)   Hunger Vital Sign    Worried About Running Out of Food in the Last Year: Never true    Ran Out of Food in the Last Year: Never true  Transportation Needs: No Transportation Needs (08/06/2023)   PRAPARE - Administrator, Civil Service (Medical): No    Lack of Transportation (Non-Medical): No  Physical Activity: Sufficiently Active (08/06/2023)   Exercise Vital Sign    Days of Exercise per Week: 3 days    Minutes of Exercise per Session: 60 min  Stress: No Stress Concern Present (08/06/2023)   [employer] of Occupational Health - Occupational Stress Questionnaire    Feeling of Stress : Not at all  Social Connections: Unknown (08/06/2023)   Social Connection and Isolation Panel [NHANES]  Frequency of Communication with Friends and Family: Once a week    Frequency of Social Gatherings with Friends and Family: Once a week    Attends Religious Services: Patient declined    Database Administrator or Organizations: Yes    Attends Banker Meetings: 1 to 4 times per year    Marital Status: Married     Review of Systems  Constitutional:  Negative for appetite change and unexpected weight change.  HENT:  Negative for congestion and sinus pressure.   Respiratory:  Negative for cough, chest tightness and shortness of breath.   Cardiovascular:  Negative for chest pain and palpitations.  Gastrointestinal:  Negative for abdominal pain, diarrhea, nausea and vomiting.  Genitourinary:  Negative for difficulty urinating and dysuria.  Musculoskeletal:  Negative for joint swelling and myalgias.  Skin:   Negative for color change and rash.  Neurological:  Negative for dizziness and headaches.  Psychiatric/Behavioral:  Negative for agitation and dysphoric mood.        Objective:     BP 128/72   Pulse 83   Temp 97.9 F (36.6 C)   Resp 16   Ht 5' 6 (1.676 m)   Wt 205 lb 3.2 oz (93.1 kg)   LMP 10/15/1999   SpO2 98%   BMI 33.12 kg/m  Wt Readings from Last 3 Encounters:  08/09/23 205 lb 3.2 oz (93.1 kg)  04/03/23 207 lb 3.2 oz (94 kg)  11/20/22 206 lb (93.4 kg)    Physical Exam Vitals reviewed.  Constitutional:      General: She is not in acute distress.    Appearance: Normal appearance.  HENT:     Head: Normocephalic and atraumatic.     Right Ear: External ear normal.     Left Ear: External ear normal.     Mouth/Throat:     Pharynx: No oropharyngeal exudate or posterior oropharyngeal erythema.  Eyes:     General: No scleral icterus.       Right eye: No discharge.        Left eye: No discharge.     Conjunctiva/sclera: Conjunctivae normal.  Neck:     Thyroid : No thyromegaly.  Cardiovascular:     Rate and Rhythm: Normal rate and regular rhythm.  Pulmonary:     Effort: No respiratory distress.     Breath sounds: Normal breath sounds. No wheezing.  Abdominal:     General: Bowel sounds are normal.     Palpations: Abdomen is soft.     Tenderness: There is no abdominal tenderness.  Musculoskeletal:        General: No swelling or tenderness.     Cervical back: Neck supple. No tenderness.  Lymphadenopathy:     Cervical: No cervical adenopathy.  Skin:    Findings: No erythema or rash.  Neurological:     Mental Status: She is alert.  Psychiatric:        Mood and Affect: Mood normal.        Behavior: Behavior normal.      Outpatient Encounter Medications as of 08/09/2023  Medication Sig   Cholecalciferol (VITAMIN D ) 2000 units CAPS Take 2,000 Units by mouth daily.   fluticasone  (FLONASE ) 50 MCG/ACT nasal spray Place 1 spray into both nostrils as needed for  allergies or rhinitis. (Patient taking differently: Place 2 sprays into both nostrils as needed for allergies or rhinitis.)   vitamin E 400 UNIT capsule Take 400 Units by mouth daily.   [DISCONTINUED] amLODipine  (NORVASC ) 2.5  MG tablet Take 1 tablet (2.5 mg total) by mouth daily.   [DISCONTINUED] rosuvastatin  (CRESTOR ) 10 MG tablet Take 1 tablet (10 mg total) by mouth daily.   [DISCONTINUED] telmisartan  (MICARDIS ) 80 MG tablet Take 1 tablet (80 mg total) by mouth daily.   amLODipine  (NORVASC ) 2.5 MG tablet Take 1 tablet (2.5 mg total) by mouth daily.   rosuvastatin  (CRESTOR ) 10 MG tablet Take 1 tablet (10 mg total) by mouth daily.   telmisartan  (MICARDIS ) 80 MG tablet Take 1 tablet (80 mg total) by mouth daily.   No facility-administered encounter medications on file as of 08/09/2023.     Lab Results  Component Value Date   WBC 5.9 08/07/2023   HGB 13.0 08/07/2023   HCT 39.7 08/07/2023   PLT 267.0 08/07/2023   GLUCOSE 106 (H) 08/07/2023   CHOL 150 08/07/2023   TRIG 39.0 08/07/2023   HDL 68.40 08/07/2023   LDLCALC 74 08/07/2023   ALT 10 08/07/2023   AST 22 08/07/2023   NA 141 08/07/2023   K 4.3 08/07/2023   CL 104 08/07/2023   CREATININE 1.02 08/07/2023   BUN 15 08/07/2023   CO2 29 08/07/2023   TSH 2.44 08/07/2023    MM 3D SCREEN BREAST BILATERAL Result Date: 09/06/2022 CLINICAL DATA:  Screening. EXAM: DIGITAL SCREENING BILATERAL MAMMOGRAM WITH TOMOSYNTHESIS AND CAD TECHNIQUE: Bilateral screening digital craniocaudal and mediolateral oblique mammograms were obtained. Bilateral screening digital breast tomosynthesis was performed. The images were evaluated with computer-aided detection. COMPARISON:  Previous exam(s). ACR Breast Density Category b: There are scattered areas of fibroglandular density. FINDINGS: There are no findings suspicious for malignancy. IMPRESSION: No mammographic evidence of malignancy. A result letter of this screening mammogram will be mailed directly to the  patient. RECOMMENDATION: Screening mammogram in one year. (Code:SM-B-01Y) BI-RADS CATEGORY  1: Negative. Electronically Signed   By: Dina  Arceo M.D.   On: 09/06/2022 09:41       Assessment & Plan:  Hypercholesterolemia Assessment & Plan: Continue crestor .  Low cholesterol diet and exercise.  Follow lipid panel and liver function tests.    Orders: -     Lipid panel; Future -     Hepatic function panel; Future -     Basic metabolic panel; Future  Visit for screening mammogram -     3D Screening Mammogram, Left and Right; Future  History of colonic polyps Assessment & Plan: Colonoscopy 04/12/21 - internal hemorrhoids.  Recommended f/u colonoscopy in 5 years.     Essential hypertension, benign Assessment & Plan: Continue on amlodipine  and micardis .  Blood pressure as outlined. Continue current medications.  Follow pressures.  Follow metabolic panel.    Other orders -     amLODIPine  Besylate; Take 1 tablet (2.5 mg total) by mouth daily.  Dispense: 90 tablet; Refill: 3 -     Rosuvastatin  Calcium ; Take 1 tablet (10 mg total) by mouth daily.  Dispense: 90 tablet; Refill: 3 -     Telmisartan ; Take 1 tablet (80 mg total) by mouth daily.  Dispense: 90 tablet; Refill: 3     Allena Hamilton, MD

## 2023-08-09 ENCOUNTER — Encounter: Payer: Self-pay | Admitting: Internal Medicine

## 2023-08-09 ENCOUNTER — Ambulatory Visit (INDEPENDENT_AMBULATORY_CARE_PROVIDER_SITE_OTHER): Payer: Medicare Other | Admitting: Internal Medicine

## 2023-08-09 VITALS — BP 128/72 | HR 83 | Temp 97.9°F | Resp 16 | Ht 66.0 in | Wt 205.2 lb

## 2023-08-09 DIAGNOSIS — I10 Essential (primary) hypertension: Secondary | ICD-10-CM | POA: Diagnosis not present

## 2023-08-09 DIAGNOSIS — E7800 Pure hypercholesterolemia, unspecified: Secondary | ICD-10-CM

## 2023-08-09 DIAGNOSIS — Z860100 Personal history of colon polyps, unspecified: Secondary | ICD-10-CM

## 2023-08-09 DIAGNOSIS — Z1231 Encounter for screening mammogram for malignant neoplasm of breast: Secondary | ICD-10-CM | POA: Diagnosis not present

## 2023-08-09 MED ORDER — AMLODIPINE BESYLATE 2.5 MG PO TABS: 2.5000 mg | ORAL_TABLET | Freq: Every day | ORAL | 3 refills | Status: AC

## 2023-08-09 MED ORDER — TELMISARTAN 80 MG PO TABS: 80.0000 mg | ORAL_TABLET | Freq: Every day | ORAL | 3 refills | Status: DC

## 2023-08-09 MED ORDER — ROSUVASTATIN CALCIUM 10 MG PO TABS: 10.0000 mg | ORAL_TABLET | Freq: Every day | ORAL | 3 refills | Status: DC

## 2023-08-12 ENCOUNTER — Encounter: Payer: Self-pay | Admitting: Internal Medicine

## 2023-08-12 NOTE — Assessment & Plan Note (Signed)
 Continue crestor.  Low cholesterol diet and exercise.  Follow lipid panel and liver function tests.

## 2023-08-12 NOTE — Assessment & Plan Note (Signed)
Continue on amlodipine and micardis.  Blood pressure as outlined.  Continue current medications.  Follow pressures.  Follow metabolic panel.

## 2023-08-12 NOTE — Assessment & Plan Note (Signed)
Colonoscopy 04/12/21 - internal hemorrhoids.  Recommended f/u colonoscopy in 5 years.   

## 2023-09-06 ENCOUNTER — Ambulatory Visit
Admission: RE | Admit: 2023-09-06 | Discharge: 2023-09-06 | Disposition: A | Discharge status: Home / Self Care | Payer: Medicare Other | Source: Ambulatory Visit | Attending: Internal Medicine | Admitting: Internal Medicine

## 2023-09-06 DIAGNOSIS — Z1231 Encounter for screening mammogram for malignant neoplasm of breast: Secondary | ICD-10-CM | POA: Diagnosis not present

## 2023-12-05 ENCOUNTER — Other Ambulatory Visit (INDEPENDENT_AMBULATORY_CARE_PROVIDER_SITE_OTHER): Payer: Medicare Other

## 2023-12-05 DIAGNOSIS — E7800 Pure hypercholesterolemia, unspecified: Secondary | ICD-10-CM | POA: Diagnosis not present

## 2023-12-05 LAB — HEPATIC FUNCTION PANEL
ALT: 10 U/L (ref 0–35)
AST: 20 U/L (ref 0–37)
Albumin: 4.1 g/dL (ref 3.5–5.2)
Alkaline Phosphatase: 57 U/L (ref 39–117)
Bilirubin, Direct: 0.2 mg/dL (ref 0.0–0.3)
Total Bilirubin: 0.9 mg/dL (ref 0.2–1.2)
Total Protein: 7.4 g/dL (ref 6.0–8.3)

## 2023-12-05 LAB — LIPID PANEL
Cholesterol: 150 mg/dL (ref 0–200)
HDL: 73.9 mg/dL (ref >39.00)
LDL Cholesterol: 68 mg/dL (ref 0–99)
NonHDL: 76.21
Total CHOL/HDL Ratio: 2
Triglycerides: 43 mg/dL (ref 0.0–149.0)
VLDL: 8.6 mg/dL (ref 0.0–40.0)

## 2023-12-05 LAB — BASIC METABOLIC PANEL WITH GFR
BUN: 14 mg/dL (ref 6–23)
CO2: 29 meq/L (ref 19–32)
Calcium: 9.4 mg/dL (ref 8.4–10.5)
Chloride: 102 meq/L (ref 96–112)
Creatinine, Ser: 0.99 mg/dL (ref 0.40–1.20)
GFR: 55.94 mL/min — ABNORMAL LOW (ref >60.00)
Glucose, Bld: 96 mg/dL (ref 70–99)
Potassium: 4.3 meq/L (ref 3.5–5.1)
Sodium: 139 meq/L (ref 135–145)

## 2023-12-07 ENCOUNTER — Ambulatory Visit (INDEPENDENT_AMBULATORY_CARE_PROVIDER_SITE_OTHER): Payer: Medicare Other | Admitting: Internal Medicine

## 2023-12-07 ENCOUNTER — Encounter: Payer: Self-pay | Admitting: Internal Medicine

## 2023-12-07 VITALS — BP 122/74 | HR 74 | Temp 98.0°F | Resp 16 | Ht 66.0 in | Wt 205.2 lb

## 2023-12-07 DIAGNOSIS — E559 Vitamin D deficiency, unspecified: Secondary | ICD-10-CM

## 2023-12-07 DIAGNOSIS — Z860100 Personal history of colon polyps, unspecified: Secondary | ICD-10-CM | POA: Diagnosis not present

## 2023-12-07 DIAGNOSIS — I10 Essential (primary) hypertension: Secondary | ICD-10-CM

## 2023-12-07 DIAGNOSIS — E7800 Pure hypercholesterolemia, unspecified: Secondary | ICD-10-CM | POA: Diagnosis not present

## 2023-12-07 NOTE — Assessment & Plan Note (Signed)
Continue crestor.  Low cholesterol diet and exercise.  Follow lipid panel.  

## 2023-12-07 NOTE — Assessment & Plan Note (Signed)
Colonoscopy 04/12/21 - internal hemorrhoids.  Recommended f/u colonoscopy in 5 years.   

## 2023-12-07 NOTE — Assessment & Plan Note (Signed)
 Continue on amlodipine  and micardis .  Blood pressure as outlined. Continue current medications.  Follow pressures. Discussed recent labs.  GFR 55. No changes. Follow.

## 2023-12-07 NOTE — Progress Notes (Signed)
 Subjective:    Patient ID: Janice Floyd, female    DOB: 10-Aug-1950, 73 y.o.   MRN: [id number]  Patient here for  Chief Complaint  Patient presents with   Medical Management of Chronic Issues    HPI Here for a scheduled follow up - follow up regarding hypercholesterolemia and hypertension. Continues on amlodipine  and micardis . Doing well feels good. No chest pain or sob reported. No abdominal pain or bowel change reported. Stays active. Blood pressure doing well.    Past Medical History:  Diagnosis Date   Anemia    iron deficient   Depression    Hyperlipemia    Hypertension    Vitamin D  deficiency    Past Surgical History:  Procedure Laterality Date   APPENDECTOMY     COLONOSCOPY WITH PROPOFOL  N/A 07/15/2015   Procedure: COLONOSCOPY WITH PROPOFOL ;  Surgeon: Stephens Eis, MD;  Location: [facility];  Service: Gastroenterology;  Laterality: N/A;   CYSTOSCOPY WITH BIOPSY N/A 04/16/2017   Procedure: CYSTOSCOPY WITH BLADDER NECK BIOPSY AND URETHRAL BIOPSY;  Surgeon: Dustin Gimenez, MD;  Location: [facility];  Service: Urology;  Laterality: N/A;   MYOMECTOMY     x2   UTERINE FIBROID SURGERY  1978   Family History  Problem Relation Age of Onset   Lung cancer Father    Heart disease Mother        congestive heart failure   Hypertension Mother    Lung cancer Brother    Diabetes Brother    Colon polyps Sister    Breast cancer Neg Hx    Prostate cancer Neg Hx    Kidney cancer Neg Hx    Social History   Socioeconomic History   Marital status: Married    Spouse name: Not on file   Number of children: Not on file   Years of education: Not on file   Highest education level: Some college, no degree  Occupational History   Not on file  Tobacco Use   Smoking status: Former   Smokeless tobacco: Never   Tobacco comments:    quit smoking 1990  Vaping Use   Vaping status: Never Used  Substance and Sexual Activity   Alcohol use: Yes    Alcohol/week: 0.0 standard drinks of  alcohol    Comment: glass of wine per day   Drug use: No   Sexual activity: Not Currently  Other Topics Concern   Not on file  Social History Narrative   Not on file   Social Drivers of Health   Financial Resource Strain: Low Risk  (08/06/2023)   Overall Financial Resource Strain (CARDIA)    Difficulty of Paying Living Expenses: Not hard at all  Food Insecurity: No Food Insecurity (08/06/2023)   Hunger Vital Sign    Worried About Running Out of Food in the Last Year: Never true    Ran Out of Food in the Last Year: Never true  Transportation Needs: No Transportation Needs (08/06/2023)   PRAPARE - Administrator, Civil Service (Medical): No    Lack of Transportation (Non-Medical): No  Physical Activity: Sufficiently Active (08/06/2023)   Exercise Vital Sign    Days of Exercise per Week: 3 days    Minutes of Exercise per Session: 60 min  Stress: No Stress Concern Present (08/06/2023)   [employer] of Occupational Health - Occupational Stress Questionnaire    Feeling of Stress : Not at all  Social Connections: Unknown (08/06/2023)   Social Connection and  Isolation Panel [NHANES]    Frequency of Communication with Friends and Family: Once a week    Frequency of Social Gatherings with Friends and Family: Once a week    Attends Religious Services: Patient declined    Database administrator or Organizations: Yes    Attends Banker Meetings: 1 to 4 times per year    Marital Status: Married     Review of Systems  Constitutional:  Negative for appetite change and unexpected weight change.  HENT:  Negative for congestion and sinus pressure.   Respiratory:  Negative for cough, chest tightness and shortness of breath.   Cardiovascular:  Negative for chest pain, palpitations and leg swelling.  Gastrointestinal:  Negative for abdominal pain, diarrhea, nausea and vomiting.  Genitourinary:  Negative for difficulty urinating and dysuria.  Musculoskeletal:   Negative for joint swelling and myalgias.  Skin:  Negative for color change and rash.  Neurological:  Negative for dizziness and headaches.  Psychiatric/Behavioral:  Negative for agitation and dysphoric mood.        Objective:     BP 122/74   Pulse 74   Temp 98 F (36.7 C)   Resp 16   Ht 5\' 6"  (1.676 m)   Wt 205 lb 3.2 oz (93.1 kg)   LMP 10/15/1999   SpO2 99%   BMI 33.12 kg/m  Wt Readings from Last 3 Encounters:  12/07/23 205 lb 3.2 oz (93.1 kg)  08/09/23 205 lb 3.2 oz (93.1 kg)  04/03/23 207 lb 3.2 oz (94 kg)    Physical Exam Vitals reviewed.  Constitutional:      General: She is not in acute distress.    Appearance: Normal appearance.  HENT:     Head: Normocephalic and atraumatic.     Right Ear: External ear normal.     Left Ear: External ear normal.     Mouth/Throat:     Pharynx: No oropharyngeal exudate or posterior oropharyngeal erythema.  Eyes:     General: No scleral icterus.       Right eye: No discharge.        Left eye: No discharge.     Conjunctiva/sclera: Conjunctivae normal.  Neck:     Thyroid : No thyromegaly.  Cardiovascular:     Rate and Rhythm: Normal rate and regular rhythm.  Pulmonary:     Effort: No respiratory distress.     Breath sounds: Normal breath sounds. No wheezing.  Abdominal:     General: Bowel sounds are normal.     Palpations: Abdomen is soft.     Tenderness: There is no abdominal tenderness.  Musculoskeletal:        General: No swelling or tenderness.     Cervical back: Neck supple. No tenderness.  Lymphadenopathy:     Cervical: No cervical adenopathy.  Skin:    Findings: No erythema or rash.  Neurological:     Mental Status: She is alert.  Psychiatric:        Mood and Affect: Mood normal.        Behavior: Behavior normal.         Outpatient Encounter Medications as of 12/07/2023  Medication Sig   amLODipine  (NORVASC ) 2.5 MG tablet Take 1 tablet (2.5 mg total) by mouth daily.   Cholecalciferol (VITAMIN D ) 2000  units CAPS Take 2,000 Units by mouth daily.   fluticasone  (FLONASE ) 50 MCG/ACT nasal spray Place 1 spray into both nostrils as needed for allergies or rhinitis. (Patient taking differently: Place 2  sprays into both nostrils as needed for allergies or rhinitis.)   rosuvastatin  (CRESTOR ) 10 MG tablet Take 1 tablet (10 mg total) by mouth daily.   telmisartan  (MICARDIS ) 80 MG tablet Take 1 tablet (80 mg total) by mouth daily.   vitamin E 400 UNIT capsule Take 400 Units by mouth daily.   No facility-administered encounter medications on file as of 12/07/2023.     Lab Results  Component Value Date   WBC 5.9 08/07/2023   HGB 13.0 08/07/2023   HCT 39.7 08/07/2023   PLT 267.0 08/07/2023   GLUCOSE 96 12/05/2023   CHOL 150 12/05/2023   TRIG 43.0 12/05/2023   HDL 73.90 12/05/2023   LDLCALC 68 12/05/2023   ALT 10 12/05/2023   AST 20 12/05/2023   NA 139 12/05/2023   K 4.3 12/05/2023   CL 102 12/05/2023   CREATININE 0.99 12/05/2023   BUN 14 12/05/2023   CO2 29 12/05/2023   TSH 2.44 08/07/2023    MM 3D SCREENING MAMMOGRAM BILATERAL BREAST Result Date: 09/07/2023 CLINICAL DATA:  Screening. EXAM: DIGITAL SCREENING BILATERAL MAMMOGRAM WITH TOMOSYNTHESIS AND CAD TECHNIQUE: Bilateral screening digital craniocaudal and mediolateral oblique mammograms were obtained. Bilateral screening digital breast tomosynthesis was performed. The images were evaluated with computer-aided detection. COMPARISON:  Previous exam(s). ACR Breast Density Category b: There are scattered areas of fibroglandular density. FINDINGS: There are no findings suspicious for malignancy. IMPRESSION: No mammographic evidence of malignancy. A result letter of this screening mammogram will be mailed directly to the patient. RECOMMENDATION: Screening mammogram in one year. (Code:SM-B-01Y) BI-RADS CATEGORY  1: Negative. Electronically Signed   By: Dina  Arceo M.D.   On: 09/07/2023 17:12       Assessment & Plan:  Essential hypertension,  benign Assessment & Plan: Continue on amlodipine  and micardis .  Blood pressure as outlined. Continue current medications.  Follow pressures. Discussed recent labs.  GFR 55. No changes. Follow.    Hypercholesterolemia Assessment & Plan: Continue crestor . Low cholesterol diet and exercise. Follow lipid panel.   Orders: -     Lipid panel; Future -     Hepatic function panel; Future -     Basic metabolic panel with GFR; Future  History of colonic polyps Assessment & Plan: Colonoscopy 04/12/21 - internal hemorrhoids.  Recommended f/u colonoscopy in 5 years.     Vitamin D  deficiency Assessment & Plan: Follow vitamin D  level.       Dellar Fenton, MD

## 2023-12-07 NOTE — Assessment & Plan Note (Signed)
Follow vitamin D level.  

## 2024-01-14 DIAGNOSIS — H43811 Vitreous degeneration, right eye: Secondary | ICD-10-CM | POA: Diagnosis not present

## 2024-01-14 DIAGNOSIS — H2513 Age-related nuclear cataract, bilateral: Secondary | ICD-10-CM | POA: Diagnosis not present

## 2024-01-14 DIAGNOSIS — H40053 Ocular hypertension, bilateral: Secondary | ICD-10-CM | POA: Diagnosis not present

## 2024-04-09 ENCOUNTER — Other Ambulatory Visit (INDEPENDENT_AMBULATORY_CARE_PROVIDER_SITE_OTHER)

## 2024-04-09 DIAGNOSIS — E7800 Pure hypercholesterolemia, unspecified: Secondary | ICD-10-CM

## 2024-04-09 LAB — LIPID PANEL
Cholesterol: 124 mg/dL (ref 0–200)
HDL: 48.3 mg/dL (ref >39.00)
LDL Cholesterol: 65 mg/dL (ref 0–99)
NonHDL: 75.88
Total CHOL/HDL Ratio: 3
Triglycerides: 55 mg/dL (ref 0.0–149.0)
VLDL: 11 mg/dL (ref 0.0–40.0)

## 2024-04-09 LAB — HEPATIC FUNCTION PANEL
ALT: 10 U/L (ref 0–35)
AST: 20 U/L (ref 0–37)
Albumin: 3.8 g/dL (ref 3.5–5.2)
Alkaline Phosphatase: 55 U/L (ref 39–117)
Bilirubin, Direct: 0.2 mg/dL (ref 0.0–0.3)
Total Bilirubin: 0.8 mg/dL (ref 0.2–1.2)
Total Protein: 7.7 g/dL (ref 6.0–8.3)

## 2024-04-09 LAB — BASIC METABOLIC PANEL WITH GFR
BUN: 11 mg/dL (ref 6–23)
CO2: 32 meq/L (ref 19–32)
Calcium: 9.3 mg/dL (ref 8.4–10.5)
Chloride: 101 meq/L (ref 96–112)
Creatinine, Ser: 0.95 mg/dL (ref 0.40–1.20)
GFR: 58.64 mL/min — ABNORMAL LOW (ref >60.00)
Glucose, Bld: 107 mg/dL — ABNORMAL HIGH (ref 70–99)
Potassium: 4.1 meq/L (ref 3.5–5.1)
Sodium: 141 meq/L (ref 135–145)

## 2024-04-10 ENCOUNTER — Ambulatory Visit: Payer: Self-pay | Admitting: Internal Medicine

## 2024-04-11 ENCOUNTER — Ambulatory Visit: Admitting: Internal Medicine

## 2024-04-11 VITALS — BP 128/74 | HR 80 | Resp 16 | Ht 66.0 in | Wt 194.0 lb

## 2024-04-11 DIAGNOSIS — Z0000 Encounter for general adult medical examination without abnormal findings: Secondary | ICD-10-CM | POA: Diagnosis not present

## 2024-04-11 DIAGNOSIS — E7800 Pure hypercholesterolemia, unspecified: Secondary | ICD-10-CM | POA: Diagnosis not present

## 2024-04-11 DIAGNOSIS — D649 Anemia, unspecified: Secondary | ICD-10-CM

## 2024-04-11 DIAGNOSIS — J029 Acute pharyngitis, unspecified: Secondary | ICD-10-CM

## 2024-04-11 DIAGNOSIS — I10 Essential (primary) hypertension: Secondary | ICD-10-CM | POA: Diagnosis not present

## 2024-04-11 DIAGNOSIS — E559 Vitamin D deficiency, unspecified: Secondary | ICD-10-CM | POA: Diagnosis not present

## 2024-04-11 DIAGNOSIS — Z860100 Personal history of colon polyps, unspecified: Secondary | ICD-10-CM

## 2024-04-11 DIAGNOSIS — R0981 Nasal congestion: Secondary | ICD-10-CM | POA: Diagnosis not present

## 2024-04-11 DIAGNOSIS — R319 Hematuria, unspecified: Secondary | ICD-10-CM | POA: Diagnosis not present

## 2024-04-11 LAB — POC COVID19 BINAXNOW: SARS Coronavirus 2 Ag: NEGATIVE

## 2024-04-11 NOTE — Progress Notes (Signed)
 Subjective:    Patient ID: SHARONNE RICKETTS, female    DOB: 1951-07-21, 73 y.o.   MRN: [id number]  Patient here for  Chief Complaint  Patient presents with   Annual Exam    HPI With past history of hypercholesterolemia and hypertension, she comes in today to follow up on these issues as well as for a complete physical exam. Continues on amlodipine  and micardis . She reports that starting 8 days ago - noticed increased congestion. Fever 102 x 1 day. No fever since. No sinus tenderness. No headache. Persistent nasal congestion. Previous sore throat. No sore throat now. Some cough from drainage. No sob. No chest pain. No vomiting or diarrhea. Some decreased appetite. Eating and drinking.    Past Medical History:  Diagnosis Date   Anemia    iron deficient   Depression    Hyperlipemia    Hypertension    Vitamin D  deficiency    Past Surgical History:  Procedure Laterality Date   APPENDECTOMY     COLONOSCOPY WITH PROPOFOL  N/A 07/15/2015   Procedure: COLONOSCOPY WITH PROPOFOL ;  Surgeon: Deward CINDERELLA Piedmont, MD;  Location: [facility] ENDOSCOPY;  Service: Gastroenterology;  Laterality: N/A;   CYSTOSCOPY WITH BIOPSY N/A 04/16/2017   Procedure: CYSTOSCOPY WITH BLADDER NECK BIOPSY AND URETHRAL BIOPSY;  Surgeon: Penne Knee, MD;  Location: [facility] ORS;  Service: Urology;  Laterality: N/A;   MYOMECTOMY     x2   UTERINE FIBROID SURGERY  1978   Family History  Problem Relation Age of Onset   Lung cancer Father    Heart disease Mother        congestive heart failure   Hypertension Mother    Lung cancer Brother    Diabetes Brother    Colon polyps Sister    Breast cancer Neg Hx    Prostate cancer Neg Hx    Kidney cancer Neg Hx    Social History   Socioeconomic History   Marital status: Married    Spouse name: Not on file   Number of children: Not on file   Years of education: Not on file   Highest education level: Some college, no degree  Occupational History   Not on file  Tobacco Use   Smoking  status: Former   Smokeless tobacco: Never   Tobacco comments:    quit smoking 1990  Vaping Use   Vaping status: Never Used  Substance and Sexual Activity   Alcohol use: Yes    Alcohol/week: 0.0 standard drinks of alcohol    Comment: glass of wine per day   Drug use: No   Sexual activity: Not Currently  Other Topics Concern   Not on file  Social History Narrative   Not on file   Social Drivers of Health   Financial Resource Strain: Low Risk  (04/06/2024)   Overall Financial Resource Strain (CARDIA)    Difficulty of Paying Living Expenses: Not hard at all  Food Insecurity: No Food Insecurity (04/06/2024)   Hunger Vital Sign    Worried About Running Out of Food in the Last Year: Never true    Ran Out of Food in the Last Year: Never true  Transportation Needs: No Transportation Needs (04/06/2024)   PRAPARE - Administrator, Civil Service (Medical): No    Lack of Transportation (Non-Medical): No  Physical Activity: Insufficiently Active (04/06/2024)   Exercise Vital Sign    Days of Exercise per Week: 2 days    Minutes of Exercise per Session: 20  min  Stress: No Stress Concern Present (04/06/2024)   [employer] of Occupational Health - Occupational Stress Questionnaire    Feeling of Stress: Not at all  Social Connections: Moderately Isolated (04/06/2024)   Social Connection and Isolation Panel    Frequency of Communication with Friends and Family: Once a week    Frequency of Social Gatherings with Friends and Family: Once a week    Attends Religious Services: Patient declined    Database administrator or Organizations: Yes    Attends Banker Meetings: 1 to 4 times per year    Marital Status: Married     Review of Systems  Constitutional:  Positive for fever. Negative for unexpected weight change.       Some decreased appetite.   HENT:  Positive for congestion and postnasal drip. Negative for sinus pressure and sore throat.   Eyes:  Negative for  pain and visual disturbance.  Respiratory:  Positive for cough. Negative for chest tightness and shortness of breath.   Cardiovascular:  Negative for chest pain, palpitations and leg swelling.  Gastrointestinal:  Negative for abdominal pain, diarrhea, nausea and vomiting.  Genitourinary:  Negative for difficulty urinating and dysuria.  Musculoskeletal:  Negative for joint swelling and myalgias.  Skin:  Negative for color change and rash.  Neurological:  Negative for dizziness and headaches.  Hematological:  Negative for adenopathy. Does not bruise/bleed easily.  Psychiatric/Behavioral:  Negative for agitation and dysphoric mood.        Objective:     BP 128/74   Pulse 80   Resp 16   Ht 5' 6 (1.676 m)   Wt 194 lb (88 kg)   LMP 10/15/1999   SpO2 98%   BMI 31.31 kg/m  Wt Readings from Last 3 Encounters:  04/11/24 194 lb (88 kg)  12/07/23 205 lb 3.2 oz (93.1 kg)  08/09/23 205 lb 3.2 oz (93.1 kg)    Physical Exam Vitals reviewed.  Constitutional:      General: She is not in acute distress.    Appearance: Normal appearance. She is well-developed.  HENT:     Head: Normocephalic and atraumatic.     Right Ear: External ear normal.     Left Ear: External ear normal.     Mouth/Throat:     Pharynx: No oropharyngeal exudate or posterior oropharyngeal erythema.  Eyes:     General: No scleral icterus.       Right eye: No discharge.        Left eye: No discharge.     Conjunctiva/sclera: Conjunctivae normal.  Neck:     Thyroid : No thyromegaly.  Cardiovascular:     Rate and Rhythm: Normal rate and regular rhythm.  Pulmonary:     Effort: No tachypnea, accessory muscle usage or respiratory distress.     Breath sounds: Normal breath sounds. No decreased breath sounds or wheezing.  Chest:  Breasts:    Right: No inverted nipple, mass, nipple discharge or tenderness (no axillary adenopathy).     Left: No inverted nipple, mass, nipple discharge or tenderness (no axilarry adenopathy).   Abdominal:     General: Bowel sounds are normal.     Palpations: Abdomen is soft.     Tenderness: There is no abdominal tenderness.  Musculoskeletal:        General: No swelling or tenderness.     Cervical back: Neck supple.  Lymphadenopathy:     Cervical: No cervical adenopathy.  Skin:    Findings: No  erythema or rash.  Neurological:     Mental Status: She is alert and oriented to person, place, and time.  Psychiatric:        Mood and Affect: Mood normal.        Behavior: Behavior normal.         Outpatient Encounter Medications as of 04/11/2024  Medication Sig   amLODipine  (NORVASC ) 2.5 MG tablet Take 1 tablet (2.5 mg total) by mouth daily.   Cholecalciferol (VITAMIN D ) 2000 units CAPS Take 2,000 Units by mouth daily.   fluticasone  (FLONASE ) 50 MCG/ACT nasal spray Place 1 spray into both nostrils as needed for allergies or rhinitis. (Patient taking differently: Place 2 sprays into both nostrils as needed for allergies or rhinitis.)   rosuvastatin  (CRESTOR ) 10 MG tablet Take 1 tablet (10 mg total) by mouth daily.   telmisartan  (MICARDIS ) 80 MG tablet Take 1 tablet (80 mg total) by mouth daily.   vitamin E 400 UNIT capsule Take 400 Units by mouth daily.   No facility-administered encounter medications on file as of 04/11/2024.     Lab Results  Component Value Date   WBC 5.9 08/07/2023   HGB 13.0 08/07/2023   HCT 39.7 08/07/2023   PLT 267.0 08/07/2023   GLUCOSE 107 (H) 04/09/2024   CHOL 124 04/09/2024   TRIG 55.0 04/09/2024   HDL 48.30 04/09/2024   LDLCALC 65 04/09/2024   ALT 10 04/09/2024   AST 20 04/09/2024   NA 141 04/09/2024   K 4.1 04/09/2024   CL 101 04/09/2024   CREATININE 0.95 04/09/2024   BUN 11 04/09/2024   CO2 32 04/09/2024   TSH 2.44 08/07/2023    MM 3D SCREENING MAMMOGRAM BILATERAL BREAST Result Date: 09/07/2023 CLINICAL DATA:  Screening. EXAM: DIGITAL SCREENING BILATERAL MAMMOGRAM WITH TOMOSYNTHESIS AND CAD TECHNIQUE: Bilateral screening digital  craniocaudal and mediolateral oblique mammograms were obtained. Bilateral screening digital breast tomosynthesis was performed. The images were evaluated with computer-aided detection. COMPARISON:  Previous exam(s). ACR Breast Density Category b: There are scattered areas of fibroglandular density. FINDINGS: There are no findings suspicious for malignancy. IMPRESSION: No mammographic evidence of malignancy. A result letter of this screening mammogram will be mailed directly to the patient. RECOMMENDATION: Screening mammogram in one year. (Code:SM-B-01Y) BI-RADS CATEGORY  1: Negative. Electronically Signed   By: Dina  Arceo M.D.   On: 09/07/2023 17:12       Assessment & Plan:  Health care maintenance Assessment & Plan: Physical today 04/11/24.  Colonoscopy 04/2021 - recommended f/u in 5 years.  Mammogram 09/06/23 - Birads I   Hypercholesterolemia Assessment & Plan: Continue crestor . Low cholesterol diet and exercise. Follow lipid panel. No changes in medication today.  Lab Results  Component Value Date   CHOL 124 04/09/2024   HDL 48.30 04/09/2024   LDLCALC 65 04/09/2024   TRIG 55.0 04/09/2024   CHOLHDL 3 04/09/2024     Orders: -     Lipid panel; Future -     Hepatic function panel; Future -     Basic metabolic panel with GFR; Future  Anemia, unspecified type Assessment & Plan: Follow cbc.   Orders: -     CBC with Differential/Platelet; Future -     TSH; Future  Sore throat -     POC COVID-19 BinaxNow  Essential hypertension, benign Assessment & Plan: Continue on amlodipine  and micardis .  Blood pressure as outlined. Continue current medications.  Follow pressures. No changes in medication today.    Hematuria, unspecified type Assessment &  Plan: Worked up by urology.  S/p cystoscopy and biopsy.  Per report, checked out ok.  Recommended f/u as needed.     History of colonic polyps Assessment & Plan: Colonoscopy 04/12/21 - internal hemorrhoids.  Recommended f/u colonoscopy in 5  years.     Vitamin D  deficiency Assessment & Plan: Follow vitamin D  level.   Orders: -     VITAMIN D  25 Hydroxy (Vit-D Deficiency, Fractures); Future  Nasal congestion Assessment & Plan: Increased nasal congestion and drainage. Covid test negative. Treat with steroid nasal spray and saline nasal spray as directed. Robitussin DM as directed. Follow. Call with update.       Allena Hamilton, MD

## 2024-04-11 NOTE — Assessment & Plan Note (Signed)
 Physical today 04/11/24.  Colonoscopy 04/2021 - recommended f/u in 5 years.  Mammogram 09/06/23 - Birads I

## 2024-04-11 NOTE — Patient Instructions (Signed)
 Saline nasl spray - flush nose 1-2x/day  Nasacort nasal spray - 2 sprays each nostril one time per day. Do this in the evening.   Robitussin DM twice a day as needed for cough or congestion.

## 2024-04-12 ENCOUNTER — Ambulatory Visit: Payer: Self-pay | Admitting: Internal Medicine

## 2024-04-12 ENCOUNTER — Encounter: Payer: Self-pay | Admitting: Internal Medicine

## 2024-04-12 DIAGNOSIS — R0981 Nasal congestion: Secondary | ICD-10-CM | POA: Insufficient documentation

## 2024-04-12 NOTE — Assessment & Plan Note (Signed)
 Continue on amlodipine  and micardis .  Blood pressure as outlined. Continue current medications.  Follow pressures. No changes in medication today.

## 2024-04-12 NOTE — Assessment & Plan Note (Signed)
 Follow cbc.

## 2024-04-12 NOTE — Assessment & Plan Note (Signed)
Follow vitamin D level.  

## 2024-04-12 NOTE — Assessment & Plan Note (Addendum)
 Continue crestor . Low cholesterol diet and exercise. Follow lipid panel. No changes in medication today.  Lab Results  Component Value Date   CHOL 124 04/09/2024   HDL 48.30 04/09/2024   LDLCALC 65 04/09/2024   TRIG 55.0 04/09/2024   CHOLHDL 3 04/09/2024

## 2024-04-12 NOTE — Assessment & Plan Note (Signed)
 Increased nasal congestion and drainage. Covid test negative. Treat with steroid nasal spray and saline nasal spray as directed. Robitussin DM as directed. Follow. Call with update.

## 2024-04-12 NOTE — Assessment & Plan Note (Signed)
Worked up by urology.  S/p cystoscopy and biopsy.  Per report, checked out ok.  Recommended f/u as needed.

## 2024-04-12 NOTE — Assessment & Plan Note (Signed)
Colonoscopy 04/12/21 - internal hemorrhoids.  Recommended f/u colonoscopy in 5 years.   

## 2024-08-06 ENCOUNTER — Other Ambulatory Visit

## 2024-08-08 ENCOUNTER — Other Ambulatory Visit (INDEPENDENT_AMBULATORY_CARE_PROVIDER_SITE_OTHER)

## 2024-08-08 ENCOUNTER — Other Ambulatory Visit: Payer: Self-pay | Admitting: Internal Medicine

## 2024-08-08 DIAGNOSIS — E559 Vitamin D deficiency, unspecified: Secondary | ICD-10-CM | POA: Diagnosis not present

## 2024-08-08 DIAGNOSIS — D649 Anemia, unspecified: Secondary | ICD-10-CM | POA: Diagnosis not present

## 2024-08-08 DIAGNOSIS — E7800 Pure hypercholesterolemia, unspecified: Secondary | ICD-10-CM | POA: Diagnosis not present

## 2024-08-08 DIAGNOSIS — Z1231 Encounter for screening mammogram for malignant neoplasm of breast: Secondary | ICD-10-CM

## 2024-08-08 LAB — HEPATIC FUNCTION PANEL
ALT: 9 U/L (ref 3–35)
AST: 19 U/L (ref 5–37)
Albumin: 4.2 g/dL (ref 3.5–5.2)
Alkaline Phosphatase: 54 U/L (ref 39–117)
Bilirubin, Direct: 0.2 mg/dL (ref 0.1–0.3)
Total Bilirubin: 0.9 mg/dL (ref 0.2–1.2)
Total Protein: 7.1 g/dL (ref 6.0–8.3)

## 2024-08-08 LAB — TSH: TSH: 2.16 u[IU]/mL (ref 0.35–5.50)

## 2024-08-08 LAB — CBC WITH DIFFERENTIAL/PLATELET
Basophils Absolute: 0 K/uL (ref 0.0–0.1)
Basophils Relative: 0.6 % (ref 0.0–3.0)
Eosinophils Absolute: 0.4 K/uL (ref 0.0–0.7)
Eosinophils Relative: 7.1 % — ABNORMAL HIGH (ref 0.0–5.0)
HCT: 40.7 % (ref 36.0–46.0)
Hemoglobin: 13.2 g/dL (ref 12.0–15.0)
Lymphocytes Relative: 29 % (ref 12.0–46.0)
Lymphs Abs: 1.4 K/uL (ref 0.7–4.0)
MCHC: 32.5 g/dL (ref 30.0–36.0)
MCV: 87.8 fl (ref 78.0–100.0)
Monocytes Absolute: 0.6 K/uL (ref 0.1–1.0)
Monocytes Relative: 12.5 % — ABNORMAL HIGH (ref 3.0–12.0)
Neutro Abs: 2.5 K/uL (ref 1.4–7.7)
Neutrophils Relative %: 50.8 % (ref 43.0–77.0)
Platelets: 259 K/uL (ref 150.0–400.0)
RBC: 4.63 Mil/uL (ref 3.87–5.11)
RDW: 14.7 % (ref 11.5–15.5)
WBC: 5 K/uL (ref 4.0–10.5)

## 2024-08-08 LAB — VITAMIN D 25 HYDROXY (VIT D DEFICIENCY, FRACTURES): VITD: 48.39 ng/mL (ref 30.00–100.00)

## 2024-08-08 LAB — BASIC METABOLIC PANEL WITH GFR
BUN: 16 mg/dL (ref 6–23)
CO2: 32 meq/L (ref 19–32)
Calcium: 9.3 mg/dL (ref 8.4–10.5)
Chloride: 103 meq/L (ref 96–112)
Creatinine, Ser: 0.94 mg/dL (ref 0.40–1.20)
GFR: 59.25 mL/min — ABNORMAL LOW
Glucose, Bld: 90 mg/dL (ref 70–99)
Potassium: 5 meq/L (ref 3.5–5.1)
Sodium: 140 meq/L (ref 135–145)

## 2024-08-08 LAB — LIPID PANEL
Cholesterol: 148 mg/dL (ref 28–200)
HDL: 68.8 mg/dL
LDL Cholesterol: 72 mg/dL (ref 10–99)
NonHDL: 79.32
Total CHOL/HDL Ratio: 2
Triglycerides: 39 mg/dL (ref 10.0–149.0)
VLDL: 7.8 mg/dL (ref 0.0–40.0)

## 2024-08-11 ENCOUNTER — Ambulatory Visit (INDEPENDENT_AMBULATORY_CARE_PROVIDER_SITE_OTHER): Admitting: Internal Medicine

## 2024-08-11 ENCOUNTER — Encounter: Payer: Self-pay | Admitting: Internal Medicine

## 2024-08-11 VITALS — BP 138/84 | HR 81 | Temp 97.5°F | Ht 66.0 in | Wt 200.0 lb

## 2024-08-11 DIAGNOSIS — E7800 Pure hypercholesterolemia, unspecified: Secondary | ICD-10-CM

## 2024-08-11 DIAGNOSIS — E875 Hyperkalemia: Secondary | ICD-10-CM | POA: Insufficient documentation

## 2024-08-11 DIAGNOSIS — Z1231 Encounter for screening mammogram for malignant neoplasm of breast: Secondary | ICD-10-CM

## 2024-08-11 DIAGNOSIS — Z860100 Personal history of colon polyps, unspecified: Secondary | ICD-10-CM | POA: Diagnosis not present

## 2024-08-11 DIAGNOSIS — I10 Essential (primary) hypertension: Secondary | ICD-10-CM | POA: Diagnosis not present

## 2024-08-11 DIAGNOSIS — R319 Hematuria, unspecified: Secondary | ICD-10-CM

## 2024-08-11 DIAGNOSIS — D649 Anemia, unspecified: Secondary | ICD-10-CM

## 2024-08-11 DIAGNOSIS — E559 Vitamin D deficiency, unspecified: Secondary | ICD-10-CM | POA: Diagnosis not present

## 2024-08-11 MED ORDER — ROSUVASTATIN CALCIUM 10 MG PO TABS: 10.0000 mg | ORAL_TABLET | Freq: Every day | ORAL | 3 refills | Status: AC

## 2024-08-11 MED ORDER — TELMISARTAN 80 MG PO TABS: 80.0000 mg | ORAL_TABLET | Freq: Every day | ORAL | 3 refills | Status: AC

## 2024-08-11 NOTE — Progress Notes (Signed)
 "  Subjective:    Patient ID: Janice Floyd, female    DOB: 1950-10-16, 74 y.o.   MRN: [id number]  Patient here for  Chief Complaint  Patient presents with   Medical Management of Chronic Issues    HPI Here for a scheduled follow up - follow up regarding hypertension and hypercholesterolemia. Continues on amlodipine  and micardis . Blood pressures at home averging <130/78. No chest pain or sob. Breathing stable. No abdominal pain or bowel change reported. No blood in urine.    Past Medical History:  Diagnosis Date   Anemia    iron deficient   Depression    Hyperlipemia    Hypertension    Vitamin D  deficiency    Past Surgical History:  Procedure Laterality Date   APPENDECTOMY  1978   COLONOSCOPY WITH PROPOFOL  N/A 07/15/2015   Procedure: COLONOSCOPY WITH PROPOFOL ;  Surgeon: Deward CINDERELLA Piedmont, MD;  Location: [facility] ENDOSCOPY;  Service: Gastroenterology;  Laterality: N/A;   CYSTOSCOPY WITH BIOPSY N/A 04/16/2017   Procedure: CYSTOSCOPY WITH BLADDER NECK BIOPSY AND URETHRAL BIOPSY;  Surgeon: Penne Knee, MD;  Location: [facility] ORS;  Service: Urology;  Laterality: N/A;   MYOMECTOMY     x2   UTERINE FIBROID SURGERY  1978   Family History  Problem Relation Age of Onset   Lung cancer Father    Heart disease Mother        congestive heart failure   Hypertension Mother    Lung cancer Brother    Diabetes Brother    Colon polyps Sister    Breast cancer Neg Hx    Prostate cancer Neg Hx    Kidney cancer Neg Hx    Social History   Socioeconomic History   Marital status: Married    Spouse name: Not on file   Number of children: Not on file   Years of education: Not on file   Highest education level: Some college, no degree  Occupational History   Not on file  Tobacco Use   Smoking status: Former   Smokeless tobacco: Never   Tobacco comments:    quit smoking 1990  Vaping Use   Vaping status: Never Used  Substance and Sexual Activity   Alcohol use: Yes    Alcohol/week: 0.0 standard  drinks of alcohol    Comment: glass of wine per day   Drug use: No   Sexual activity: Not Currently  Other Topics Concern   Not on file  Social History Narrative   Not on file   Social Drivers of Health   Tobacco Use: Medium Risk (08/11/2024)   Patient History    Smoking Tobacco Use: Former    Smokeless Tobacco Use: Never    Passive Exposure: Not on file  Financial Resource Strain: Low Risk (08/07/2024)   Overall Financial Resource Strain (CARDIA)    Difficulty of Paying Living Expenses: Not hard at all  Food Insecurity: No Food Insecurity (08/07/2024)   Epic    Worried About Radiation Protection Practitioner of Food in the Last Year: Never true    Ran Out of Food in the Last Year: Never true  Transportation Needs: No Transportation Needs (08/07/2024)   Epic    Lack of Transportation (Medical): No    Lack of Transportation (Non-Medical): No  Physical Activity: Insufficiently Active (08/07/2024)   Exercise Vital Sign    Days of Exercise per Week: 2 days    Minutes of Exercise per Session: 60 min  Stress: No Stress Concern Present (08/07/2024)  [employer] of Occupational Health - Occupational Stress Questionnaire    Feeling of Stress: Not at all  Social Connections: Moderately Isolated (08/07/2024)   Social Connection and Isolation Panel    Frequency of Communication with Friends and Family: Once a week    Frequency of Social Gatherings with Friends and Family: Once a week    Attends Religious Services: Patient declined    Database Administrator or Organizations: Yes    Attends Banker Meetings: 1 to 4 times per year    Marital Status: Married  Depression (PHQ2-9): Low Risk (08/11/2024)   Depression (PHQ2-9)    PHQ-2 Score: 0  Alcohol Screen: Low Risk (08/07/2024)   Alcohol Screen    Last Alcohol Screening Score (AUDIT): 3  Housing: Low Risk (08/07/2024)   Epic    Unable to Pay for Housing in the Last Year: No    Number of Times Moved in the Last Year: 0    Homeless in the Last Year: No   Utilities: Not on file  Health Literacy: Not on file     Review of Systems  Constitutional:  Negative for appetite change and unexpected weight change.  HENT:  Negative for congestion and sinus pressure.   Respiratory:  Negative for cough, chest tightness and shortness of breath.   Cardiovascular:  Negative for chest pain, palpitations and leg swelling.  Gastrointestinal:  Negative for abdominal pain, diarrhea, nausea and vomiting.  Genitourinary:  Negative for difficulty urinating and dysuria.  Musculoskeletal:  Negative for joint swelling and myalgias.  Skin:  Negative for color change and rash.  Neurological:  Negative for dizziness and headaches.  Psychiatric/Behavioral:  Negative for agitation and dysphoric mood.        Objective:     BP 138/84   Pulse 81   Temp (!) 97.5 F (36.4 C) (Oral)   Ht 5' 6 (1.676 m)   Wt 200 lb (90.7 kg)   LMP 10/15/1999   SpO2 99%   BMI 32.28 kg/m  Wt Readings from Last 3 Encounters:  08/11/24 200 lb (90.7 kg)  04/11/24 194 lb (88 kg)  12/07/23 205 lb 3.2 oz (93.1 kg)    Physical Exam Vitals reviewed.  Constitutional:      General: She is not in acute distress.    Appearance: Normal appearance.  HENT:     Head: Normocephalic and atraumatic.     Right Ear: External ear normal.     Left Ear: External ear normal.     Mouth/Throat:     Pharynx: No oropharyngeal exudate or posterior oropharyngeal erythema.  Eyes:     General: No scleral icterus.       Right eye: No discharge.        Left eye: No discharge.     Conjunctiva/sclera: Conjunctivae normal.  Neck:     Thyroid : No thyromegaly.  Cardiovascular:     Rate and Rhythm: Normal rate and regular rhythm.  Pulmonary:     Effort: No respiratory distress.     Breath sounds: Normal breath sounds. No wheezing.  Abdominal:     General: Bowel sounds are normal.     Palpations: Abdomen is soft.     Tenderness: There is no abdominal tenderness.  Musculoskeletal:        General:  No swelling or tenderness.     Cervical back: Neck supple. No tenderness.  Lymphadenopathy:     Cervical: No cervical adenopathy.  Skin:    Findings: No erythema or  rash.  Neurological:     Mental Status: She is alert.  Psychiatric:        Mood and Affect: Mood normal.        Behavior: Behavior normal.         Outpatient Encounter Medications as of 08/11/2024  Medication Sig   amLODipine  (NORVASC ) 2.5 MG tablet Take 1 tablet (2.5 mg total) by mouth daily.   Cholecalciferol (VITAMIN D ) 2000 units CAPS Take 2,000 Units by mouth daily.   fluticasone  (FLONASE ) 50 MCG/ACT nasal spray Place 1 spray into both nostrils as needed for allergies or rhinitis. (Patient taking differently: Place 2 sprays into both nostrils as needed for allergies or rhinitis.)   vitamin E 400 UNIT capsule Take 400 Units by mouth daily.   rosuvastatin  (CRESTOR ) 10 MG tablet Take 1 tablet (10 mg total) by mouth daily.   telmisartan  (MICARDIS ) 80 MG tablet Take 1 tablet (80 mg total) by mouth daily.   [DISCONTINUED] rosuvastatin  (CRESTOR ) 10 MG tablet Take 1 tablet (10 mg total) by mouth daily.   [DISCONTINUED] telmisartan  (MICARDIS ) 80 MG tablet Take 1 tablet (80 mg total) by mouth daily.   No facility-administered encounter medications on file as of 08/11/2024.     Lab Results  Component Value Date   WBC 5.0 08/08/2024   HGB 13.2 08/08/2024   HCT 40.7 08/08/2024   PLT 259.0 08/08/2024   GLUCOSE 90 08/08/2024   CHOL 148 08/08/2024   TRIG 39.0 08/08/2024   HDL 68.80 08/08/2024   LDLCALC 72 08/08/2024   ALT 9 08/08/2024   AST 19 08/08/2024   NA 140 08/08/2024   K 5.0 08/08/2024   CL 103 08/08/2024   CREATININE 0.94 08/08/2024   BUN 16 08/08/2024   CO2 32 08/08/2024   TSH 2.16 08/08/2024    MM 3D SCREENING MAMMOGRAM BILATERAL BREAST Result Date: 09/07/2023 CLINICAL DATA:  Screening. EXAM: DIGITAL SCREENING BILATERAL MAMMOGRAM WITH TOMOSYNTHESIS AND CAD TECHNIQUE: Bilateral screening digital  craniocaudal and mediolateral oblique mammograms were obtained. Bilateral screening digital breast tomosynthesis was performed. The images were evaluated with computer-aided detection. COMPARISON:  Previous exam(s). ACR Breast Density Category b: There are scattered areas of fibroglandular density. FINDINGS: There are no findings suspicious for malignancy. IMPRESSION: No mammographic evidence of malignancy. A result letter of this screening mammogram will be mailed directly to the patient. RECOMMENDATION: Screening mammogram in one year. (Code:SM-B-01Y) BI-RADS CATEGORY  1: Negative. Electronically Signed   By: Dina  Arceo M.D.   On: 09/07/2023 17:12       Assessment & Plan:  Essential hypertension, benign Assessment & Plan: Currently on amlodipine  and micardis . Blood pressure as outlined. No change in medication. Continue to spot check pressures.    Anemia, unspecified type Assessment & Plan: Hgb just checked 08/08/24 wnl.   Orders: -     CBC with Differential/Platelet; Future  Hypercholesterolemia Assessment & Plan: Continue crestor . Low cholesterol diet and exercise. Discussed results today. Follow lipid panel.  Lab Results  Component Value Date   CHOL 148 08/08/2024   HDL 68.80 08/08/2024   LDLCALC 72 08/08/2024   TRIG 39.0 08/08/2024   CHOLHDL 2 08/08/2024     Orders: -     Basic metabolic panel with GFR; Future -     Hepatic function panel; Future -     Lipid panel; Future  Visit for screening mammogram -     3D Screening Mammogram, Left and Right; Future  Serum potassium elevated Assessment & Plan: Potassium 5 on recent  check. Not using salt substitutes. Recheck potassium in a few weeks to confirm wnl.   Orders: -     Potassium; Future  Hematuria, unspecified type Assessment & Plan: Worked up by urology.  S/p cystoscopy and biopsy.  Per report, checked out ok.  Recommended f/u as needed.  Recheck urine with next labs.   Orders: -     Urinalysis, Routine w reflex  microscopic; Future  Vitamin D  deficiency Assessment & Plan: Vitamin D  level just checked and wnl.    History of colonic polyps Assessment & Plan: Colonoscopy 04/12/21 - internal hemorrhoids.  Recommended f/u colonoscopy in 5 years.     Other orders -     Rosuvastatin  Calcium ; Take 1 tablet (10 mg total) by mouth daily.  Dispense: 90 tablet; Refill: 3 -     Telmisartan ; Take 1 tablet (80 mg total) by mouth daily.  Dispense: 90 tablet; Refill: 3     Allena Hamilton, MD "

## 2024-08-11 NOTE — Assessment & Plan Note (Signed)
 Hgb just checked 08/08/24 wnl.

## 2024-08-11 NOTE — Assessment & Plan Note (Signed)
 Worked up by urology.  S/p cystoscopy and biopsy.  Per report, checked out ok.  Recommended f/u as needed.  Recheck urine with next labs.

## 2024-08-11 NOTE — Assessment & Plan Note (Signed)
Vitamin D level just checked and wnl.

## 2024-08-11 NOTE — Assessment & Plan Note (Signed)
Colonoscopy 04/12/21 - internal hemorrhoids.  Recommended f/u colonoscopy in 5 years.   

## 2024-08-11 NOTE — Assessment & Plan Note (Addendum)
 Currently on amlodipine  and micardis . Blood pressure as outlined. No change in medication. Continue to spot check pressures.

## 2024-08-11 NOTE — Assessment & Plan Note (Signed)
 Continue crestor . Low cholesterol diet and exercise. Discussed results today. Follow lipid panel.  Lab Results  Component Value Date   CHOL 148 08/08/2024   HDL 68.80 08/08/2024   LDLCALC 72 08/08/2024   TRIG 39.0 08/08/2024   CHOLHDL 2 08/08/2024

## 2024-08-11 NOTE — Assessment & Plan Note (Signed)
 Potassium 5 on recent check. Not using salt substitutes. Recheck potassium in a few weeks to confirm wnl.

## 2024-09-01 ENCOUNTER — Telehealth: Payer: Self-pay | Admitting: Internal Medicine

## 2024-09-01 NOTE — Telephone Encounter (Signed)
 Office will not open until 10:00. Left a message to call the office to reschedule.

## 2024-09-02 ENCOUNTER — Other Ambulatory Visit

## 2024-09-09 ENCOUNTER — Ambulatory Visit
Admission: RE | Admit: 2024-09-09 | Discharge: 2024-09-09 | Disposition: A | Source: Ambulatory Visit | Attending: Internal Medicine | Admitting: Internal Medicine

## 2024-09-09 DIAGNOSIS — Z1231 Encounter for screening mammogram for malignant neoplasm of breast: Secondary | ICD-10-CM

## 2024-12-10 ENCOUNTER — Other Ambulatory Visit

## 2024-12-12 ENCOUNTER — Ambulatory Visit: Admitting: Internal Medicine
